# Patient Record
Sex: Female | Born: 1975 | Race: White | Hispanic: No | Smoking: Former smoker
Health system: Southern US, Community
[De-identification: ages and names within clinical notes are randomized; demographics above are authoritative.]

## PROBLEM LIST (undated history)

## (undated) DIAGNOSIS — N189 Chronic kidney disease, unspecified: Secondary | ICD-10-CM

## (undated) DIAGNOSIS — N200 Calculus of kidney: Secondary | ICD-10-CM

## (undated) DIAGNOSIS — E538 Deficiency of other specified B group vitamins: Secondary | ICD-10-CM

## (undated) DIAGNOSIS — R519 Headache, unspecified: Secondary | ICD-10-CM

## (undated) DIAGNOSIS — F419 Anxiety disorder, unspecified: Secondary | ICD-10-CM

## (undated) DIAGNOSIS — Z87891 Personal history of nicotine dependence: Secondary | ICD-10-CM

## (undated) DIAGNOSIS — J40 Bronchitis, not specified as acute or chronic: Secondary | ICD-10-CM

## (undated) DIAGNOSIS — K219 Gastro-esophageal reflux disease without esophagitis: Secondary | ICD-10-CM

## (undated) DIAGNOSIS — F32A Depression, unspecified: Secondary | ICD-10-CM

## (undated) DIAGNOSIS — L732 Hidradenitis suppurativa: Secondary | ICD-10-CM

## (undated) DIAGNOSIS — R7989 Other specified abnormal findings of blood chemistry: Secondary | ICD-10-CM

## (undated) DIAGNOSIS — E559 Vitamin D deficiency, unspecified: Secondary | ICD-10-CM

## (undated) DIAGNOSIS — F329 Major depressive disorder, single episode, unspecified: Secondary | ICD-10-CM

## (undated) DIAGNOSIS — R51 Headache: Secondary | ICD-10-CM

## (undated) HISTORY — PX: KIDNEY STONE SURGERY: SHX686

## (undated) HISTORY — DX: Personal history of nicotine dependence: Z87.891

## (undated) HISTORY — DX: Hidradenitis suppurativa: L73.2

## (undated) HISTORY — DX: Vitamin D deficiency, unspecified: E55.9

## (undated) HISTORY — DX: Other specified abnormal findings of blood chemistry: R79.89

## (undated) HISTORY — PX: CHOLECYSTECTOMY: SHX55

## (undated) HISTORY — DX: Deficiency of other specified B group vitamins: E53.8

## (undated) HISTORY — DX: Depression, unspecified: F32.A

## (undated) HISTORY — DX: Bronchitis, not specified as acute or chronic: J40

---
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
## 1994-06-19 HISTORY — PX: OOPHORECTOMY: SHX86

## 1997-10-26 ENCOUNTER — Emergency Department (HOSPITAL_COMMUNITY): Admission: EM | Admit: 1997-10-26 | Discharge: 1997-10-26 | Payer: Self-pay | Admitting: Emergency Medicine

## 2000-10-28 ENCOUNTER — Emergency Department (HOSPITAL_COMMUNITY): Admission: EM | Admit: 2000-10-28 | Discharge: 2000-10-28 | Payer: Self-pay | Admitting: Emergency Medicine

## 2000-12-26 ENCOUNTER — Emergency Department (HOSPITAL_COMMUNITY): Admission: EM | Admit: 2000-12-26 | Discharge: 2000-12-26 | Payer: Self-pay | Admitting: Internal Medicine

## 2002-05-18 ENCOUNTER — Emergency Department (HOSPITAL_COMMUNITY): Admission: EM | Admit: 2002-05-18 | Discharge: 2002-05-18 | Payer: Self-pay | Admitting: Emergency Medicine

## 2002-08-15 ENCOUNTER — Emergency Department (HOSPITAL_COMMUNITY): Admission: EM | Admit: 2002-08-15 | Discharge: 2002-08-15 | Payer: Self-pay | Admitting: Emergency Medicine

## 2003-02-16 ENCOUNTER — Emergency Department (HOSPITAL_COMMUNITY): Admission: EM | Admit: 2003-02-16 | Discharge: 2003-02-16 | Payer: Self-pay | Admitting: Emergency Medicine

## 2003-08-10 ENCOUNTER — Emergency Department (HOSPITAL_COMMUNITY): Admission: EM | Admit: 2003-08-10 | Discharge: 2003-08-10 | Payer: Self-pay | Admitting: Emergency Medicine

## 2003-09-17 ENCOUNTER — Emergency Department (HOSPITAL_COMMUNITY): Admission: EM | Admit: 2003-09-17 | Discharge: 2003-09-17 | Payer: Self-pay | Admitting: Emergency Medicine

## 2003-11-21 ENCOUNTER — Emergency Department (HOSPITAL_COMMUNITY): Admission: EM | Admit: 2003-11-21 | Discharge: 2003-11-21 | Payer: Self-pay | Admitting: Emergency Medicine

## 2004-09-27 ENCOUNTER — Emergency Department (HOSPITAL_COMMUNITY): Admission: EM | Admit: 2004-09-27 | Discharge: 2004-09-27 | Payer: Self-pay | Admitting: Emergency Medicine

## 2005-03-15 ENCOUNTER — Emergency Department (HOSPITAL_COMMUNITY): Admission: EM | Admit: 2005-03-15 | Discharge: 2005-03-15 | Payer: Self-pay | Admitting: Emergency Medicine

## 2006-05-19 ENCOUNTER — Emergency Department (HOSPITAL_COMMUNITY): Admission: EM | Admit: 2006-05-19 | Discharge: 2006-05-19 | Payer: Self-pay | Admitting: Emergency Medicine

## 2007-01-04 ENCOUNTER — Emergency Department (HOSPITAL_COMMUNITY): Admission: EM | Admit: 2007-01-04 | Discharge: 2007-01-04 | Payer: Self-pay | Admitting: Emergency Medicine

## 2007-04-19 ENCOUNTER — Ambulatory Visit: Payer: Self-pay | Admitting: Internal Medicine

## 2007-05-31 ENCOUNTER — Ambulatory Visit: Payer: Self-pay | Admitting: Internal Medicine

## 2008-02-10 ENCOUNTER — Ambulatory Visit: Payer: Self-pay | Admitting: Unknown Physician Specialty

## 2008-03-26 ENCOUNTER — Emergency Department (HOSPITAL_COMMUNITY): Admission: EM | Admit: 2008-03-26 | Discharge: 2008-03-26 | Payer: Self-pay | Admitting: Emergency Medicine

## 2008-06-12 ENCOUNTER — Emergency Department: Payer: Self-pay | Admitting: Unknown Physician Specialty

## 2008-06-12 ENCOUNTER — Emergency Department (HOSPITAL_COMMUNITY): Admission: EM | Admit: 2008-06-12 | Discharge: 2008-06-12 | Payer: Self-pay | Admitting: Emergency Medicine

## 2008-07-15 ENCOUNTER — Emergency Department (HOSPITAL_COMMUNITY): Admission: EM | Admit: 2008-07-15 | Discharge: 2008-07-15 | Payer: Self-pay | Admitting: Emergency Medicine

## 2008-09-04 ENCOUNTER — Emergency Department: Payer: Self-pay | Admitting: Emergency Medicine

## 2008-11-13 ENCOUNTER — Emergency Department: Payer: Self-pay | Admitting: Emergency Medicine

## 2009-06-04 ENCOUNTER — Emergency Department: Payer: Self-pay | Admitting: Unknown Physician Specialty

## 2009-07-24 ENCOUNTER — Emergency Department (HOSPITAL_COMMUNITY): Admission: EM | Admit: 2009-07-24 | Discharge: 2009-07-24 | Payer: Self-pay | Admitting: Emergency Medicine

## 2009-08-12 ENCOUNTER — Encounter: Payer: Self-pay | Admitting: Unknown Physician Specialty

## 2009-08-17 ENCOUNTER — Encounter: Payer: Self-pay | Admitting: Unknown Physician Specialty

## 2010-01-20 ENCOUNTER — Emergency Department: Payer: Self-pay | Admitting: Internal Medicine

## 2010-07-20 ENCOUNTER — Emergency Department: Payer: Self-pay | Admitting: Emergency Medicine

## 2010-09-07 LAB — URINALYSIS, ROUTINE W REFLEX MICROSCOPIC
Ketones, ur: NEGATIVE mg/dL
Leukocytes, UA: NEGATIVE
Protein, ur: NEGATIVE mg/dL
Specific Gravity, Urine: 1.015 (ref 1.005–1.030)
pH: 7 (ref 5.0–8.0)

## 2010-09-07 LAB — WET PREP, GENITAL: Trich, Wet Prep: NONE SEEN

## 2010-09-07 LAB — URINE MICROSCOPIC-ADD ON

## 2010-09-07 LAB — GC/CHLAMYDIA PROBE AMP, GENITAL
Chlamydia, DNA Probe: NEGATIVE
GC Probe Amp, Genital: NEGATIVE

## 2010-10-03 LAB — RAPID STREP SCREEN (MED CTR MEBANE ONLY): Streptococcus, Group A Screen (Direct): NEGATIVE

## 2010-10-04 ENCOUNTER — Emergency Department: Payer: Self-pay | Admitting: Unknown Physician Specialty

## 2011-03-20 LAB — URINALYSIS, ROUTINE W REFLEX MICROSCOPIC
Bilirubin Urine: NEGATIVE
Nitrite: NEGATIVE
Specific Gravity, Urine: 1.025
Urobilinogen, UA: 0.2

## 2011-03-20 LAB — PREGNANCY, URINE: Preg Test, Ur: NEGATIVE

## 2011-09-22 ENCOUNTER — Emergency Department: Payer: Self-pay | Admitting: Emergency Medicine

## 2012-06-04 ENCOUNTER — Ambulatory Visit: Payer: Self-pay | Admitting: Family Medicine

## 2012-08-20 ENCOUNTER — Ambulatory Visit: Payer: Self-pay | Admitting: Family Medicine

## 2012-08-30 ENCOUNTER — Ambulatory Visit: Payer: Self-pay | Admitting: Family Medicine

## 2012-11-15 ENCOUNTER — Ambulatory Visit: Payer: Self-pay | Admitting: Family Medicine

## 2013-01-13 ENCOUNTER — Ambulatory Visit (INDEPENDENT_AMBULATORY_CARE_PROVIDER_SITE_OTHER): Payer: Commercial Managed Care - PPO | Admitting: Family Medicine

## 2013-01-13 ENCOUNTER — Ambulatory Visit: Payer: Self-pay | Admitting: Family Medicine

## 2013-01-13 ENCOUNTER — Encounter: Payer: Self-pay | Admitting: Family Medicine

## 2013-01-13 VITALS — BP 120/74 | HR 76 | Temp 98.2°F | Ht 63.25 in | Wt 241.0 lb

## 2013-01-13 DIAGNOSIS — L732 Hidradenitis suppurativa: Secondary | ICD-10-CM

## 2013-01-13 DIAGNOSIS — N644 Mastodynia: Secondary | ICD-10-CM

## 2013-01-13 DIAGNOSIS — E559 Vitamin D deficiency, unspecified: Secondary | ICD-10-CM

## 2013-01-13 NOTE — Progress Notes (Signed)
Subjective:    Patient ID: Tanya Henderson, female    DOB: 27-Nov-1975, 37 y.o.   MRN: [id number]  HPI  Very pleasant 37 yo G1P1 here to establish care.  She goes to [facility] (GYN).  Has implanon-  due to be changed early next year.  Vit D deficiency- per pt, h/o very low Vit D.  Just now finishing weekly high dose supplementation and would like it rechecked.  Fatigue has improved.  Right breast pain- on and off for two weeks.  She does not think she feels a mass but breast is tender to palpation. No changes in her nipple or nipple discharge.  No immediate family h/o breast CA- great aunts may have had breast CA.  Abscesses in groin- gets them every so often.  Has been to derm.  Told it was acne.  Can become very painful- can scar.  She thinks it is where her thighs rub together. Has been on oral abx in past for this.  Does not have any active lesions.  Per pt, had CPX at GYN earlier this year- labs done (awaiting records).  Patient Active Problem List   Diagnosis Date Noted  . Unspecified vitamin D deficiency 01/13/2013  . Breast pain, right 01/13/2013  . Hidradenitis 01/13/2013   Past Medical History  Diagnosis Date  . Vitamin D deficiency    Past Surgical History  Procedure Laterality Date  . Cholecystectomy    . Oophorectomy Right 1996    Ovarian cyst   History  Substance Use Topics  . Smoking status: Current Every Day Smoker  . Smokeless tobacco: Not on file  . Alcohol Use: Not on file   No family history on file. Allergies  Allergen Reactions  . Penicillins     Allergy as a child, unsure of reaction   No current outpatient prescriptions on file prior to visit.   No current facility-administered medications on file prior to visit.   The PMH, PSH, Social History, Family History, Medications, and allergies have been reviewed in [facility], and have been updated if relevant.   Review of Systems    See HPI Denies CP or SOB + occasional menstrual  cramping but does not have periods with implanon Objective:   Physical Exam BP 120/74  Pulse 76  Temp(Src) 98.2 F (36.8 C)  Ht 5' 3.25" (1.607 m)  Wt 241 lb (109.317 kg)  BMI 42.33 kg/m2  General:  Well-developed,well-nourished,in no acute distress; alert,appropriate and cooperative throughout examination Head:  normocephalic and atraumatic.   Eyes:  vision grossly intact, pupils equal, pupils round, and pupils reactive to light.   Ears:  R ear normal and L ear normal.   Nose:  no external deformity.   Mouth:  good dentition.   Neck:  No deformities, masses, or tenderness noted. Breasts:  No mass, nodules, thickening, tenderness, bulging, retraction, inflamation, nipple discharge or skin changes noted.   Right chest wall a little TTP Lungs:  Normal respiratory effort, chest expands symmetrically. Lungs are clear to auscultation, no crackles or wheezes. Heart:  Normal rate and regular rhythm. S1 and S2 normal without gallop, murmur, click, rub or other extra sounds. Extremities:  No clubbing, cyanosis, edema, or deformity noted with normal full range of motion of all joints.   Neurologic:  alert & oriented X3 and gait normal.   Skin:  Intact without suspicious lesions or rashes Cervical Nodes:  No lymphadenopathy noted Axillary Nodes:  No palpable lymphadenopathy Psych:  Cognition and judgment appear  intact. Alert and cooperative with normal attention span and concentration. No apparent delusions, illusions, hallucinations      Assessment & Plan:  1. Unspecified vitamin D deficiency Recheck labs today. - Vitamin D, 25-hydroxy  2. Breast pain, right I suspect this is costochondritis/MSK discomfort but will order bilateral diagnostic mammogram to rule out breast pathology. The patient indicates understanding of these issues and agrees with the plan.  - MM Digital Diagnostic Bilat; Future  3. Hidradenitis No current lesions. See AVS for supportive care.

## 2013-01-13 NOTE — Addendum Note (Signed)
Addended by: Dianne Dun on: 01/13/2013 02:44 PM   Modules accepted: Orders

## 2013-01-13 NOTE — Patient Instructions (Addendum)
Good to see you. We will call you with your Vit D results.  Please stop by to see Shirlee Limerick on your way out to set up your referral.   Hidradenitis Suppurativa, Sweat Gland Abscess Hidradenitis suppurativa is a long lasting (chronic), uncommon disease of the sweat glands. With this, boil-like lumps and scarring develop in the groin, some times under the arms (axillae), and under the breasts. It may also uncommonly occur behind the ears, in the crease of the buttocks, and around the genitals.  CAUSES  The cause is from a blocking of the sweat glands. They then become infected. It may cause drainage and odor. It is not contagious. So it cannot be given to someone else. It most often shows up in puberty (about 4 to 38 years of age). But it may happen much later. It is similar to acne which is a disease of the sweat glands. This condition is slightly more common in African-Americans and women. SYMPTOMS   Hidradenitis usually starts as one or more red, tender, swellings in the groin or under the arms (axilla).  Over a period of hours to days the lesions get larger. They often open to the skin surface, draining clear to yellow-colored fluid.  The infected area heals with scarring. DIAGNOSIS  Your caregiver makes this diagnosis by looking at you. Sometimes cultures (growing germs on plates in the lab) may be taken. This is to see what germ (bacterium) is causing the infection.  TREATMENT   Topical germ killing medicine applied to the skin (antibiotics) are the treatment of choice. Antibiotics taken by mouth (systemic) are sometimes needed when the condition is getting worse or is severe.  Avoid tight-fitting clothing which traps moisture in.  Dirt does not cause hidradenitis and it is not caused by poor hygiene.  Involved areas should be cleaned daily using an antibacterial soap. Some patients find that the liquid form of Lever 2000, applied to the involved areas as a lotion after bathing, can  help reduce the odor related to this condition.  Sometimes surgery is needed to drain infected areas or remove scarred tissue. Removal of large amounts of tissue is used only in severe cases.  Birth control pills may be helpful.  Oral retinoids (vitamin A derivatives) for 6 to 12 months which are effective for acne may also help this condition.  Weight loss will improve but not cure hidradenitis. It is made worse by being overweight. But the condition is not caused by being overweight.  This condition is more common in people who have had acne.  It may become worse under stress. There is no medical cure for hidradenitis. It can be controlled, but not cured. The condition usually continues for years with periods of getting worse and getting better (remission). Document Released: 01/18/2004 Document Revised: 08/28/2011 Document Reviewed: 02/03/2008 [facility] Patient Information 2014 [place], [place].

## 2013-01-14 ENCOUNTER — Encounter: Payer: Self-pay | Admitting: Family Medicine

## 2013-01-14 ENCOUNTER — Ambulatory Visit: Payer: Self-pay | Admitting: Family Medicine

## 2013-01-15 ENCOUNTER — Encounter: Payer: Self-pay | Admitting: Family Medicine

## 2013-01-16 ENCOUNTER — Encounter: Payer: Self-pay | Admitting: Family Medicine

## 2013-01-20 ENCOUNTER — Other Ambulatory Visit: Payer: Self-pay | Admitting: Family Medicine

## 2013-01-20 DIAGNOSIS — R928 Other abnormal and inconclusive findings on diagnostic imaging of breast: Secondary | ICD-10-CM

## 2013-01-21 ENCOUNTER — Telehealth: Payer: Self-pay | Admitting: *Deleted

## 2013-01-21 NOTE — Telephone Encounter (Signed)
Advised patient as instructed. 

## 2013-01-21 NOTE — Telephone Encounter (Signed)
Agreed.  They felt it was a benign finding but wanted close follow up.  The more she touches the area, the more sore it will become.

## 2013-01-21 NOTE — Telephone Encounter (Signed)
Spoke with patient regarding her mammogram.  She's concerned that she will not have follow up scan until 3 or 4 months from now.  She states they told her that we would need to refer her if she needs scan before then.  I told her that they are allowing time to see if nodes increase or decrease in size, that it may take that long for any changes to be seen.  She also reports that the area is more painful, tender now. I suggested that it may be that she is just more aware of the pain now and she may be stressing more about it.

## 2013-01-30 ENCOUNTER — Ambulatory Visit: Payer: Commercial Managed Care - PPO | Admitting: Family Medicine

## 2013-03-24 ENCOUNTER — Emergency Department: Payer: Self-pay | Admitting: Emergency Medicine

## 2013-03-24 LAB — CBC
HCT: 43.6 % (ref 35.0–47.0)
MCV: 89 fL (ref 80–100)
Platelet: 281 10*3/uL (ref 150–440)
RBC: 4.89 10*6/uL (ref 3.80–5.20)

## 2013-03-24 LAB — MONONUCLEOSIS SCREEN: Mono Test: NEGATIVE

## 2013-04-24 ENCOUNTER — Other Ambulatory Visit: Payer: Self-pay

## 2013-05-27 ENCOUNTER — Encounter: Payer: Self-pay | Admitting: Internal Medicine

## 2013-05-27 ENCOUNTER — Ambulatory Visit (INDEPENDENT_AMBULATORY_CARE_PROVIDER_SITE_OTHER): Payer: Commercial Managed Care - PPO | Admitting: Internal Medicine

## 2013-05-27 VITALS — BP 120/90 | HR 94 | Temp 98.0°F | Wt 237.0 lb

## 2013-05-27 DIAGNOSIS — B379 Candidiasis, unspecified: Secondary | ICD-10-CM

## 2013-05-27 DIAGNOSIS — J029 Acute pharyngitis, unspecified: Secondary | ICD-10-CM

## 2013-05-27 MED ORDER — FLUCONAZOLE 150 MG PO TABS: 150.0000 mg | ORAL_TABLET | Freq: Every day | ORAL | Status: DC

## 2013-05-27 MED ORDER — CEFDINIR 300 MG PO CAPS: 300.0000 mg | ORAL_CAPSULE | Freq: Two times a day (BID) | ORAL | Status: DC

## 2013-05-27 NOTE — Patient Instructions (Signed)

## 2013-05-27 NOTE — Progress Notes (Signed)
Pre-visit discussion using our clinic review tool. No additional management support is needed unless otherwise documented below in the visit note.  

## 2013-05-27 NOTE — Progress Notes (Signed)
HPI  Pt presents to the clinic today with c/o right ear pain and swollen tonsil R>L. This started about 1 week ago. This has been happening on and off since October. She tested Strep A and Mono. She denies fever, chills or body aches. She has tried Ibuprofen OTC which has helped.  Review of Systems      Past Medical History  Diagnosis Date  . Vitamin D deficiency     History reviewed. No pertinent family history.  History   Social History  . Marital Status: Legally Separated    Spouse Name: N/A    Number of Children: N/A  . Years of Education: N/A   Occupational History  . Not on file.   Social History Main Topics  . Smoking status: Current Every Day Smoker  . Smokeless tobacco: Never Used  . Alcohol Use: Yes  . Drug Use: No  . Sexual Activity: Not on file   Other Topics Concern  . 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    Allergies  Allergen Reactions  . Penicillins     Allergy as a child, unsure of reaction     Constitutional: Positive headache, fatigue. Denies fever or abrupt weight changes.  HEENT:  Positive sore throat. Denies eye redness, eye pain, pressure behind the eyes, facial pain, nasal congestion, ear pain, ringing in the ears, wax buildup, runny nose or bloody nose. Respiratory:  Denies cough, difficulty breathing or shortness of breath.  Cardiovascular: Denies chest pain, chest tightness, palpitations or swelling in the hands or feet.   No other specific complaints in a complete review of systems (except as listed in HPI above).  Objective:   BP 120/90  Pulse 94  Temp(Src) 98 F (36.7 C) (Tympanic)  Wt 237 lb (107.502 kg)  SpO2 98% Wt Readings from Last 3 Encounters:  05/27/13 237 lb (107.502 kg)  01/13/13 241 lb (109.317 kg)     General: Appears her stated age, well developed, well nourished in NAD. HEENT: Head: normal shape and size; Eyes: sclera white, no icterus, conjunctiva pink, PERRLA and  EOMs intact; Ears: Tm's gray and intact, normal light reflex; Nose: mucosa pink and moist, septum midline; Throat/Mouth: + PND. Teeth present, mucosa erythematous and moist, white patchy exudate noted on bilateral tonsillar pillars, no lesions or ulcerations noted.  Neck: Mild tonsillar lymphadenopathy. Neck supple, trachea midline. No massses, lumps or thyromegaly present.  Cardiovascular: Normal rate and rhythm. S1,S2 noted.  No murmur, rubs or gallops noted. No JVD or BLE edema. No carotid bruits noted. Pulmonary/Chest: Normal effort and positive vesicular breath sounds. No respiratory distress. No wheezes, rales or ronchi noted.      Assessment & Plan:   Acute pharyngitis, likely bacterial:  Get some rest and drink plenty of water Do salt water gargles for the sore throat eRx for Omnicef BID x 10 days eRx for diflucan 150 mg for antibiotic induced yeast infection  RTC as needed or if symptoms persist.

## 2013-05-28 ENCOUNTER — Encounter: Payer: Self-pay | Admitting: Family Medicine

## 2013-05-28 ENCOUNTER — Ambulatory Visit: Payer: Self-pay | Admitting: Family Medicine

## 2013-06-03 ENCOUNTER — Telehealth: Payer: Self-pay

## 2013-06-03 NOTE — Telephone Encounter (Signed)
Pt left v/m was seen 05/27/13 and omnicef was given;white spots are coming back on tonsils and pt request Zpak sent to [facility]. Pt request cb.

## 2013-06-03 NOTE — Telephone Encounter (Signed)
zpack typically will not treat pharyngitis. If she is not  Better she needs to be reevaluated

## 2013-06-04 NOTE — Telephone Encounter (Signed)
spoke to pt and she made a f/u appt for 12/19

## 2013-06-06 ENCOUNTER — Ambulatory Visit (INDEPENDENT_AMBULATORY_CARE_PROVIDER_SITE_OTHER): Payer: Commercial Managed Care - PPO | Admitting: Internal Medicine

## 2013-06-06 ENCOUNTER — Encounter: Payer: Self-pay | Admitting: Internal Medicine

## 2013-06-06 VITALS — BP 130/72 | HR 98 | Temp 98.3°F | Ht 64.0 in | Wt 235.8 lb

## 2013-06-06 DIAGNOSIS — L259 Unspecified contact dermatitis, unspecified cause: Secondary | ICD-10-CM

## 2013-06-06 DIAGNOSIS — J029 Acute pharyngitis, unspecified: Secondary | ICD-10-CM

## 2013-06-06 DIAGNOSIS — L239 Allergic contact dermatitis, unspecified cause: Secondary | ICD-10-CM

## 2013-06-06 MED ORDER — TRIAMCINOLONE ACETONIDE 0.1 % EX CREA: 1.0000 "application " | TOPICAL_CREAM | Freq: Two times a day (BID) | CUTANEOUS | Status: DC

## 2013-06-06 MED ORDER — PREDNISONE 10 MG PO TABS: ORAL_TABLET | ORAL | Status: DC

## 2013-06-06 NOTE — Progress Notes (Signed)
Subjective:    Patient ID: Tanya Henderson, female    DOB: Mar 28, 1976, 37 y.o.   MRN: [id number]  HPI  Pt presents to the clinic today with c/o continued white spots on her right tonsils, continued swelling and intermittent pain. She did take her entire course of omnicef. She denies fever, chills or body aches. She has not taken anything in addition to the antibiotic. She seems to get frequent tonsillar infections. She has not been evaluated by ENT. She also c/o a rash in bilateral groins and around her labia. She did develop a yeast infection while on the antibiotic. She did take the diflucan and the discharge went away but she still reports some irritation around the labia. She has not put anything on the rash on her groin. She reports that it looks to her like an allergic reaction.  Review of Systems      Past Medical History  Diagnosis Date  . Vitamin D deficiency     Current Outpatient Prescriptions  Medication Sig Dispense Refill  . cefdinir (OMNICEF) 300 MG capsule Take 1 capsule (300 mg total) by mouth 2 (two) times daily.  20 capsule  0  . fluconazole (DIFLUCAN) 150 MG tablet Take 1 tablet (150 mg total) by mouth daily.  1 tablet  0   No current facility-administered medications for this visit.    Allergies  Allergen Reactions  . Penicillins     Allergy as a child, unsure of reaction    History reviewed. No pertinent family history.  History   Social History  . Marital Status: Legally Separated    Spouse Name: N/A    Number of Children: N/A  . Years of Education: N/A   Occupational History  . Not on file.   Social History Main Topics  . Smoking status: Current Every Day Smoker  . Smokeless tobacco: Never Used  . Alcohol Use: Yes  . Drug Use: No  . Sexual Activity: Not on file   Other Topics Concern  . 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     Constitutional: Denies fever, malaise, fatigue, headache  or abrupt weight changes.  HEENT: Denies eye pain, eye redness, ear pain, ringing in the ears, wax buildup, runny nose, nasal congestion, bloody nose. GU: Denies urgency, frequency, pain with urination, burning sensation, blood in urine, odor or discharge. Skin: Denies redness,  lesions or ulcercations.    No other specific complaints in a complete review of systems (except as listed in HPI above).  Objective:   Physical Exam  BP 130/72  Pulse 98  Temp(Src) 98.3 F (36.8 C) (Tympanic)  Ht 5\' 4"  (1.626 m)  Wt 235 lb 12 oz (106.935 kg)  BMI 40.45 kg/m2  SpO2 99% Wt Readings from Last 3 Encounters:  06/06/13 235 lb 12 oz (106.935 kg)  05/27/13 237 lb (107.502 kg)  01/13/13 241 lb (109.317 kg)    General: Appears her stated age, obese but well developed, well nourished in NAD. Skin: Warm, dry and intact. No lesions or ulcerations noted. Allergic dermatitis noted in bilateral groins. Cardiovascular: Normal rate and rhythm. S1,S2 noted.  No murmur, rubs or gallops noted. No JVD or BLE edema. No carotid bruits noted. Pulmonary/Chest: Normal effort and positive vesicular breath sounds. No respiratory distress. No wheezes, rales or ronchi noted.  GU:  Inner labia noted to be paler than outer labia. This is new per pt. No  lesions, ulcerations, irritation or discharge noted.     Assessment & Plan:   Pharyngitis, unresolved:  Finishing up Omnicef at this time Will try pred taper to help with the inflammation If this is not successful, will refer you to ENT for further evaluation  Rash, bilateral groins:  Not candida Looks to be dermatitis Take a benadryl OTC I will give you some triamcinolone cream to apply to the affected area  RTC as needed or if symptoms persist or worsen

## 2013-06-06 NOTE — Progress Notes (Signed)
Pre-visit discussion using our clinic review tool. No additional management support is needed unless otherwise documented below in the visit note.  

## 2013-06-06 NOTE — Patient Instructions (Addendum)

## 2013-06-18 ENCOUNTER — Ambulatory Visit: Payer: Commercial Managed Care - PPO | Admitting: Internal Medicine

## 2013-07-11 ENCOUNTER — Ambulatory Visit (INDEPENDENT_AMBULATORY_CARE_PROVIDER_SITE_OTHER): Payer: Commercial Managed Care - PPO | Admitting: Internal Medicine

## 2013-07-11 ENCOUNTER — Encounter: Payer: Self-pay | Admitting: Internal Medicine

## 2013-07-11 VITALS — BP 126/82 | HR 82 | Temp 98.4°F | Wt 240.2 lb

## 2013-07-11 DIAGNOSIS — N898 Other specified noninflammatory disorders of vagina: Secondary | ICD-10-CM

## 2013-07-11 DIAGNOSIS — N760 Acute vaginitis: Secondary | ICD-10-CM

## 2013-07-11 DIAGNOSIS — B9689 Other specified bacterial agents as the cause of diseases classified elsewhere: Secondary | ICD-10-CM

## 2013-07-11 DIAGNOSIS — A499 Bacterial infection, unspecified: Secondary | ICD-10-CM

## 2013-07-11 LAB — POCT WET PREP (WET MOUNT): CLUE CELLS WET PREP WHIFF POC: POSITIVE

## 2013-07-11 NOTE — Progress Notes (Signed)
Pre-visit discussion using our clinic review tool. No additional management support is needed unless otherwise documented below in the visit note.  

## 2013-07-11 NOTE — Patient Instructions (Signed)

## 2013-07-11 NOTE — Progress Notes (Signed)
   Subjective:    Patient ID: Tanya Henderson, female    DOB: 03/26/1976, 38 y.o.   MRN: [id number]  HPI  Pt presents to the clinic today with c/o discharge and odor. This started 2 days ago. The discharge is thin and white. She has had some lower abdominal cramping but denies any burning or itching. She did go to GYN yesterday to have her Nexplanon replaced yesterday. She did tell her GYN about the discharge and she went ahead and called her in some Metrogel. She has not picked it up because she did not want to take something without knowing for sure what it is.  Review of Systems      Past Medical History  Diagnosis Date  . Vitamin D deficiency     Current Outpatient Prescriptions  Medication Sig Dispense Refill  . triamcinolone cream (KENALOG) 0.1 % Apply 1 application topically 2 (two) times daily.  30 g  0   No current facility-administered medications for this visit.    Allergies  Allergen Reactions  . Penicillins     Allergy as a child, unsure of reaction    No family history on file.  History   Social History  . Marital Status: Legally Separated    Spouse Name: N/A    Number of Children: N/A  . Years of Education: N/A   Occupational History  . Not on file.   Social History Main Topics  . Smoking status: Current Every Day Smoker  . Smokeless tobacco: Never Used  . Alcohol Use: Yes  . Drug Use: No  . Sexual Activity: Not on file   Other Topics Concern  . 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     Constitutional: Denies fever, malaise, fatigue, headache or abrupt weight changes.  GU: Pt reports vaginal discharge with odor. Denies urgency, frequency, pain with urination, burning sensation, blood in urine.   No other specific complaints in a complete review of systems (except as listed in HPI above).  Objective:   Physical Exam  BP 126/82  Pulse 82  Temp(Src) 98.4 F (36.9 C) (Oral)  Wt 240 lb 4 oz  (108.977 kg)  SpO2 98% Wt Readings from Last 3 Encounters:  07/11/13 240 lb 4 oz (108.977 kg)  06/06/13 235 lb 12 oz (106.935 kg)  05/27/13 237 lb (107.502 kg)    General: Appears her stated age, well developed, well nourished in NAD. Cardiovascular: Normal rate and rhythm. S1,S2 noted.  No murmur, rubs or gallops noted. No JVD or BLE edema. No carotid bruits noted. Pulmonary/Chest: Normal effort and positive vesicular breath sounds. No respiratory distress. No wheezes, rales or ronchi noted.  Abdomen: Soft and nontender. Normal bowel sounds, no bruits noted. No distention or masses noted. Liver, spleen and kidneys non palpable. Pelvic: Small amount of thin white discharge noted, negative whiff.      Assessment & Plan:   Vaginal discharge with odor secondary to BV:  Wet prep obtained: + clue cells, negative yeast or trich Metrogel has already been ordered by GYN Use as directed and RTC if you continue to have issues/concerns

## 2013-07-14 ENCOUNTER — Telehealth: Payer: Self-pay | Admitting: Family Medicine

## 2013-07-14 NOTE — Telephone Encounter (Signed)
Relevant patient education assigned to patient using Emmi. ° °

## 2013-12-16 DIAGNOSIS — F32A Depression, unspecified: Secondary | ICD-10-CM

## 2013-12-16 DIAGNOSIS — K219 Gastro-esophageal reflux disease without esophagitis: Secondary | ICD-10-CM | POA: Insufficient documentation

## 2013-12-16 DIAGNOSIS — G43909 Migraine, unspecified, not intractable, without status migrainosus: Secondary | ICD-10-CM

## 2013-12-16 DIAGNOSIS — K8020 Calculus of gallbladder without cholecystitis without obstruction: Secondary | ICD-10-CM

## 2013-12-16 DIAGNOSIS — F329 Major depressive disorder, single episode, unspecified: Secondary | ICD-10-CM | POA: Insufficient documentation

## 2013-12-16 HISTORY — DX: Calculus of gallbladder without cholecystitis without obstruction: K80.20

## 2013-12-16 HISTORY — DX: Migraine, unspecified, not intractable, without status migrainosus: G43.909

## 2013-12-16 HISTORY — DX: Gastro-esophageal reflux disease without esophagitis: K21.9

## 2013-12-16 HISTORY — DX: Depression, unspecified: F32.A

## 2014-08-10 ENCOUNTER — Encounter: Payer: Commercial Managed Care - PPO | Admitting: Family Medicine

## 2014-10-14 ENCOUNTER — Other Ambulatory Visit: Payer: Self-pay | Admitting: Obstetrics and Gynecology

## 2014-10-14 DIAGNOSIS — Z1231 Encounter for screening mammogram for malignant neoplasm of breast: Secondary | ICD-10-CM

## 2014-11-25 ENCOUNTER — Ambulatory Visit: Payer: Commercial Managed Care - PPO | Attending: Obstetrics and Gynecology

## 2015-01-28 ENCOUNTER — Emergency Department
Admission: EM | Admit: 2015-01-28 | Discharge: 2015-01-29 | Disposition: A | Discharge status: Home / Self Care | Payer: Commercial Indemnity | Attending: Emergency Medicine | Admitting: Emergency Medicine

## 2015-01-28 DIAGNOSIS — Z6841 Body Mass Index (BMI) 40.0 and over, adult: Secondary | ICD-10-CM | POA: Diagnosis not present

## 2015-01-28 DIAGNOSIS — Q512 Other doubling of uterus: Secondary | ICD-10-CM | POA: Diagnosis not present

## 2015-01-28 DIAGNOSIS — E669 Obesity, unspecified: Secondary | ICD-10-CM | POA: Insufficient documentation

## 2015-01-28 DIAGNOSIS — R112 Nausea with vomiting, unspecified: Secondary | ICD-10-CM

## 2015-01-28 DIAGNOSIS — N132 Hydronephrosis with renal and ureteral calculous obstruction: Secondary | ICD-10-CM | POA: Diagnosis not present

## 2015-01-28 DIAGNOSIS — R109 Unspecified abdominal pain: Secondary | ICD-10-CM | POA: Insufficient documentation

## 2015-01-28 DIAGNOSIS — Z90721 Acquired absence of ovaries, unilateral: Secondary | ICD-10-CM | POA: Diagnosis not present

## 2015-01-28 DIAGNOSIS — N201 Calculus of ureter: Secondary | ICD-10-CM

## 2015-01-28 DIAGNOSIS — E559 Vitamin D deficiency, unspecified: Secondary | ICD-10-CM | POA: Insufficient documentation

## 2015-01-28 DIAGNOSIS — N200 Calculus of kidney: Secondary | ICD-10-CM

## 2015-01-28 DIAGNOSIS — Z9049 Acquired absence of other specified parts of digestive tract: Secondary | ICD-10-CM | POA: Insufficient documentation

## 2015-01-28 DIAGNOSIS — F17200 Nicotine dependence, unspecified, uncomplicated: Secondary | ICD-10-CM | POA: Diagnosis not present

## 2015-01-28 DIAGNOSIS — R1084 Generalized abdominal pain: Secondary | ICD-10-CM | POA: Diagnosis present

## 2015-01-28 DIAGNOSIS — L732 Hidradenitis suppurativa: Secondary | ICD-10-CM | POA: Diagnosis not present

## 2015-01-28 LAB — URINALYSIS COMPLETE WITH MICROSCOPIC (ARMC ONLY)
Bilirubin Urine: NEGATIVE
Glucose, UA: NEGATIVE mg/dL
Leukocytes, UA: NEGATIVE
Nitrite: NEGATIVE
PH: 5 (ref 5.0–8.0)
Protein, ur: 30 mg/dL — AB
Specific Gravity, Urine: 1.02 (ref 1.005–1.030)
WBC UA: NONE SEEN WBC/hpf (ref 0–5)

## 2015-01-28 LAB — COMPREHENSIVE METABOLIC PANEL
ALK PHOS: 104 U/L (ref 38–126)
ALT: 46 U/L (ref 14–54)
ANION GAP: 9 (ref 5–15)
AST: 32 U/L (ref 15–41)
Albumin: 4.2 g/dL (ref 3.5–5.0)
BILIRUBIN TOTAL: 0.5 mg/dL (ref 0.3–1.2)
BUN: 12 mg/dL (ref 6–20)
CHLORIDE: 103 mmol/L (ref 101–111)
CO2: 23 mmol/L (ref 22–32)
Calcium: 8.9 mg/dL (ref 8.9–10.3)
Creatinine, Ser: 0.86 mg/dL (ref 0.44–1.00)
GFR calc non Af Amer: >60 mL/min (ref >60)
GLUCOSE: 133 mg/dL — AB (ref 65–99)
Potassium: 3.8 mmol/L (ref 3.5–5.1)
Sodium: 135 mmol/L (ref 135–145)
TOTAL PROTEIN: 7.8 g/dL (ref 6.5–8.1)

## 2015-01-28 LAB — CBC WITH DIFFERENTIAL/PLATELET
BASOS PCT: 1 %
Basophils Absolute: 0.1 10*3/uL (ref 0–0.1)
Eosinophils Absolute: 0.2 10*3/uL (ref 0–0.7)
Eosinophils Relative: 1 %
HCT: 44.9 % (ref 35.0–47.0)
HEMOGLOBIN: 15.1 g/dL (ref 12.0–16.0)
Lymphocytes Relative: 13 %
Lymphs Abs: 2 10*3/uL (ref 1.0–3.6)
MCH: 30 pg (ref 26.0–34.0)
MCHC: 33.5 g/dL (ref 32.0–36.0)
MCV: 89.5 fL (ref 80.0–100.0)
MONOS PCT: 6 %
Monocytes Absolute: 0.8 10*3/uL (ref 0.2–0.9)
NEUTROS ABS: 11.7 10*3/uL — AB (ref 1.4–6.5)
Neutrophils Relative %: 79 %
Platelets: 287 10*3/uL (ref 150–440)
RBC: 5.02 MIL/uL (ref 3.80–5.20)
RDW: 13.2 % (ref 11.5–14.5)
WBC: 14.8 10*3/uL — ABNORMAL HIGH (ref 3.6–11.0)

## 2015-01-28 LAB — LIPASE, BLOOD: Lipase: 28 U/L (ref 22–51)

## 2015-01-28 LAB — PREGNANCY, URINE: PREG TEST UR: NEGATIVE

## 2015-01-28 MED ORDER — ONDANSETRON 4 MG PO TBDP
ORAL_TABLET | ORAL | Status: DC
Start: 2015-01-28 — End: 2015-01-29
  Filled 2015-01-28: qty 1

## 2015-01-28 MED ORDER — OXYCODONE-ACETAMINOPHEN 5-325 MG PO TABS
1.0000 | ORAL_TABLET | Freq: Once | ORAL | Status: AC
  Administered 2015-01-28: 1 via ORAL

## 2015-01-28 MED ORDER — ONDANSETRON 4 MG PO TBDP
4.0000 mg | ORAL_TABLET | Freq: Once | ORAL | Status: AC
  Administered 2015-01-28: 4 mg via ORAL

## 2015-01-28 MED ORDER — OXYCODONE-ACETAMINOPHEN 5-325 MG PO TABS
ORAL_TABLET | ORAL | Status: AC
  Filled 2015-01-28: qty 1

## 2015-01-28 NOTE — ED Notes (Signed)
Right sided abd pain since yest no hx of the same

## 2015-01-29 ENCOUNTER — Emergency Department: Payer: Commercial Indemnity | Admitting: Anesthesiology

## 2015-01-29 ENCOUNTER — Emergency Department: Payer: Commercial Indemnity

## 2015-01-29 ENCOUNTER — Encounter
Admission: EM | Disposition: A | Discharge status: Home / Self Care | Payer: Self-pay | Source: Home / Self Care | Attending: Emergency Medicine

## 2015-01-29 ENCOUNTER — Encounter: Payer: Self-pay | Admitting: Emergency Medicine

## 2015-01-29 HISTORY — PX: CYSTOSCOPY WITH STENT PLACEMENT: SHX5790

## 2015-01-29 SURGERY — CYSTOSCOPY, WITH STENT INSERTION
Anesthesia: General | Laterality: Right

## 2015-01-29 MED ORDER — SODIUM CHLORIDE 0.9 % IR SOLN
Status: DC | PRN
  Administered 2015-01-29: 500 mL

## 2015-01-29 MED ORDER — PROPOFOL 10 MG/ML IV BOLUS
INTRAVENOUS | Status: DC | PRN
  Administered 2015-01-29: 200 mg via INTRAVENOUS

## 2015-01-29 MED ORDER — LIDOCAINE HCL (CARDIAC) 20 MG/ML IV SOLN
INTRAVENOUS | Status: DC | PRN
  Administered 2015-01-29: 100 mg via INTRAVENOUS

## 2015-01-29 MED ORDER — SODIUM CHLORIDE 0.9 % IV SOLN
INTRAVENOUS | Status: DC | PRN
  Administered 2015-01-29: 05:00:00 via INTRAVENOUS

## 2015-01-29 MED ORDER — MIDAZOLAM HCL 2 MG/2ML IJ SOLN
INTRAMUSCULAR | Status: DC | PRN
  Administered 2015-01-29: 1 mg via INTRAVENOUS

## 2015-01-29 MED ORDER — FENTANYL CITRATE (PF) 100 MCG/2ML IJ SOLN
25.0000 ug | INTRAMUSCULAR | Status: AC | PRN
  Administered 2015-01-29 (×6): 25 ug via INTRAVENOUS

## 2015-01-29 MED ORDER — ONDANSETRON HCL 4 MG/2ML IJ SOLN
4.0000 mg | Freq: Once | INTRAMUSCULAR | Status: AC
  Administered 2015-01-29: 4 mg via INTRAVENOUS
  Filled 2015-01-29: qty 2

## 2015-01-29 MED ORDER — SODIUM CHLORIDE 0.9 % IJ SOLN
INTRAMUSCULAR | Status: DC | PRN
  Administered 2015-01-29: 5 mL

## 2015-01-29 MED ORDER — NEOSTIGMINE METHYLSULFATE 10 MG/10ML IV SOLN
INTRAVENOUS | Status: DC | PRN
  Administered 2015-01-29: 4 mg via INTRAVENOUS

## 2015-01-29 MED ORDER — HYDROMORPHONE HCL 1 MG/ML IJ SOLN
INTRAMUSCULAR | Status: AC
  Filled 2015-01-29: qty 1

## 2015-01-29 MED ORDER — ROCURONIUM BROMIDE 100 MG/10ML IV SOLN
INTRAVENOUS | Status: DC | PRN
  Administered 2015-01-29: 30 mg via INTRAVENOUS

## 2015-01-29 MED ORDER — TROSPIUM CHLORIDE ER 60 MG PO CP24
60.0000 mg | ORAL_CAPSULE | Freq: Every day | ORAL | Status: DC
Start: 2015-01-29 — End: 2015-02-10

## 2015-01-29 MED ORDER — SODIUM CHLORIDE 0.9 % IV BOLUS (SEPSIS)
1000.0000 mL | Freq: Once | INTRAVENOUS | Status: AC
  Administered 2015-01-29: 1000 mL via INTRAVENOUS
  Filled 2015-01-29: qty 1000

## 2015-01-29 MED ORDER — FENTANYL CITRATE (PF) 100 MCG/2ML IJ SOLN
INTRAMUSCULAR | Status: AC
  Filled 2015-01-29: qty 2

## 2015-01-29 MED ORDER — ACETAMINOPHEN-CODEINE #3 300-30 MG PO TABS
ORAL_TABLET | ORAL | Status: AC
  Administered 2015-01-29: 1
  Filled 2015-01-29: qty 1

## 2015-01-29 MED ORDER — PHENAZOPYRIDINE HCL 200 MG PO TABS: 200.0000 mg | ORAL_TABLET | Freq: Three times a day (TID) | ORAL | Status: DC | PRN

## 2015-01-29 MED ORDER — ONDANSETRON HCL 4 MG/2ML IJ SOLN: 4.0000 mg | Freq: Once | INTRAMUSCULAR | Status: DC | PRN

## 2015-01-29 MED ORDER — GLYCOPYRROLATE 0.2 MG/ML IJ SOLN
INTRAMUSCULAR | Status: DC | PRN
  Administered 2015-01-29: 0.3 mg via INTRAVENOUS
  Administered 2015-01-29: .6 mg via INTRAVENOUS

## 2015-01-29 MED ORDER — HYDROMORPHONE HCL 1 MG/ML IJ SOLN
1.0000 mg | Freq: Once | INTRAMUSCULAR | Status: AC
  Administered 2015-01-29: 1 mg via INTRAVENOUS

## 2015-01-29 MED ORDER — ONDANSETRON HCL 4 MG/2ML IJ SOLN
INTRAMUSCULAR | Status: DC | PRN
  Administered 2015-01-29: 4 mg via INTRAVENOUS

## 2015-01-29 MED ORDER — MORPHINE SULFATE 4 MG/ML IJ SOLN
INTRAMUSCULAR | Status: AC
  Administered 2015-01-29: 4 mg
  Filled 2015-01-29: qty 1

## 2015-01-29 MED ORDER — CIPROFLOXACIN IN D5W 400 MG/200ML IV SOLN
400.0000 mg | Freq: Once | INTRAVENOUS | Status: AC
  Administered 2015-01-29: 400 mg via INTRAVENOUS
  Filled 2015-01-29: qty 200

## 2015-01-29 MED ORDER — ONDANSETRON HCL 4 MG/2ML IJ SOLN
INTRAMUSCULAR | Status: AC
  Administered 2015-01-29: 4 mg
  Filled 2015-01-29: qty 2

## 2015-01-29 MED ORDER — IOHEXOL 300 MG/ML  SOLN
125.0000 mL | Freq: Once | INTRAMUSCULAR | Status: AC | PRN
  Administered 2015-01-29: 125 mL via INTRAVENOUS

## 2015-01-29 MED ORDER — HYDROMORPHONE HCL 1 MG/ML IJ SOLN
1.0000 mg | Freq: Once | INTRAMUSCULAR | Status: AC
  Administered 2015-01-29: 1 mg via INTRAVENOUS
  Filled 2015-01-29: qty 1

## 2015-01-29 MED ORDER — IOHEXOL 240 MG/ML SOLN
25.0000 mL | Freq: Once | INTRAMUSCULAR | Status: AC | PRN
  Administered 2015-01-29: 25 mL via ORAL

## 2015-01-29 MED ORDER — ACETAMINOPHEN-CODEINE #3 300-30 MG PO TABS: 1.0000 | ORAL_TABLET | ORAL | Status: DC | PRN

## 2015-01-29 MED ORDER — FENTANYL CITRATE (PF) 100 MCG/2ML IJ SOLN
INTRAMUSCULAR | Status: DC
Start: 2015-01-29 — End: 2015-01-29
  Filled 2015-01-29: qty 2

## 2015-01-29 SURGICAL SUPPLY — 18 items
BAG DRAIN CYSTO-URO LG1000N (MISCELLANEOUS) ×3 IMPLANT
CATH URETL 5X70 OPEN END (CATHETERS) ×3 IMPLANT
GLOVE BIO SURGEON STRL SZ 6.5 (GLOVE) ×2 IMPLANT
GLOVE BIO SURGEON STRL SZ7 (GLOVE) ×3 IMPLANT
GLOVE BIO SURGEONS STRL SZ 6.5 (GLOVE) ×1
GOWN STRL REUS W/ TWL LRG LVL3 (GOWN DISPOSABLE) ×2 IMPLANT
GOWN STRL REUS W/TWL LRG LVL3 (GOWN DISPOSABLE) ×4
JELLY LUB 2OZ STRL (MISCELLANEOUS) ×2
JELLY LUBE 2OZ STRL (MISCELLANEOUS) ×1 IMPLANT
PACK CYSTO AR (MISCELLANEOUS) ×3 IMPLANT
PREP PVP WINGED SPONGE (MISCELLANEOUS) ×3 IMPLANT
SENSORWIRE 0.038 NOT ANGLED (WIRE) ×3
SET CYSTO W/LG BORE CLAMP LF (SET/KITS/TRAYS/PACK) ×3 IMPLANT
SOL .9 NS 3000ML IRR  AL (IV SOLUTION) ×2
SOL .9 NS 3000ML IRR UROMATIC (IV SOLUTION) ×1 IMPLANT
STENT URET 6FRX24 CONTOUR (STENTS) ×3 IMPLANT
WATER STERILE IRR 1000ML POUR (IV SOLUTION) ×3 IMPLANT
WIRE SENSOR 0.038 NOT ANGLED (WIRE) ×1 IMPLANT

## 2015-01-29 NOTE — Anesthesia Postprocedure Evaluation (Signed)
  Anesthesia Post-op Note  Patient: Tanya Henderson  Procedure(s) Performed: Procedure(s): CYSTOSCOPY WITH STENT PLACEMENT (Right)  Anesthesia type:General  Patient location: PACU  Post pain: Pain level controlled  Post assessment: Post-op Vital signs reviewed, Patient's Cardiovascular Status Stable, Respiratory Function Stable, Patent Airway and No signs of Nausea or vomiting  Post vital signs: Reviewed and stable  Last Vitals:  Filed Vitals:   01/29/15 0655  BP: 143/83  Pulse: 50  Temp: 36.3 C  Resp:     Level of consciousness: awake, alert  and patient cooperative  Complications: No apparent anesthesia complications

## 2015-01-29 NOTE — ED Notes (Signed)
Pt drinking contrast for CT. 

## 2015-01-29 NOTE — Anesthesia Procedure Notes (Signed)
Procedure Name: Intubation Date/Time: 01/29/2015 4:48 AM Performed by: Shirlee Limerick, Shannon Kirkendall Pre-anesthesia Checklist: Patient identified, Emergency Drugs available, Suction available and Patient being monitored Patient Re-evaluated:Patient Re-evaluated prior to inductionOxygen Delivery Method: Circle system utilized Preoxygenation: Pre-oxygenation with 100% oxygen Intubation Type: IV induction Laryngoscope Size: Mac and 3 Grade View: Grade I Tube type: Oral Tube size: 7.0 mm Placement Confirmation: ETT inserted through vocal cords under direct vision,  positive ETCO2 and breath sounds checked- equal and bilateral Secured at: 21 cm Tube secured with: Tape Dental Injury: Teeth and Oropharynx as per pre-operative assessment

## 2015-01-29 NOTE — ED Notes (Signed)
Pt taken to OR per md order, consent form signed and site marker per md

## 2015-01-29 NOTE — Transfer of Care (Signed)
Immediate Anesthesia Transfer of Care Note  Patient: Tanya Henderson  Procedure(s) Performed: Procedure(s): CYSTOSCOPY WITH STENT PLACEMENT (Right)  Patient Location: PACU  Anesthesia Type:General  Level of Consciousness: awake, alert , oriented and patient cooperative  Airway & Oxygen Therapy: Patient Spontanous Breathing and Patient connected to nasal cannula oxygen  Post-op Assessment: Report given to RN and Post -op Vital signs reviewed and stable  Post vital signs: Reviewed and stable  Last Vitals:  Filed Vitals:   01/29/15 0514  BP: 161/97  Pulse:   Temp: 36.2 C  Resp: 15    Complications: No apparent anesthesia complications

## 2015-01-29 NOTE — Discharge Instructions (Signed)
DISCHARGE INSTRUCTIONS FOR KIDNEY STONE/URETERAL STENT   MEDICATIONS:  1.  Resume all your other meds from home - except do not take any extra narcotic pain meds that you may have at home.  2. Trospium is to prevent bladder spasms and help reduce urinary frequency. 3. Pyridium is to help with the burning/stinging when you urinate. 4. Tylenol with codeine is for moderate/severe pain, otherwise taking upto  every 6 hours of plainTylenol will help treat your pain.  Do not take both at the same time.   ACTIVITY:  1. No strenuous activity x 1week  2. No driving while on narcotic pain medications  3. Drink plenty of water  4. Continue to walk at home - you can still get blood clots when you are at home, so keep active, but don't over do it.  5. May return to work/school tomorrow or when you feel ready   BATHING:  1. You can shower and we recommend daily showers    SIGNS/SYMPTOMS TO CALL:  Please call us if you have a fever greater than 101.5, uncontrolled nausea/vomiting, uncontrolled pain, dizziness, unable to urinate, bloody urine, chest pain, shortness of breath, leg swelling, leg pain, redness around wound, drainage from wound, or any other concerns or questions.   You can reach [place] at [phone].   FOLLOW-UP:  1. The patient will be contacted by [facility] for a follow-up appointment next week. AMBULATORY SURGERY  DISCHARGE INSTRUCTIONS   1) The drugs that you were given will stay in your system until tomorrow so for the next 24 hours you should not:  A) Drive an automobile B) Make any legal decisions C) Drink any alcoholic beverage   2) You may resume regular meals tomorrow.  Today it is better to start with liquids and gradually work up to solid foods.  You may eat anything you prefer, but it is better to start with liquids, then soup and crackers, and gradually work up to solid foods.   3) Please notify your doctor immediately if you have any  unusual bleeding, trouble breathing, redness and pain at the surgery site, drainage, fever, or pain not relieved by medication.    4) Additional Instructions:        Please contact your physician with any problems or Same Day Surgery at [phone], Monday through Friday 6 am to 4 pm, or [employer] at [facility] number at [phone].

## 2015-01-29 NOTE — H&P (Signed)
I have been asked to see the patient by Dr. Chiquita Loth, MD, for evaluation and management of right ureteral stone.  History of present illness: 39 year old female who presented  to the emergency department with acute onset right flank pain yesterday evening. The patient's symptoms began approximately 5 pm yesterday.  She noted intense right flank pain/pressure.  Her pain is 10/10, radiates around to her right suprapubic region, and is assoicated with nausea/vomitting.  She denies any dysuria or gross hematuria.  She has not had any fevers or chills.  She has no significant GU history - never had kidney stones prior.  In the emergency department the patient underwent a CT scan which demonstrated a right proximal ureteral stone with associated hydronephrosis.  Her WBC was slightly elevated,  Her renal function was normal, her UA did not demonstrate evidence of infection.  Her pain was difficult to control with IV pain medications.  Review of systems: A 12 point comprehensive review of systems was obtained and is negative unless otherwise stated in the history of present illness.  Patient Active Problem List   Diagnosis Date Noted  . Unspecified vitamin D deficiency 01/13/2013  . Hidradenitis 01/13/2013    No current facility-administered medications on file prior to encounter.   Current Outpatient Prescriptions on File Prior to Encounter  Medication Sig Dispense Refill  . triamcinolone cream (KENALOG) 0.1 % Apply 1 application topically 2 (two) times daily. 30 g 0    Past Medical History  Diagnosis Date  . Vitamin D deficiency     Past Surgical History  Procedure Laterality Date  . Cholecystectomy    . Oophorectomy Right 1996    Ovarian cyst    Social History  Substance Use Topics  . Smoking status: Current Every Day Smoker  . Smokeless tobacco: Never Used  . Alcohol Use: Yes    No family history on file.  PE: Filed Vitals:   01/28/15 2105 01/29/15 0005 01/29/15 0131  01/29/15 0307  BP: 143/86 139/90 133/85 129/83  Pulse: 59 64 66 69  Temp: 97.5 F (36.4 C)     TempSrc: Oral     Resp: 18 18 18 16   Height: 5\' 3"  (1.6 m)     Weight: 108.863 kg (240 lb)     SpO2: 98% 98%  95%   Patient appears to be in no acute distress  patient is alert and oriented x3 Atraumatic normocephalic head No cervical or supraclavicular lymphadenopathy appreciated No increased work of breathing, no audible wheezes/rhonchi Regular sinus rhythm/rate Abdomen is soft, nontender, nondistended Right CVA and lower quadrant pain Lower extremities are symmetric without appreciable edema Grossly neurologically intact No identifiable skin lesions   Recent Labs  01/28/15 2114  WBC 14.8*  HGB 15.1  HCT 44.9    Recent Labs  01/28/15 2114  NA 135  K 3.8  CL 103  CO2 23  GLUCOSE 133*  BUN 12  CREATININE 0.86  CALCIUM 8.9   No results for input(s): LABPT, INR in the last 72 hours. No results for input(s): LABURIN in the last 72 hours. Results for orders placed or performed during the hospital encounter of 07/24/09  GC/chlamydia probe amp, genital     Status: None   Collection Time: 07/24/09  9:50 AM  Result Value Ref Range Status   GC Probe Amp, Genital  NEGATIVE Final    NEGATIVE (NOTE)  Testing performed using the BD Probetec ET Chlamydia trachomatis and Neisseria gonorrhea amplified DNA assay.   Chlamydia, DNA  Probe  NEGATIVE Final    NEGATIVE (NOTE)  Testing performed using the BD Probetec ET Chlamydia trachomatis and Neisseria gonorrhea amplified DNA assay.  Wet prep, genital     Status: Abnormal   Collection Time: 07/24/09  9:50 AM  Result Value Ref Range Status   Yeast Wet Prep HPF POC NONE SEEN NONE SEEN Final   Trich, Wet Prep NONE SEEN NONE SEEN Final   Clue Cells Wet Prep HPF POC FEW (A) NONE SEEN Final   WBC, Wet Prep HPF POC FEW (A) NONE SEEN Final    Imaging: I've individually reviewed the patient's CT scan which demonstrates a 7 mm right  proximal ureteral stone with associated hydronephrosis.  Imp: The patient has a right proximal ureteral stone with poorly controlled pain.  Recommendations: I discussed the management options with this patient which include continued medical expulsion therapy or placement of a double-J right ureteral stent urgently. Given the patient has poorly controlled pain, I recommended that we proceed to the operating room and place a stent. We discussed the procedure in great detail. I went over the risks and benefits. The patient understands that following the operation she will need an additional procedure for her stone. She also understands that her pain is likely to improve significantly, although she will have some stent discomfort. The patient will be discharged home today following the operation and will follow-up with [facility] next week.   Berniece Salines W

## 2015-01-29 NOTE — Op Note (Signed)
Preoperative diagnosis:  1. Right ureteral stone   Postoperative diagnosis:  1. same   Procedure:  1. Cystoscopy 2. right ureteral stent placement 3. right retrograde pyelography with interpretation   Surgeon: Crist Fat, MD  Anesthesia: General  Complications: None  Intraoperative findings: Retrograde pyelogram demonstrated a normal caliber right ureter up to the point of a filling defect in the proximal ureter consistent with the patient's known stone. She had associated proximal hydronephrosis.  On cystoscopy, the patient had a hypervascular bladder, predominantly venules resulting in a strikingly blue tint to her bladder.  EBL: Minimal  Specimens: None  Indication: Tanya Henderson is a 39 y.o. patient with poorly controlled pain secondary to a 7 mm proximal right ureteral stone. After reviewing the management options for treatment, he elected to proceed with the above surgical procedure(s). We have discussed the potential benefits and risks of the procedure, side effects of the proposed treatment, the likelihood of the patient achieving the goals of the procedure, and any potential problems that might occur during the procedure or recuperation. Informed consent has been obtained.  Description of procedure:  The patient was taken to the operating room and general anesthesia was induced.  The patient was placed in the dorsal lithotomy position, prepped and draped in the usual sterile fashion, and preoperative antibiotics were administered. A preoperative time-out was performed.   Cystourethroscopy was performed.  The patient's urethra was examined and was normal. The bladder was then systematically examined in its entirety. There was no evidence for any bladder tumors, stones, or other mucosal pathology.    Attention then turned to the rightureteral orifice and a ureteral catheter was used to intubate the ureteral orifice.  Omnipaque contrast was injected through  the ureteral catheter and a retrograde pyelogram was performed with findings as dictated above.  A 0.38 sensor guidewire was then advanced up the right ureter into the renal pelvis under fluoroscopic guidance.  The wire was then backloaded through the cystoscope and a ureteral stent was advance over the wire using Seldinger technique.  The stent was positioned appropriately under fluoroscopic and cystoscopic guidance.  The wire was then removed with an adequate stent curl noted in the renal pelvis as well as in the bladder.  The bladder was then emptied and the procedure ended.  The patient appeared to tolerate the procedure well and without complications.  The patient was able to be awakened and transferred to the recovery unit in satisfactory condition.    Crist Fat, M.D.

## 2015-01-29 NOTE — ED Notes (Signed)
Right sided abd pain since Wednesday worse today, no diarrhea, has nausea no vomiting, no fever. Pt has no hx of the same, no dysuria.

## 2015-01-29 NOTE — Anesthesia Preprocedure Evaluation (Addendum)
Anesthesia Evaluation  Patient identified by MRN, date of birth, ID band Patient awake    Reviewed: Allergy & Precautions, NPO status , Patient's Chart, lab work & pertinent test results, reviewed documented beta blocker date and time   History of Anesthesia Complications (+) MALIGNANT HYPERTHERMIA  Airway Mallampati: II  TM Distance: >3 FB     Dental  (+) Chipped   Pulmonary Current Smoker,          Cardiovascular     Neuro/Psych Anxiety    GI/Hepatic   Endo/Other    Renal/GU      Musculoskeletal   Abdominal   Peds  Hematology   Anesthesia Other Findings Obesity.  Reproductive/Obstetrics                            Anesthesia Physical Anesthesia Plan  ASA: II  Anesthesia Plan: General   Post-op Pain Management:    Induction: Intravenous  Airway Management Planned: Oral ETT  Additional Equipment:   Intra-op Plan:   Post-operative Plan:   Informed Consent: I have reviewed the patients History and Physical, chart, labs and discussed the procedure including the risks, benefits and alternatives for the proposed anesthesia with the patient or authorized representative who has indicated his/her understanding and acceptance.     Plan Discussed with: CRNA  Anesthesia Plan Comments:         Anesthesia Quick Evaluation

## 2015-01-29 NOTE — ED Notes (Signed)
Urologist in to evaluate

## 2015-01-29 NOTE — ED Provider Notes (Signed)
[facility] Emergency Department Provider Note  ____________________________________________  Time seen: Approximately 12:27 AM  I have reviewed the triage vital signs and the nursing notes.   HISTORY  Chief Complaint Abdominal Pain    HPI Tanya Henderson is a 39 y.o. female who presents to the ED from home with a 2 day history of abdominal pain. Patient reports right sided abdominal pain which was intermittent yesterday; pain has become progressive and constant today. Patient describes 10/10 sharp pain starting in her epigastrium which radiates diffusely, especially to her right lower quadrant and wrapping around to her right flank. Symptoms are associated with nausea only. Last bowel movement was yesterday which was normal. Denies fever, chills, chest pain, shortness of breath, dysuria, diarrhea. Denies recent travel or trauma. Denies being on her period. Nothing makes the pain better or worse.   Past Medical History  Diagnosis Date  . Vitamin D deficiency     Patient Active Problem List   Diagnosis Date Noted  . Unspecified vitamin D deficiency 01/13/2013  . Hidradenitis 01/13/2013    Past Surgical History  Procedure Laterality Date  . Cholecystectomy    . Oophorectomy Right 1996    Ovarian cyst    Current Outpatient Rx  Name  Route  Sig  Dispense  Refill  . Acetaminophen 500 MG coapsule   Oral   Take 1 capsule by mouth every 6 (six) hours as needed.         Marland Kitchen ibuprofen (ADVIL,MOTRIN) 200 MG tablet   Oral   Take 1 tablet by mouth every 6 (six) hours as needed.         . triamcinolone cream (KENALOG) 0.1 %   Topical   Apply 1 application topically 2 (two) times daily.   30 g   0     Allergies Penicillins  Family history Kidney stones  Social History Social History  Substance Use Topics  . Smoking status: Current Every Day Smoker  . Smokeless tobacco: Never Used  . Alcohol Use: Yes    Review of  Systems Constitutional: No fever/chills Eyes: No visual changes. ENT: No sore throat. Cardiovascular: Denies chest pain. Respiratory: Denies shortness of breath. Gastrointestinal: Positive for  abdominal pain.   Positive for nausea, no vomiting.  No diarrhea.  No constipation. Genitourinary: Negative for dysuria. Musculoskeletal: Negative for back pain. Skin: Negative for rash. Neurological: Negative for headaches, focal weakness or numbness.  10-point ROS otherwise negative.  ____________________________________________   PHYSICAL EXAM:  VITAL SIGNS: ED Triage Vitals  Enc Vitals Group     BP 01/28/15 2105 143/86 mmHg     Pulse Rate 01/28/15 2105 59     Resp 01/28/15 2105 18     Temp 01/28/15 2105 97.5 F (36.4 C)     Temp Source 01/28/15 2105 Oral     SpO2 01/28/15 2105 98 %     Weight 01/28/15 2105 240 lb (108.863 kg)     Height 01/28/15 2105  (1.6 m)     Head Cir --      Peak Flow --      Pain Score 01/28/15 2106 10     Pain Loc --      Pain Edu? --      Excl. in GC? --     Constitutional: Alert and oriented. Well appearing and in moderate acute distress. Eyes: Conjunctivae are normal. PERRL. EOMI. Head: Atraumatic. Nose: No congestion/rhinnorhea. Mouth/Throat: Mucous membranes are moist.  Oropharynx non-erythematous. Neck: No stridor.  Cardiovascular: Normal rate, regular rhythm. Grossly normal heart sounds.  Good peripheral circulation. Respiratory: Normal respiratory effort.  No retractions. Lungs CTAB. Gastrointestinal: Soft, tender to palpation diffusely without rebound or guarding. No distention. No abdominal bruits. No CVA tenderness. Musculoskeletal: No lower extremity tenderness nor edema.  No joint effusions. Neurologic:  Normal speech and language. No gross focal neurologic deficits are appreciated. No gait instability. Skin:  Skin is warm, dry and intact. No rash noted. Psychiatric: Mood and affect are normal. Speech and behavior are  normal.  ____________________________________________   LABS (all labs ordered are listed, but only abnormal results are displayed)  Labs Reviewed  CBC WITH DIFFERENTIAL/PLATELET - Abnormal; Notable for the following:    WBC 14.8 (*)    Neutro Abs 11.7 (*)    All other components within normal limits  COMPREHENSIVE METABOLIC PANEL - Abnormal; Notable for the following:    Glucose, Bld 133 (*)    All other components within normal limits  URINALYSIS COMPLETEWITH MICROSCOPIC ([facility] ONLY) - Abnormal; Notable for the following:    Color, Urine YELLOW (*)    APPearance HAZY (*)    Ketones, ur 1+ (*)    Hgb urine dipstick 3+ (*)    Protein, ur 30 (*)    Bacteria, UA RARE (*)    Squamous Epithelial / LPF 6-30 (*)    All other components within normal limits  LIPASE, BLOOD  PREGNANCY, URINE   ____________________________________________  EKG  None ____________________________________________  RADIOLOGY  CT abdomen pelvis interpreted per Dr. Cherly Hensen: 1. Mild right-sided hydronephrosis, with right-sided perinephric stranding and fluid. Obstructing 7 x 7 mm stone noted in the proximal right ureter, just below the right renal pelvis. 2. 3 mm nonobstructing stone at the lower pole of the left kidney. 3. Apparent complete septate uterus noted. 4. Minimal diverticulosis along the proximal sigmoid colon, without evidence of diverticulitis.  ____________________________________________   PROCEDURES  Procedure(s) performed: None  Critical Care performed:   CRITICAL CARE Performed by: Irean Hong   Total critical care time: 30 minutes  Critical care time was exclusive of separately billable procedures and treating other patients.  Critical care was necessary to treat or prevent imminent or life-threatening deterioration.  Critical care was time spent personally by me on the following activities: development of treatment plan with patient and/or surrogate as well as nursing,  discussions with consultants, evaluation of patient's response to treatment, examination of patient, obtaining history from patient or surrogate, ordering and performing treatments and interventions, ordering and review of laboratory studies, ordering and review of radiographic studies, pulse oximetry and re-evaluation of patient's condition.  ____________________________________________   INITIAL IMPRESSION / ASSESSMENT AND PLAN / ED COURSE  Pertinent labs & imaging results that were available during my care of the patient were reviewed by me and considered in my medical decision making (see chart for details).  39 year old female who presents with abdominal pain and nausea. Labs notable for leukocytosis and hematuria. Patient has no personal history for kidney stones. Given patient's diffuse pain and atypical story for renal colic, we'll proceed with CT abdomen/pelvis to evaluate infectious/inflammatory etiology. IV analgesia and antiemetic ordered for symptomatic relief of pain and nausea.  ----------------------------------------- 2:59 AM on 01/29/2015 -----------------------------------------  Pain returning. Will administer her third dose of IV analgesia. Updated patient and mother of CT findings for obstructive stone.Will discuss case with urology.  ----------------------------------------- 3:04 AM on 01/29/2015 -----------------------------------------  Discussed case with Dr. Marlou Porch from urology who will admit patient for stent placement.  ____________________________________________   FINAL CLINICAL IMPRESSION(S) / ED DIAGNOSES  Final diagnoses:  Generalized abdominal pain  Kidney stone on right side  Hydronephrosis with urinary obstruction due to ureteral calculus  Intractable pain    Irean Hong, MD 01/29/15 [id number]

## 2015-01-29 NOTE — ED Notes (Signed)
Pt awaiting for Dr Marlou Porch to go to OR, pt up to bathroom and in gown.

## 2015-02-01 ENCOUNTER — Emergency Department: Payer: Commercial Indemnity

## 2015-02-01 ENCOUNTER — Emergency Department
Admission: EM | Admit: 2015-02-01 | Discharge: 2015-02-01 | Disposition: A | Discharge status: Home / Self Care | Payer: Commercial Indemnity | Attending: Emergency Medicine | Admitting: Emergency Medicine

## 2015-02-01 ENCOUNTER — Encounter: Payer: Self-pay | Admitting: *Deleted

## 2015-02-01 DIAGNOSIS — Z880 Allergy status to penicillin: Secondary | ICD-10-CM | POA: Diagnosis not present

## 2015-02-01 DIAGNOSIS — Z720 Tobacco use: Secondary | ICD-10-CM | POA: Diagnosis not present

## 2015-02-01 DIAGNOSIS — Z79899 Other long term (current) drug therapy: Secondary | ICD-10-CM | POA: Diagnosis not present

## 2015-02-01 DIAGNOSIS — Z7952 Long term (current) use of systemic steroids: Secondary | ICD-10-CM | POA: Insufficient documentation

## 2015-02-01 DIAGNOSIS — N23 Unspecified renal colic: Secondary | ICD-10-CM | POA: Diagnosis not present

## 2015-02-01 DIAGNOSIS — N200 Calculus of kidney: Secondary | ICD-10-CM | POA: Diagnosis present

## 2015-02-01 DIAGNOSIS — Z792 Long term (current) use of antibiotics: Secondary | ICD-10-CM | POA: Diagnosis not present

## 2015-02-01 LAB — URINALYSIS COMPLETE WITH MICROSCOPIC (ARMC ONLY): Specific Gravity, Urine: 1.025 (ref 1.005–1.030)

## 2015-02-01 LAB — BASIC METABOLIC PANEL
Anion gap: 10 (ref 5–15)
BUN: 12 mg/dL (ref 6–20)
CO2: 22 mmol/L (ref 22–32)
Calcium: 9.2 mg/dL (ref 8.9–10.3)
Chloride: 106 mmol/L (ref 101–111)
Creatinine, Ser: 0.84 mg/dL (ref 0.44–1.00)
Glucose, Bld: 93 mg/dL (ref 65–99)
POTASSIUM: 3.5 mmol/L (ref 3.5–5.1)
SODIUM: 138 mmol/L (ref 135–145)

## 2015-02-01 LAB — CBC WITH DIFFERENTIAL/PLATELET
BASOS ABS: 0.1 10*3/uL (ref 0–0.1)
Basophils Relative: 1 %
Eosinophils Absolute: 0.4 10*3/uL (ref 0–0.7)
Eosinophils Relative: 3 %
HCT: 48 % — ABNORMAL HIGH (ref 35.0–47.0)
Hemoglobin: 15.7 g/dL (ref 12.0–16.0)
LYMPHS ABS: 2.1 10*3/uL (ref 1.0–3.6)
Lymphocytes Relative: 17 %
MCH: 29.3 pg (ref 26.0–34.0)
MCHC: 32.7 g/dL (ref 32.0–36.0)
MCV: 89.4 fL (ref 80.0–100.0)
MONO ABS: 0.9 10*3/uL (ref 0.2–0.9)
Monocytes Relative: 7 %
Neutro Abs: 9.1 10*3/uL — ABNORMAL HIGH (ref 1.4–6.5)
Neutrophils Relative %: 72 %
Platelets: 319 10*3/uL (ref 150–440)
RBC: 5.37 MIL/uL — ABNORMAL HIGH (ref 3.80–5.20)
RDW: 13.3 % (ref 11.5–14.5)
WBC: 12.6 10*3/uL — ABNORMAL HIGH (ref 3.6–11.0)

## 2015-02-01 MED ORDER — ONDANSETRON HCL 4 MG/2ML IJ SOLN
4.0000 mg | Freq: Once | INTRAMUSCULAR | Status: AC
  Administered 2015-02-01: 4 mg via INTRAVENOUS
  Filled 2015-02-01: qty 2

## 2015-02-01 MED ORDER — CIPROFLOXACIN HCL 500 MG PO TABS: 500.0000 mg | ORAL_TABLET | Freq: Two times a day (BID) | ORAL | Status: DC

## 2015-02-01 MED ORDER — SODIUM CHLORIDE 0.9 % IV BOLUS (SEPSIS)
500.0000 mL | Freq: Once | INTRAVENOUS | Status: AC
  Administered 2015-02-01: 500 mL via INTRAVENOUS

## 2015-02-01 MED ORDER — HYDROMORPHONE HCL 2 MG PO TABS
2.0000 mg | ORAL_TABLET | Freq: Once | ORAL | Status: AC
Start: 2015-02-01 — End: 2015-02-01
  Administered 2015-02-01: 2 mg via ORAL
  Filled 2015-02-01: qty 1

## 2015-02-01 MED ORDER — HYDROMORPHONE HCL 2 MG PO TABS: 2.0000 mg | ORAL_TABLET | Freq: Two times a day (BID) | ORAL | Status: DC | PRN

## 2015-02-01 MED ORDER — KETOROLAC TROMETHAMINE 30 MG/ML IJ SOLN
30.0000 mg | Freq: Once | INTRAMUSCULAR | Status: AC
  Administered 2015-02-01: 30 mg via INTRAVENOUS
  Filled 2015-02-01: qty 1

## 2015-02-01 NOTE — ED Provider Notes (Signed)
Time Seen: Approximately 1620  I have reviewed the triage notes  Chief Complaint: Nephrolithiasis   History of Present Illness: Tanya Henderson is a 39 y.o. female who recently had a right renal stent placed due to a large calculus. Patient has a 7 mm proximal ureteral stone. The patient had her stent placed on August 12. She states that since her stents in place she's had some increasing right-sided pain. She denies any fever at home she has had some nausea. She was given Tylenol with Codeine for pain which did not seem to help her discomfort. She is requesting to have the stent removed. She denies any bowel issues. She denies any objective fever at home. She has been on prior radium which has made her urine orange in appearance.   Past Medical History  Diagnosis Date  . Vitamin D deficiency     Patient Active Problem List   Diagnosis Date Noted  . Unspecified vitamin D deficiency 01/13/2013  . Hidradenitis 01/13/2013    Past Surgical History  Procedure Laterality Date  . Cholecystectomy    . Oophorectomy Right 1996    Ovarian cyst  . Cystoscopy with stent placement Right 01/29/2015    Procedure: CYSTOSCOPY WITH STENT PLACEMENT;  Surgeon: Crist Fat, MD;  Location: [facility] ORS;  Service: Urology;  Laterality: Right;    Past Surgical History  Procedure Laterality Date  . Cholecystectomy    . Oophorectomy Right 1996    Ovarian cyst  . Cystoscopy with stent placement Right 01/29/2015    Procedure: CYSTOSCOPY WITH STENT PLACEMENT;  Surgeon: Crist Fat, MD;  Location: [facility] ORS;  Service: Urology;  Laterality: Right;    Current Outpatient Rx  Name  Route  Sig  Dispense  Refill  . Acetaminophen 500 MG coapsule   Oral   Take 1 capsule by mouth every 6 (six) hours as needed.         Marland Kitchen acetaminophen-codeine (TYLENOL #3) 300-30 MG per tablet   Oral   Take 1 tablet by mouth every 4 (four) hours as needed.   20 tablet   0   . ciprofloxacin (CIPRO) 500  MG tablet   Oral   Take 1 tablet (500 mg total) by mouth 2 (two) times daily.   6 tablet   0   . HYDROmorphone (DILAUDID) 2 MG tablet   Oral   Take 1 tablet (2 mg total) by mouth every 12 (twelve) hours as needed for severe pain.   20 tablet   0   . phenazopyridine (PYRIDIUM) 200 MG tablet   Oral   Take 1 tablet (200 mg total) by mouth 3 (three) times daily as needed for pain.   10 tablet   0   . triamcinolone cream (KENALOG) 0.1 %   Topical   Apply 1 application topically 2 (two) times daily.   30 g   0   . Trospium Chloride 60 MG CP24   Oral   Take 1 capsule (60 mg total) by mouth daily.   7 each   0     Allergies:  Penicillins  Family History: No family history on file.  Social History: Social History  Substance Use Topics  . Smoking status: Current Every Day Smoker -- 1.00 packs/day  . Smokeless tobacco: Never Used  . Alcohol Use: Yes     Review of Systems:   10 point review of systems was performed and was otherwise negative:  Constitutional: No fever Eyes: No visual disturbances ENT:  No sore throat, ear pain Cardiac: No chest pain Respiratory: No shortness of breath, wheezing, or stridor Abdomen: Pain mainly located in the right flank region No abdominal pain, no vomiting, No diarrhea Endocrine: No weight loss, No night sweats Extremities: No peripheral edema, cyanosis Skin: No rashes, easy bruising Neurologic: No focal weakness, trouble with speech or swollowing Urologic: Patient's had pressure to urinate along with urinary frequency.   Physical Exam:  ED Triage Vitals  Enc Vitals Group     BP 02/01/15 1500 164/100 mmHg     Pulse Rate 02/01/15 1500 89     Resp 02/01/15 1620 16     Temp 02/01/15 1500 98.5 F (36.9 C)     Temp Source 02/01/15 1500 Oral     SpO2 02/01/15 1500 100 %     Weight 02/01/15 1500 240 lb (108.863 kg)     Height 02/01/15 1500 5\' 4"  (1.626 m)     Head Cir --      Peak Flow --      Pain Score 02/01/15 1501 7      Pain Loc --      Pain Edu? --      Excl. in GC? --     General: Awake , Alert , and Oriented times 3; GCS 15 Head: Normal cephalic , atraumatic Eyes: Pupils equal , round, reactive to light Nose/Throat: No nasal drainage, patent upper airway without erythema or exudate.  Neck: Supple, Full range of motion, No anterior adenopathy or palpable thyroid masses Lungs: Clear to ascultation without wheezes , rhonchi, or rales Heart: Regular rate, regular rhythm without murmurs , gallops , or rubs Abdomen: Soft, non tender without rebound, guarding , or rigidity; bowel sounds positive and symmetric in all 4 quadrants. No organomegaly .        Extremities: 2 plus symmetric pulses. No edema, clubbing or cyanosis Neurologic: normal ambulation, Motor symmetric without deficits, sensory intact Skin: warm, dry, no rashes   Labs:   All laboratory work was reviewed including any pertinent negatives or positives listed below:  Labs Reviewed  CBC WITH DIFFERENTIAL/PLATELET - Abnormal; Notable for the following:    WBC 12.6 (*)    RBC 5.37 (*)    HCT 48.0 (*)    Neutro Abs 9.1 (*)    All other components within normal limits  URINALYSIS COMPLETEWITH MICROSCOPIC ([facility] ONLY) - Abnormal; Notable for the following:    Color, Urine ORANGE (*)    APPearance CLOUDY (*)    Glucose, UA RESULTS UNAVAILABLE DUE TO INTERFERING SUBSTANCE (*)    Bilirubin Urine RESULTS UNAVAILABLE DUE TO INTERFERING SUBSTANCE (*)    Ketones, ur RESULTS UNAVAILABLE DUE TO INTERFERING SUBSTANCE (*)    Hgb urine dipstick RESULTS UNAVAILABLE DUE TO INTERFERING SUBSTANCE (*)    Protein, ur RESULTS UNAVAILABLE DUE TO INTERFERING SUBSTANCE (*)    Nitrite RESULTS UNAVAILABLE DUE TO INTERFERING SUBSTANCE (*)    Leukocytes, UA RESULTS UNAVAILABLE DUE TO INTERFERING SUBSTANCE (*)    Bacteria, UA RARE (*)    Squamous Epithelial / LPF 0-5 (*)    All other components within normal limits  URINE CULTURE  BASIC METABOLIC PANEL  BASIC  METABOLIC PANEL       Radiology:  I personally reviewed the radiologic studies  : ABDOMEN - 1 VIEW  COMPARISON: CT 01/29/2015  FINDINGS: Right ureteral stent is in place. Probable migration of the stone previously seen in the proximal ureter by CT. A 7 mm calcification projects near the stent  just beyond the pelvic brim, likely in the distal ureter. Oral contrast material seen within the colon. Nonobstructive bowel gas pattern. No free air.  IMPRESSION: Probable migration of the right ureteral stone into the distal right ureter just beyond the pelvic brim. Right ureteral stent in place.    Critical Care: None    ED Course: Patient's stay was overall uneventful she was given Toradol for pain and oral Dilaudid with symptomatic improvement. She has an upcoming appointment with her urologist and advised her to keep that appointment discuss future plans with the stent. The stent itself does not appear to be have any kind of complication at this point. Patient was placed on ciprofloxacin due to the urine appearance though it is of understanding that this is stent the urine is pending show signs of infection and blood. The patient appears to be of understanding of this and was prescribed Cipro on an outpatient basis. She was advised to drink plenty of fluids and return here initially if she has a fever or changes from her current clinical presentation    Assessment: Renal colic  Final diagnoses:  Renal colic     Plan: Patient was advised to return immediately if condition worsens. Patient was advised to follow up with her primary care physician or other specialized physicians involved and in their current assessment.             Jennye Moccasin, MD 02/01/15 [phone]

## 2015-02-01 NOTE — Discharge Instructions (Signed)
Kidney Stones °Kidney stones (urolithiasis) are deposits that form inside your kidneys. The intense pain is caused by the stone moving through the urinary tract. When the stone moves, the ureter goes into spasm around the stone. The stone is usually passed in the urine.  °CAUSES  °· A disorder that makes certain neck glands produce too much parathyroid hormone (primary hyperparathyroidism). °· A buildup of uric acid crystals, similar to gout in your joints. °· Narrowing (stricture) of the ureter. °· A kidney obstruction present at birth (congenital obstruction). °· Previous surgery on the kidney or ureters. °· Numerous kidney infections. °SYMPTOMS  °· Feeling sick to your stomach (nauseous). °· Throwing up (vomiting). °· Blood in the urine (hematuria). °· Pain that usually spreads (radiates) to the groin. °· Frequency or urgency of urination. °DIAGNOSIS  °· Taking a history and physical exam. °· Blood or urine tests. °· CT scan. °· Occasionally, an examination of the inside of the urinary bladder (cystoscopy) is performed. °TREATMENT  °· Observation. °· Increasing your fluid intake. °· Extracorporeal shock wave lithotripsy--This is a noninvasive procedure that uses shock waves to break up kidney stones. °· Surgery may be needed if you have severe pain or persistent obstruction. There are various surgical procedures. Most of the procedures are performed with the use of small instruments. Only small incisions are needed to accommodate these instruments, so recovery time is minimized. °The size, location, and chemical composition are all important variables that will determine the proper choice of action for you. Talk to your health care provider to better understand your situation so that you will minimize the risk of injury to yourself and your kidney.  °HOME CARE INSTRUCTIONS  °· Drink enough water and fluids to keep your urine clear or pale yellow. This will help you to pass the stone or stone fragments. °· Strain  all urine through the provided strainer. Keep all particulate matter and stones for your health care provider to see. The stone causing the pain may be as small as a grain of salt. It is very important to use the strainer each and every time you pass your urine. The collection of your stone will allow your health care provider to analyze it and verify that a stone has actually passed. The stone analysis will often identify what you can do to reduce the incidence of recurrences. °· Only take over-the-counter or prescription medicines for pain, discomfort, or fever as directed by your health care provider. °· Make a follow-up appointment with your health care provider as directed. °· Get follow-up X-rays if required. The absence of pain does not always mean that the stone has passed. It may have only stopped moving. If the urine remains completely obstructed, it can cause loss of kidney function or even complete destruction of the kidney. It is your responsibility to make sure X-rays and follow-ups are completed. Ultrasounds of the kidney can show blockages and the status of the kidney. Ultrasounds are not associated with any radiation and can be performed easily in a matter of minutes. °SEEK MEDICAL CARE IF: °· You experience pain that is progressive and unresponsive to any pain medicine you have been prescribed. °SEEK IMMEDIATE MEDICAL CARE IF:  °· Pain cannot be controlled with the prescribed medicine. °· You have a fever or shaking chills. °· The severity or intensity of pain increases over 18 hours and is not relieved by pain medicine. °· You develop a new onset of abdominal pain. °· You feel faint or pass out. °·   You are unable to urinate. MAKE SURE YOU:   Understand these instructions.  Will watch your condition.  Will get help right away if you are not doing well or get worse. Document Released: 06/05/2005 Document Revised: 02/05/2013 Document Reviewed: 11/06/2012 [facility] Patient Information 2015  [place], [place]. This information is not intended to replace advice given to you by your health care provider. Make sure you discuss any questions you have with your health care provider.  Please return immediately if condition worsens. Please contact her primary physician or the physician you were given for referral. If you have any specialist physicians involved in her treatment and plan please also contact them. Thank you for using  regional emergency Department. Continue drinking plenty of fluids and please contact the urologist prior to visit on Wednesday with concerns about her right-sided stent. He can take some low-dose ibuprofen over-the-counter for pain. Over-the-counter medications for constipation

## 2015-02-01 NOTE — ED Notes (Signed)
Pt was seen here on Thursday for kidney stone and stent placement, pt unable to get an appointment with urology, pt here for pain control

## 2015-02-02 ENCOUNTER — Encounter: Payer: Self-pay | Admitting: *Deleted

## 2015-02-02 DIAGNOSIS — F17200 Nicotine dependence, unspecified, uncomplicated: Secondary | ICD-10-CM | POA: Insufficient documentation

## 2015-02-03 ENCOUNTER — Telehealth: Payer: Self-pay | Admitting: Urology

## 2015-02-03 ENCOUNTER — Ambulatory Visit (INDEPENDENT_AMBULATORY_CARE_PROVIDER_SITE_OTHER): Payer: Commercial Indemnity | Admitting: Urology

## 2015-02-03 ENCOUNTER — Encounter: Payer: Self-pay | Admitting: Urology

## 2015-02-03 VITALS — BP 132/84 | HR 93 | Resp 18 | Ht 64.0 in | Wt 241.4 lb

## 2015-02-03 DIAGNOSIS — N1330 Unspecified hydronephrosis: Secondary | ICD-10-CM | POA: Insufficient documentation

## 2015-02-03 DIAGNOSIS — N201 Calculus of ureter: Secondary | ICD-10-CM | POA: Diagnosis not present

## 2015-02-03 DIAGNOSIS — R310 Gross hematuria: Secondary | ICD-10-CM | POA: Diagnosis not present

## 2015-02-03 LAB — URINALYSIS, COMPLETE
BILIRUBIN UA: POSITIVE — AB
NITRITE UA: POSITIVE — AB
SPEC GRAV UA: 1.02 (ref 1.005–1.030)
Urobilinogen, Ur: 2 mg/dL — ABNORMAL HIGH (ref 0.2–1.0)
pH, UA: 5.5 (ref 5.0–7.5)

## 2015-02-03 LAB — MICROSCOPIC EXAMINATION: WBC, UA: >30 /hpf — ABNORMAL HIGH (ref 0–?)

## 2015-02-03 LAB — URINE CULTURE

## 2015-02-03 MED ORDER — TAMSULOSIN HCL 0.4 MG PO CAPS: 0.4000 mg | ORAL_CAPSULE | Freq: Every day | ORAL | Status: DC

## 2015-02-03 NOTE — Telephone Encounter (Signed)
Patient will need a pregnancy test prior to her procedure.

## 2015-02-03 NOTE — Progress Notes (Signed)
02/03/2015 10:12 AM   Tanya Henderson July 27, 1975 [id number]  Referring provider: Lucille Passy, MD Tanya Henderson, Tanya Henderson [id number]  Chief Complaint  Patient presents with  . Establish Care    ER referral  . Nephrolithiasis    45m stone. Right renal stent placed on 8/12.     HPI: Tanya Henderson a 39year old white female [facility] sudden onset of right sided pain.  She states that five days ago  in the evening she experienced the sudden onset of right sided pain.  It started just underneath her right breast and radiated to the right flank.  The pain was so intense, she sought further evaluation in the ED.  A non-contrast CT was performed and found an obstructing 7 x 7 mm stone in the proximal right ureter just below the right renal pelvis.  An urology consult was requested and patient underwent emergent right ureteral stent placement by Dr. HLouis Henderson 01/29/2015 and patient was discharged to home.    She returned to the ED two days later complaining of symptoms typical of stent discomfort.  Her pain was managed and she was started on Cipro.  UCx was sent and results were still pending at the time of today's visit.    Today, she is complaining of urinary urgency, frequency and right flank pain when urinating.  She is also experiencing gross hematuria.   She is experiencing some nausea.  She has not passed any fragements.  She does not have a previous history of stones.    KUB taken on 02/01/2015 demonstrates that the stone may have migrated into the  distal right ureter.    PMH: Past Medical History  Diagnosis Date  . Vitamin D deficiency     Surgical History: Past Surgical History  Procedure Laterality Date  . Cholecystectomy    . Oophorectomy Right 1996    Ovarian cyst  . Cystoscopy with stent placement Right 01/29/2015    Procedure: CYSTOSCOPY WITH STENT PLACEMENT;  Surgeon: BArdis Hughs MD;  Location: [facility]  ORS;  Service: Urology;  Laterality: Right;    Home Medications:    Medication List       This list is accurate as of: 02/03/15 10:12 AM.  Always use your most recent med list.               Acetaminophen 500 MG coapsule  Take 1 capsule by mouth every 6 (six) hours as needed.     acetaminophen-codeine 300-30 MG per tablet  Commonly known as:  TYLENOL #3  Take 1 tablet by mouth every 4 (four) hours as needed.     ciprofloxacin 500 MG tablet  Commonly known as:  CIPRO  Take 1 tablet (500 mg total) by mouth 2 (two) times daily.     HYDROmorphone 2 MG tablet  Commonly known as:  DILAUDID  Take 1 tablet (2 mg total) by mouth every 12 (twelve) hours as needed for severe pain.     phenazopyridine 200 MG tablet  Commonly known as:  PYRIDIUM  Take 1 tablet (200 mg total) by mouth 3 (three) times daily as needed for pain.     Trospium Chloride 60 MG Cp24  Take 1 capsule (60 mg total) by mouth daily.        Allergies:  Allergies  Allergen Reactions  . Penicillins     Allergy as a child, unsure of reaction    Family History:  Family History  Problem Relation Age of Onset  . Nephrolithiasis Paternal Grandfather     Social History:  reports that she has been smoking.  She has never used smokeless tobacco. She reports that she drinks alcohol. She reports that she does not use illicit drugs.  ROS: UROLOGY Frequent Urination?: Yes Hard to postpone urination?: No Burning/pain with urination?: Yes Get up at night to urinate?: Yes Leakage of urine?: Yes Urine stream starts and stops?: Yes Trouble starting stream?: No Do you have to strain to urinate?: No Blood in urine?: Yes Urinary tract infection?: No Sexually transmitted disease?: No Injury to kidneys or bladder?: No Painful intercourse?: No Weak stream?: No Currently pregnant?: No Vaginal bleeding?: No Last menstrual period?: 7/16  Gastrointestinal Nausea?: Yes Vomiting?: No Indigestion/heartburn?:  Yes Diarrhea?: No Constipation?: Yes  Constitutional Fever: Yes Night sweats?: No Weight loss?: No Fatigue?: Yes  Skin Skin rash/lesions?: No Itching?: No  Eyes Blurred vision?: No Double vision?: No  Ears/Nose/Throat Sore throat?: Yes Sinus problems?: Yes  Hematologic/Lymphatic Swollen glands?: Yes Easy bruising?: Yes  Cardiovascular Leg swelling?: No Chest pain?: No  Respiratory Cough?: No Shortness of breath?: No  Endocrine Excessive thirst?: Yes  Musculoskeletal Back pain?: Yes Joint pain?: No  Neurological Headaches?: Yes Dizziness?: Yes  Psychologic Depression?: No Anxiety?: Yes  Physical Exam: BP 132/84 mmHg  Pulse 93  Resp 18  Ht _0  (1.626 m)  Wt 241 lb 6.4 oz (109.498 kg)  BMI 41.42 kg/m2  LMP  (LMP Unknown)  Constitutional:  Alert and oriented, No acute distress. HEENT:  AT, moist mucus membranes.  Trachea midline, no masses. Cardiovascular: No clubbing, cyanosis, or edema. Respiratory: Normal respiratory effort, no increased work of breathing. GI: Abdomen is soft, nontender, nondistended, no abdominal masses GT:[id number]  CVA tenderness.  Skin: No rashes, bruises or suspicious lesions. Lymph: No cervical or inguinal adenopathy. Neurologic: Grossly intact, no focal deficits, moving all 4 extremities. Psychiatric: Normal mood and affect.  Laboratory Data: Results for orders placed or performed in visit on 02/03/15  Microscopic Examination  Result Value Ref Range   WBC, UA >30 (H) 0 -  5 /hpf   RBC, UA >30 (H) 0 -  2 /hpf   Epithelial Cells (non renal) 0-10 0 - 10 /hpf   Bacteria, UA Many (A) None seen/Few  Urinalysis, Complete  Result Value Ref Range   Specific Gravity, UA 1.020 1.005 - 1.030   pH, UA 5.5 5.0 - 7.5   Color, UA Brown (A) Yellow   Appearance Ur Cloudy (A) Clear   Leukocytes, UA 3+ (A) Negative   Protein, UA 3+ (A) Negative/Trace   Glucose, UA Trace (A) Negative   Ketones, UA 1+ (A) Negative   RBC, UA 3+ (A)  Negative   Bilirubin, UA Positive (A) Negative   Urobilinogen, Ur 2.0 (H) 0.2 - 1.0 mg/dL   Nitrite, UA Positive (A) Negative   Microscopic Examination See below:    Lab Results  Component Value Date   WBC 12.6* 02/01/2015   HGB 15.7 02/01/2015   HCT 48.0* 02/01/2015   MCV 89.4 02/01/2015   PLT 319 02/01/2015    Lab Results  Component Value Date   CREATININE 0.84 02/01/2015   Urinalysis    Component Value Date/Time   COLORURINE ORANGE* 02/01/2015 1619   APPEARANCEUR CLOUDY* 02/01/2015 1619   LABSPEC 1.025 02/01/2015 1619   PHURINE RESULTS UNAVAILABLE DUE TO INTERFERING SUBSTANCE 02/01/2015 1619   GLUCOSEU RESULTS UNAVAILABLE DUE TO INTERFERING SUBSTANCE* 02/01/2015 1619  HGBUR RESULTS UNAVAILABLE DUE TO INTERFERING SUBSTANCE* 02/01/2015 1619   BILIRUBINUR RESULTS UNAVAILABLE DUE TO INTERFERING SUBSTANCE* 02/01/2015 1619   KETONESUR RESULTS UNAVAILABLE DUE TO INTERFERING SUBSTANCE* 02/01/2015 1619   PROTEINUR RESULTS UNAVAILABLE DUE TO INTERFERING SUBSTANCE* 02/01/2015 1619   UROBILINOGEN 0.2 07/24/2009 0934   NITRITE RESULTS UNAVAILABLE DUE TO INTERFERING SUBSTANCE* 02/01/2015 1619   LEUKOCYTESUR RESULTS UNAVAILABLE DUE TO INTERFERING SUBSTANCE* 02/01/2015 1619    Pertinent Imaging:  CLINICAL DATA: Acute onset of right-sided abdominal pain. Nausea. Initial encounter.  EXAM: CT ABDOMEN AND PELVIS WITH CONTRAST  TECHNIQUE: Multidetector CT imaging of the abdomen and pelvis was performed using the standard protocol following bolus administration of intravenous contrast.  CONTRAST: 164m OMNIPAQUE IOHEXOL 300 MG/ML SOLN  COMPARISON: Pelvic ultrasound performed 05/31/2007  FINDINGS: The visualized lung bases are clear.  The liver and spleen are unremarkable in appearance. The patient is status post cholecystectomy, with clips noted at the gallbladder fossa. The pancreas and adrenal glands are unremarkable.  There is mild right-sided hydronephrosis,  with right-sided perinephric stranding and fluid. An obstructing 7 x 7 mm stone is noted proximally, just below the right renal pelvis. A 3 mm stone is noted at the lower pole of the left kidney. The left kidney is otherwise unremarkable.  No free fluid is identified. The small bowel is unremarkable in appearance. The stomach is within normal limits. No acute vascular abnormalities are seen.  The appendix is normal in caliber, without evidence of appendicitis. Minimal diverticulosis is noted along the proximal sigmoid colon. The colon is otherwise unremarkable.  The bladder is mildly distended and grossly unremarkable. 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 The ovaries are grossly symmetric. No suspicious adnexal masses are seen. No inguinal lymphadenopathy is seen.  No acute osseous abnormalities are identified.  IMPRESSION: 1. Mild right-sided hydronephrosis, with right-sided perinephric stranding and fluid. Obstructing 7 x 7 mm stone noted in the proximal right ureter, just below the right renal pelvis. 2. 3 mm nonobstructing stone at the lower pole of the left kidney. 3. Apparent complete septate uterus noted. 4. Minimal diverticulosis along the proximal sigmoid colon, without evidence of diverticulitis.   Electronically Signed  By: JGarald BaldingM.D.  On: 01/29/2015 02:31    CLINICAL DATA: Stent placement for kidney stone last Thursday.  EXAM: ABDOMEN - 1 VIEW  COMPARISON: CT 01/29/2015  FINDINGS: Right ureteral stent is in place. Probable migration of the stone previously seen in the proximal ureter by CT. A 7 mm calcification projects near the stent just beyond the pelvic brim, likely in the distal ureter. Oral contrast material seen within the colon. Nonobstructive bowel gas pattern. No free air.  IMPRESSION: Probable migration of the right ureteral stone into the distal right ureter just beyond the pelvic brim. Right ureteral stent  in place.   Electronically Signed  By: KRolm BaptiseM.D.  On: 02/01/2015 16:48  Assessment & Plan:    1. Right ureteral stone:   Patient is s/p emergent placement of a right ureteral stent for a 7 x 7 mm ureteral stone on 01/29/2015 by Dr. HLouis Meckel     I explained to the patient that URS/LL/stent exchange is the recommended first line therapy, but ESWL would offer the least morbidity and lower complication rate.   She is mostly interested in the URS for the greater stone-free rate with a single procedure.  She is anxious about missing work  Patient will be scheduled for a right URS/LL/with right ureteral stent exchange for a right  distal 7 x 7 mm stone.  I explained to the patient how the procedure is performed and the risks involved.    I informed patient that she will have a stent placed during the procedure and will remain in place after the procedure for a short time.  It will be removed in the office with a cystoscope, unless a string in left in place.  I informed that patient that about 50% of patients who undergo ureteroscopy and have a stent will have "stent pain," and this is by far the most common risk/complaint following ureteroscopy. A stent is a soft plastic tube (about half the size of IV tubing) that allows the kidney to drain to the bladder regardless of edema or obstruction. Not only can the stent "rub" on the inside of the bladder, causing a feeling of needing to urinate/overactive bladder, but also the stent allows urine to pass up from the bladder to the kidney during urination - causing symptoms from a warm, tingling sensation to intense pain in the affected flank.   They may be residual stones within the kidney or ureter may be present up to 40% of the time following ureteroscopy, depending on the original stone size and location. These stone fragments will be seen and addressed on follow-up imaging.  Injury to the ureter is the most common intra-operative  complication during ureteroscopy. The reported risk of perforation ranges greatly, depending on whether it is defined as a complete perforation (0.1-0.7% - think of this as a hole through the entire ureter), a partial perforation (1.6% - a hole nearly through the entire ureter), or mucosal tear/scrape (5% - these are similar to a sore on the inside of the mouth). Almost 100% of these will heal with prolonged stenting (anywhere between 2 - 4 weeks). Should a large perforation occur, your urologist may chose to stop the procedure and return on another day when the ureter has had time to heal.  I also explained the risks of general anesthesia, such as: MI, CVA, paralysis, coma and/or death.  I have prescribed her tamsulosin and gave her Toviaz samples to help with the stent discomfort.  Tamsulosin may also assist in MET.  - Urinalysis, Complete  2. Right hydronephrosis:   Patient was found to have right hydronephrosis due to an obstructing stone.  A RUS will be obtained one month after she has underwent URS for her right ureteral stone to ensure the hydronephrosis has resolved.    3. Gross hematuria:   This is most likely due to the stent and the ureteral stone.  We will obtain an UA once the stone has been treated to ensure the hematuria has resolved.    No Follow-up on file.  Zara Council, [facility] [address] [place], [place] [address] [phone]

## 2015-02-04 NOTE — Telephone Encounter (Signed)
Blood test

## 2015-02-04 NOTE — Telephone Encounter (Signed)
Blood levels or OTC test?

## 2015-02-04 NOTE — Telephone Encounter (Signed)
LMOM

## 2015-02-05 ENCOUNTER — Telehealth: Payer: Self-pay

## 2015-02-05 NOTE — Telephone Encounter (Signed)
Pt called c/o severe pain and not being able to sleep last night. Pt described s/s as intermittent pain, 10/10, fever(unknown number), chills, frequency, not being able to sleep and current pain medication not helping. Per Carollee Herter pt was advised to go to the ER. Pt voiced understanding.

## 2015-02-05 NOTE — Telephone Encounter (Signed)
Spoke with patient and gave her pre-op date and time 02-08-15@9am  and stated that they would do a urine pregnancy test  Then. This was added to her orders per Carollee Herter and refaxed to pre-admit

## 2015-02-08 ENCOUNTER — Encounter: Payer: Self-pay | Admitting: *Deleted

## 2015-02-08 ENCOUNTER — Encounter
Admission: RE | Admit: 2015-02-08 | Discharge: 2015-02-08 | Disposition: A | Discharge status: Home / Self Care | Payer: Commercial Indemnity | Source: Ambulatory Visit | Attending: Urology | Admitting: Urology

## 2015-02-08 DIAGNOSIS — Z90721 Acquired absence of ovaries, unilateral: Secondary | ICD-10-CM | POA: Diagnosis not present

## 2015-02-08 DIAGNOSIS — N201 Calculus of ureter: Secondary | ICD-10-CM | POA: Diagnosis present

## 2015-02-08 DIAGNOSIS — Z9889 Other specified postprocedural states: Secondary | ICD-10-CM | POA: Diagnosis not present

## 2015-02-08 DIAGNOSIS — N200 Calculus of kidney: Secondary | ICD-10-CM | POA: Diagnosis not present

## 2015-02-08 DIAGNOSIS — F17200 Nicotine dependence, unspecified, uncomplicated: Secondary | ICD-10-CM | POA: Diagnosis not present

## 2015-02-08 DIAGNOSIS — Z841 Family history of disorders of kidney and ureter: Secondary | ICD-10-CM | POA: Diagnosis not present

## 2015-02-08 DIAGNOSIS — Z880 Allergy status to penicillin: Secondary | ICD-10-CM | POA: Diagnosis not present

## 2015-02-08 NOTE — Patient Instructions (Signed)
  Your procedure is scheduled on:8/24 Report to Day Surgery To find out your arrival time please call [phone] between 1PM - 3PM on 8/23.  Remember: Instructions that are not followed completely may result in serious medical risk, up to and including death, or upon the discretion of your surgeon and anesthesiologist your surgery may need to be rescheduled.    ___x_ 1. Do not eat food or drink liquids after midnight. No gum chewing or hard candies.     ___x_ 2. No Alcohol for 24 hours before or after surgery.   ____ 3. Bring all medications with you on the day of surgery if instructed.    ____ 4. Notify your doctor if there is any change in your medical condition     (cold, fever, infections).     Do not wear jewelry, make-up, hairpins, clips or nail polish.  Do not wear lotions, powders, or perfumes. You may wear deodorant.  Do not shave 48 hours prior to surgery. Men may shave face and neck.  Do not bring valuables to the hospital.    [facility] is not responsible for any belongings or valuables.               Contacts, dentures or bridgework may not be worn into surgery.  Leave your suitcase in the car. After surgery it may be brought to your room.  For patients admitted to the hospital, discharge time is determined by your                treatment team.   Patients discharged the day of surgery will not be allowed to drive home.   Please read over the following fact sheets that you were given:   Surgical Site Infection Prevention   ____ Take these medicines the morning of surgery with A SIP OF WATER:    1. Pain med   2.   3.   4.  5.  6.  ____ Fleet Enema (as directed)   ____ Use CHG Soap as directed  ____ Use inhalers on the day of surgery  ____ Stop metformin 2 days prior to surgery    ____ Take 1/2 of usual insulin dose the night before surgery and none on the morning of surgery.   ____ Stop Coumadin/Plavix/aspirin on   ____ Stop Anti-inflammatories on     ____ Stop supplements until after surgery.    ____ Bring C-Pap to the hospital.

## 2015-02-10 ENCOUNTER — Ambulatory Visit: Payer: Commercial Indemnity | Admitting: Anesthesiology

## 2015-02-10 ENCOUNTER — Encounter: Payer: Self-pay | Admitting: *Deleted

## 2015-02-10 ENCOUNTER — Ambulatory Visit
Admission: RE | Admit: 2015-02-10 | Discharge: 2015-02-10 | Disposition: A | Discharge status: Home / Self Care | Payer: Commercial Indemnity | Source: Ambulatory Visit | Attending: Urology | Admitting: Urology

## 2015-02-10 ENCOUNTER — Encounter
Admission: RE | Disposition: A | Discharge status: Home / Self Care | Payer: Self-pay | Source: Ambulatory Visit | Attending: Urology

## 2015-02-10 DIAGNOSIS — Z90721 Acquired absence of ovaries, unilateral: Secondary | ICD-10-CM | POA: Insufficient documentation

## 2015-02-10 DIAGNOSIS — F17200 Nicotine dependence, unspecified, uncomplicated: Secondary | ICD-10-CM | POA: Insufficient documentation

## 2015-02-10 DIAGNOSIS — Z841 Family history of disorders of kidney and ureter: Secondary | ICD-10-CM | POA: Insufficient documentation

## 2015-02-10 DIAGNOSIS — N200 Calculus of kidney: Secondary | ICD-10-CM | POA: Diagnosis not present

## 2015-02-10 DIAGNOSIS — Z880 Allergy status to penicillin: Secondary | ICD-10-CM | POA: Insufficient documentation

## 2015-02-10 DIAGNOSIS — Z9889 Other specified postprocedural states: Secondary | ICD-10-CM | POA: Insufficient documentation

## 2015-02-10 HISTORY — PX: CYSTOSCOPY W/ URETERAL STENT REMOVAL: SHX1430

## 2015-02-10 HISTORY — PX: CYSTOSCOPY WITH STENT PLACEMENT: SHX5790

## 2015-02-10 HISTORY — PX: URETEROSCOPY WITH HOLMIUM LASER LITHOTRIPSY: SHX6645

## 2015-02-10 LAB — POCT PREGNANCY, URINE: PREG TEST UR: NEGATIVE

## 2015-02-10 SURGERY — URETEROSCOPY, WITH LITHOTRIPSY USING HOLMIUM LASER
Anesthesia: General | Laterality: Right | Wound class: Clean Contaminated

## 2015-02-10 MED ORDER — FAMOTIDINE 20 MG PO TABS
ORAL_TABLET | ORAL | Status: AC
  Administered 2015-02-10: 20 mg via ORAL
  Filled 2015-02-10: qty 1

## 2015-02-10 MED ORDER — SUCCINYLCHOLINE CHLORIDE 20 MG/ML IJ SOLN
INTRAMUSCULAR | Status: DC | PRN
  Administered 2015-02-10: 100 mg via INTRAVENOUS

## 2015-02-10 MED ORDER — LACTATED RINGERS IV SOLN
INTRAVENOUS | Status: DC
  Administered 2015-02-10: 09:00:00 via INTRAVENOUS

## 2015-02-10 MED ORDER — ONDANSETRON HCL 4 MG/2ML IJ SOLN
INTRAMUSCULAR | Status: DC | PRN
  Administered 2015-02-10: 4 mg via INTRAVENOUS

## 2015-02-10 MED ORDER — DIPHENHYDRAMINE HCL 50 MG/ML IJ SOLN
INTRAMUSCULAR | Status: DC | PRN
  Administered 2015-02-10: 25 mg via INTRAVENOUS

## 2015-02-10 MED ORDER — ONDANSETRON HCL 4 MG/2ML IJ SOLN
INTRAMUSCULAR | Status: AC
  Filled 2015-02-10: qty 2

## 2015-02-10 MED ORDER — FENTANYL CITRATE (PF) 100 MCG/2ML IJ SOLN
25.0000 ug | INTRAMUSCULAR | Status: DC | PRN
  Administered 2015-02-10 (×4): 25 ug via INTRAVENOUS

## 2015-02-10 MED ORDER — BELLADONNA ALKALOIDS-OPIUM 16.2-60 MG RE SUPP
RECTAL | Status: AC
  Filled 2015-02-10: qty 1

## 2015-02-10 MED ORDER — BUPIVACAINE HCL 0.5 % IJ SOLN
INTRAMUSCULAR | Status: DC | PRN
Start: 2015-02-10 — End: 2015-02-10
  Administered 2015-02-10: 30 mL

## 2015-02-10 MED ORDER — KETAMINE HCL 50 MG/ML IJ SOLN
INTRAMUSCULAR | Status: DC | PRN
  Administered 2015-02-10: 30 mg via INTRAMUSCULAR

## 2015-02-10 MED ORDER — NEOSTIGMINE METHYLSULFATE 10 MG/10ML IV SOLN
INTRAVENOUS | Status: DC | PRN
  Administered 2015-02-10: 3 mg via INTRAVENOUS

## 2015-02-10 MED ORDER — MIDAZOLAM HCL 2 MG/2ML IJ SOLN
INTRAMUSCULAR | Status: DC | PRN
  Administered 2015-02-10: 2 mg via INTRAVENOUS

## 2015-02-10 MED ORDER — ROCURONIUM BROMIDE 100 MG/10ML IV SOLN
INTRAVENOUS | Status: DC | PRN
  Administered 2015-02-10: 25 mg via INTRAVENOUS

## 2015-02-10 MED ORDER — HYDROMORPHONE HCL 2 MG PO TABS: 2.0000 mg | ORAL_TABLET | ORAL | Status: DC | PRN

## 2015-02-10 MED ORDER — BUPIVACAINE HCL (PF) 0.5 % IJ SOLN
INTRAMUSCULAR | Status: AC
Start: 2015-02-10 — End: 2015-02-10
  Filled 2015-02-10: qty 30

## 2015-02-10 MED ORDER — DEXAMETHASONE SODIUM PHOSPHATE 4 MG/ML IJ SOLN
INTRAMUSCULAR | Status: DC | PRN
  Administered 2015-02-10 (×2): 5 mg via INTRAVENOUS

## 2015-02-10 MED ORDER — FENTANYL CITRATE (PF) 100 MCG/2ML IJ SOLN
INTRAMUSCULAR | Status: AC
  Filled 2015-02-10: qty 2

## 2015-02-10 MED ORDER — GLYCOPYRROLATE 0.2 MG/ML IJ SOLN
INTRAMUSCULAR | Status: DC | PRN
  Administered 2015-02-10: .5 mg via INTRAVENOUS

## 2015-02-10 MED ORDER — PROPOFOL 10 MG/ML IV BOLUS
INTRAVENOUS | Status: DC | PRN
  Administered 2015-02-10: 200 mg via INTRAVENOUS

## 2015-02-10 MED ORDER — CIPROFLOXACIN IN D5W 400 MG/200ML IV SOLN
INTRAVENOUS | Status: AC
  Administered 2015-02-10: 400 mg via INTRAVENOUS
  Filled 2015-02-10: qty 200

## 2015-02-10 MED ORDER — FAMOTIDINE 20 MG PO TABS
20.0000 mg | ORAL_TABLET | Freq: Once | ORAL | Status: AC
  Administered 2015-02-10: 20 mg via ORAL

## 2015-02-10 MED ORDER — ONDANSETRON HCL 4 MG/2ML IJ SOLN
4.0000 mg | Freq: Once | INTRAMUSCULAR | Status: AC | PRN
  Administered 2015-02-10: 4 mg via INTRAVENOUS

## 2015-02-10 MED ORDER — KETOROLAC TROMETHAMINE 30 MG/ML IJ SOLN
INTRAMUSCULAR | Status: DC | PRN
  Administered 2015-02-10: 30 mg via INTRAVENOUS

## 2015-02-10 MED ORDER — BUPIVACAINE HCL (PF) 0.5 % IJ SOLN
INTRAMUSCULAR | Status: AC
  Filled 2015-02-10: qty 30

## 2015-02-10 MED ORDER — LIDOCAINE HCL (CARDIAC) 20 MG/ML IV SOLN
INTRAVENOUS | Status: DC | PRN
  Administered 2015-02-10: 50 mg via INTRAVENOUS

## 2015-02-10 MED ORDER — CIPROFLOXACIN IN D5W 400 MG/200ML IV SOLN: 400.0000 mg | Freq: Two times a day (BID) | INTRAVENOUS | Status: DC

## 2015-02-10 MED ORDER — FENTANYL CITRATE (PF) 100 MCG/2ML IJ SOLN
INTRAMUSCULAR | Status: DC | PRN
  Administered 2015-02-10: 150 ug via INTRAVENOUS

## 2015-02-10 MED ORDER — BELLADONNA ALKALOIDS-OPIUM 16.2-60 MG RE SUPP
RECTAL | Status: DC | PRN
  Administered 2015-02-10: 1 via RECTAL

## 2015-02-10 SURGICAL SUPPLY — 31 items
BAG DRAIN CYSTO-URO LG1000N (MISCELLANEOUS) ×3 IMPLANT
CATH URETL 5X70 OPEN END (CATHETERS) IMPLANT
CNTNR SPEC 2.5X3XGRAD LEK (MISCELLANEOUS)
CONRAY 43 FOR UROLOGY 50M (MISCELLANEOUS) ×3 IMPLANT
CONT SPEC 4OZ STER OR WHT (MISCELLANEOUS)
CONTAINER SPEC 2.5X3XGRAD LEK (MISCELLANEOUS) IMPLANT
FEE TECHNICIAN ONLY PER HOUR (MISCELLANEOUS) IMPLANT
GLOVE BIO SURGEON STRL SZ7 (GLOVE) ×6 IMPLANT
GLOVE BIO SURGEON STRL SZ7.5 (GLOVE) ×3 IMPLANT
GOWN STRL REUS W/ TWL LRG LVL3 (GOWN DISPOSABLE) ×1 IMPLANT
GOWN STRL REUS W/ TWL XL LVL3 (GOWN DISPOSABLE) ×1 IMPLANT
GOWN STRL REUS W/TWL LRG LVL3 (GOWN DISPOSABLE) ×2
GOWN STRL REUS W/TWL XL LVL3 (GOWN DISPOSABLE) ×2
GUIDEWIRE STR ZIPWIRE 035X150 (MISCELLANEOUS) ×3 IMPLANT
INTRODUCER DILATOR DOUBLE (INTRODUCER) ×3 IMPLANT
JELLY LUB 2OZ STRL (MISCELLANEOUS) ×2
JELLY LUBE 2OZ STRL (MISCELLANEOUS) ×1 IMPLANT
KIT RM TURNOVER CYSTO AR (KITS) ×3 IMPLANT
LASER HOLMIUM FIBER SU 272UM (MISCELLANEOUS) IMPLANT
LASER HOLMIUM PROCEDURE (MISCELLANEOUS) ×3 IMPLANT
PACK CYSTO AR (MISCELLANEOUS) ×3 IMPLANT
PREP PVP WINGED SPONGE (MISCELLANEOUS) ×3 IMPLANT
SENSORWIRE 0.038 NOT ANGLED (WIRE) ×3
SET CYSTO W/LG BORE CLAMP LF (SET/KITS/TRAYS/PACK) ×3 IMPLANT
SHEATH URETERAL 13/15X36 1L (SHEATH) IMPLANT
SOL .9 NS 3000ML IRR  AL (IV SOLUTION) ×2
SOL .9 NS 3000ML IRR UROMATIC (IV SOLUTION) ×1 IMPLANT
SOL PREP PVP 2OZ (MISCELLANEOUS) ×3
SOLUTION PREP PVP 2OZ (MISCELLANEOUS) ×1 IMPLANT
WATER STERILE IRR 1000ML POUR (IV SOLUTION) ×3 IMPLANT
WIRE SENSOR 0.038 NOT ANGLED (WIRE) ×1 IMPLANT

## 2015-02-10 NOTE — Anesthesia Procedure Notes (Signed)
Procedure Name: Intubation Date/Time: 02/10/2015 9:11 AM Performed by: Mathews Argyle Pre-anesthesia Checklist: Patient identified, Patient being monitored, Timeout performed, Emergency Drugs available and Suction available Patient Re-evaluated:Patient Re-evaluated prior to inductionOxygen Delivery Method: Circle system utilized Preoxygenation: Pre-oxygenation with 100% oxygen Intubation Type: IV induction Ventilation: Mask ventilation without difficulty Laryngoscope Size: Miller and 2 Grade View: Grade I Tube type: Oral Tube size: 7.0 mm Number of attempts: 1 Airway Equipment and Method: Stylet Placement Confirmation: ETT inserted through vocal cords under direct vision,  positive ETCO2 and breath sounds checked- equal and bilateral Secured at: 21 cm Tube secured with: Tape Dental Injury: Teeth and Oropharynx as per pre-operative assessment

## 2015-02-10 NOTE — Op Note (Signed)
Preop ureteral calculous Postop renal calculous Procedure  Cysto, right retrograde pyelogram, ureteroscopy stent Anes: general  With the patient sterile draped,in the supine lithotomy position for ease of approach to the external genitalia we begin the procedure.  A time-out is taken and then with a 21FR Cystoscope shealth we ender the bladder.  30 degree lens is utilized.  Right rigid ureteroscopy is done and no calculus is located in the ureter No filling defect is seen in the ureter but a calculous is seen in the right lower calyx.  A double lumen ureteral access catheter is put up over a 0.35 sensor wire.  A second wire is put up thru the catheter and then the dilator is removed.   A digital flexible scope is placed up the ureter over the Glidewire and beyond  narrowing of the mid ureter.  A digital scope goes to the kidney.  A calculous is located in the lower pole been disintegrated to multiple small pieces  are  too small to put into a basket.  200  frond firing holmium laser fiber was utilized for this The scope iremoved and no stent is placed  . the bladder is emptied thru the cystoscope sheath.  30ml of 0.5% marcaine is put in the bladder and sheath is withdrawn.    A60mg  Belladonna and opium suppository is placed in the rectum.  There a normal rectal exam is completed.  The bladder itself showed no turmors, masses or calculi  The patient was sent to the recovery room in satisfactory condition.

## 2015-02-10 NOTE — Anesthesia Preprocedure Evaluation (Addendum)
Anesthesia Evaluation  Patient identified by MRN, date of birth, ID band Patient awake    Reviewed: Allergy & Precautions, NPO status , Patient's Chart, lab work & pertinent test results  Airway Mallampati: II  TM Distance: >3 FB Neck ROM: Full    Dental no notable dental hx.    Pulmonary Current Smoker,    Pulmonary exam normal       Cardiovascular negative cardio ROS Normal cardiovascular exam    Neuro/Psych  Headaches, Depression    GI/Hepatic Neg liver ROS, GERD-  Medicated and Controlled,  Endo/Other    Renal/GU Kidney stones  negative genitourinary   Musculoskeletal negative musculoskeletal ROS (+)   Abdominal Normal abdominal exam  (+)   Peds negative pediatric ROS (+)  Hematology negative hematology ROS (+)   Anesthesia Other Findings   Reproductive/Obstetrics                            Anesthesia Physical Anesthesia Plan  ASA: II  Anesthesia Plan: General   Post-op Pain Management:    Induction: Intravenous  Airway Management Planned: Oral ETT  Additional Equipment:   Intra-op Plan:   Post-operative Plan: Extubation in OR  Informed Consent: I have reviewed the patients History and Physical, chart, labs and discussed the procedure including the risks, benefits and alternatives for the proposed anesthesia with the patient or authorized representative who has indicated his/her understanding and acceptance.   Dental advisory given  Plan Discussed with: CRNA and Surgeon  Anesthesia Plan Comments:         Anesthesia Quick Evaluation

## 2015-02-10 NOTE — Anesthesia Postprocedure Evaluation (Signed)
  Anesthesia Post-op Note  Patient: Tanya Henderson  Procedure(s) Performed: Procedure(s): URETEROSCOPY WITH HOLMIUM LASER LITHOTRIPSY (Right) CYSTOSCOPY WITH STENT REMOVAL (Right) CYSTOSCOPY WITH STENT PLACEMENT (Right)  Anesthesia type:General  Patient location: PACU  Post pain: Pain level controlled  Post assessment: Post-op Vital signs reviewed, Patient's Cardiovascular Status Stable, Respiratory Function Stable, Patent Airway and No signs of Nausea or vomiting  Post vital signs: Reviewed and stable  Last Vitals:  Filed Vitals:   02/10/15 1147  BP: 138/70  Pulse: 68  Temp: 36.9 C  Resp: 18    Level of consciousness: awake, alert  and patient cooperative  Complications: No apparent anesthesia complications

## 2015-02-10 NOTE — H&P (Signed)
Right ureteral calculous  Plan right ureteroscopy and laser lithotripsy.  HS RRR and Lungs CTA  Patient marked and all concerns addressed

## 2015-02-10 NOTE — Transfer of Care (Signed)
Immediate Anesthesia Transfer of Care Note  Patient: Tanya Henderson  Procedure(s) Performed: Procedure(s): URETEROSCOPY WITH HOLMIUM LASER LITHOTRIPSY (Right) CYSTOSCOPY WITH STENT REMOVAL (Right) CYSTOSCOPY WITH STENT PLACEMENT (Right)  Patient Location: PACU  Anesthesia Type:General  Level of Consciousness: awake, alert , oriented and patient cooperative  Airway & Oxygen Therapy: Patient Spontanous Breathing and Patient connected to nasal cannula oxygen  Post-op Assessment: Report given to RN and Post -op Vital signs reviewed and stable  Post vital signs: Reviewed and stable  Last Vitals:  Filed Vitals:   02/10/15 1007  BP: 159/85  Pulse: 86  Temp: 36.3 C  Resp: 15    Complications: No apparent anesthesia complications

## 2015-02-11 ENCOUNTER — Encounter: Payer: Self-pay | Admitting: Urology

## 2015-02-12 LAB — POCT PREGNANCY, URINE: Preg Test, Ur: NEGATIVE

## 2015-02-15 ENCOUNTER — Telehealth: Payer: Self-pay | Admitting: Urology

## 2015-02-15 ENCOUNTER — Emergency Department
Admission: EM | Admit: 2015-02-15 | Discharge: 2015-02-15 | Disposition: A | Discharge status: Home / Self Care | Payer: Commercial Indemnity | Attending: Emergency Medicine | Admitting: Emergency Medicine

## 2015-02-15 ENCOUNTER — Encounter: Payer: Self-pay | Admitting: Emergency Medicine

## 2015-02-15 DIAGNOSIS — Z880 Allergy status to penicillin: Secondary | ICD-10-CM | POA: Diagnosis not present

## 2015-02-15 DIAGNOSIS — Z720 Tobacco use: Secondary | ICD-10-CM | POA: Diagnosis not present

## 2015-02-15 DIAGNOSIS — Z3202 Encounter for pregnancy test, result negative: Secondary | ICD-10-CM | POA: Insufficient documentation

## 2015-02-15 DIAGNOSIS — Z9049 Acquired absence of other specified parts of digestive tract: Secondary | ICD-10-CM | POA: Insufficient documentation

## 2015-02-15 DIAGNOSIS — N200 Calculus of kidney: Secondary | ICD-10-CM

## 2015-02-15 DIAGNOSIS — R109 Unspecified abdominal pain: Secondary | ICD-10-CM | POA: Insufficient documentation

## 2015-02-15 LAB — URINALYSIS COMPLETE WITH MICROSCOPIC (ARMC ONLY)
Bilirubin Urine: NEGATIVE
Glucose, UA: NEGATIVE mg/dL
KETONES UR: NEGATIVE mg/dL
LEUKOCYTES UA: NEGATIVE
Nitrite: NEGATIVE
PH: 8 (ref 5.0–8.0)
PROTEIN: NEGATIVE mg/dL
SPECIFIC GRAVITY, URINE: 1.011 (ref 1.005–1.030)

## 2015-02-15 LAB — CBC
HCT: 48.2 % — ABNORMAL HIGH (ref 35.0–47.0)
Hemoglobin: 16.5 g/dL — ABNORMAL HIGH (ref 12.0–16.0)
MCH: 30.2 pg (ref 26.0–34.0)
MCHC: 34.2 g/dL (ref 32.0–36.0)
MCV: 88.1 fL (ref 80.0–100.0)
PLATELETS: 395 10*3/uL (ref 150–440)
RBC: 5.47 MIL/uL — ABNORMAL HIGH (ref 3.80–5.20)
RDW: 13.1 % (ref 11.5–14.5)
WBC: 9.2 10*3/uL (ref 3.6–11.0)

## 2015-02-15 LAB — POCT PREGNANCY, URINE: Preg Test, Ur: NEGATIVE

## 2015-02-15 NOTE — ED Provider Notes (Signed)
[facility] Emergency Department Provider Note    ____________________________________________  Time seen: 1445  I have reviewed the triage vital signs and the nursing notes.   HISTORY  Chief Complaint Flank Pain   History limited by: Not Limited   HPI Tanya Henderson is a 39 y.o. female who presents to the emergency department today because of concerns for right flank pain. The patient states that she underwent a lithotripsy 5 days ago for a stone on the right side. She then started having some pain to morning's ago and yesterday morning. She states the pain was sharp and lasted a couple of hours. She states that the pain actually was not that bad today. She denies any nausea or vomiting. Denies any recent fevers. She did talk with staff at the urologist office where they work when to schedule her for an outpatient ultrasound. She came to the emergency department today because she did not want to wait.     Past Medical History  Diagnosis Date  . Vitamin D deficiency     Patient Active Problem List   Diagnosis Date Noted  . Right ureteral stone 02/03/2015  . Hydronephrosis, right 02/03/2015  . Gross hematuria 02/03/2015  . 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 Biliary calculi 12/16/2013  . Clinical depression 12/16/2013  . Gastro-esophageal reflux disease without esophagitis 12/16/2013  . Headache, migraine 12/16/2013  . Unspecified vitamin D deficiency 01/13/2013  . Hidradenitis 01/13/2013    Past Surgical History  Procedure Laterality Date  . Cholecystectomy    . Oophorectomy Right 1996    Ovarian cyst  . Cystoscopy with stent placement Right 01/29/2015    Procedure: CYSTOSCOPY WITH STENT PLACEMENT;  Surgeon: Crist Fat, MD;  Location: [facility] ORS;  Service: Urology;  Laterality: Right;  . Ureteroscopy with holmium laser lithotripsy Right 02/10/2015    Procedure: URETEROSCOPY WITH HOLMIUM LASER LITHOTRIPSY;  Surgeon:  Lorraine Lax, MD;  Location: [facility] OR[facility];  Service: Urology;  Laterality: Right;  . Cystoscopy w/ ureteral stent removal Right 02/10/2015    Procedure: CYSTOSCOPY WITH STENT REMOVAL;  Surgeon: Lorraine Lax, MD;  Location: [facility] OR[facility];  Service: Urology;  Laterality: Right;  . Cystoscopy with stent placement Right 02/10/2015    Procedure: CYSTOSCOPY WITH STENT PLACEMENT;  Surgeon: Lorraine Lax, MD;  Location: [facility] ORS;  Service: Urology;  Laterality: Right;    Current Outpatient Rx  Name  Route  Sig  Dispense  Refill  . HYDROmorphone (DILAUDID) 2 MG tablet   Oral   Take 1 tablet (2 mg total) by mouth every 12 (twelve) hours as needed for severe pain.   20 tablet   0   . HYDROmorphone (DILAUDID) 2 MG tablet   Oral   Take 1 tablet (2 mg total) by mouth every 4 (four) hours as needed for severe pain.   30 tablet   0     Allergies Penicillins  Family History  Problem Relation Age of Onset  . Nephrolithiasis Paternal Grandfather     Social History Social History  Substance Use Topics  . Smoking status: Current Every Day Smoker -- 1.00 packs/day  . Smokeless tobacco: Never Used  . Alcohol Use: Yes     Comment: rare    Review of Systems  Constitutional: Negative for fever. Cardiovascular: Negative for chest pain. Respiratory: Negative for shortness of breath. Gastrointestinal: Positive for right flank pain Genitourinary: Negative for dysuria. Musculoskeletal: Negative for back pain. Skin: Negative for rash. Neurological: Negative for headaches,  focal weakness or numbness.  10-point ROS otherwise negative.  ____________________________________________   PHYSICAL EXAM:  VITAL SIGNS: ED Triage Vitals  Enc Vitals Group     BP 02/15/15 1303 131/90 mmHg     Pulse Rate 02/15/15 1303 93     Resp 02/15/15 1303 20     Temp 02/15/15 1303 98.8 F (37.1 C)     Temp Source 02/15/15 1303 Oral     SpO2 02/15/15 1303 98 %     Weight 02/15/15 1303 240 lb (108.863 kg)      Height 02/15/15 1303 5\' 4"  (1.626 m)     Head Cir --      Peak Flow --      Pain Score 02/15/15 1311 4   Constitutional: Alert and oriented. Well appearing and in no distress. Eyes: Conjunctivae are normal. PERRL. Normal extraocular movements. ENT   Head: Normocephalic and atraumatic.   Nose: No congestion/rhinnorhea.   Mouth/Throat: Mucous membranes are moist.   Neck: No stridor. Hematological/Lymphatic/Immunilogical: No cervical lymphadenopathy. Cardiovascular: Normal rate, regular rhythm.  No murmurs, rubs, or gallops. Respiratory: Normal respiratory effort without tachypnea nor retractions. Breath sounds are clear and equal bilaterally. No wheezes/rales/rhonchi. Gastrointestinal: Soft and nontender. No distention. There is no CVA tenderness. Genitourinary: Deferred Musculoskeletal: Normal range of motion in all extremities. No joint effusions.  No lower extremity tenderness nor edema. Neurologic:  Normal speech and language. No gross focal neurologic deficits are appreciated. Speech is normal.  Skin:  Skin is warm, dry and intact. No rash noted. Psychiatric: Mood and affect are normal. Speech and behavior are normal. Patient exhibits appropriate insight and judgment.  ____________________________________________    LABS (pertinent positives/negatives)  Labs Reviewed  URINALYSIS COMPLETEWITH MICROSCOPIC ([facility] ONLY) - Abnormal; Notable for the following:    Color, Urine YELLOW (*)    APPearance CLOUDY (*)    Hgb urine dipstick 1+ (*)    Bacteria, UA RARE (*)    Squamous Epithelial / LPF 0-5 (*)    All other components within normal limits  CBC - Abnormal; Notable for the following:    RBC 5.47 (*)    Hemoglobin 16.5 (*)    HCT 48.2 (*)    All other components within normal limits  BASIC METABOLIC PANEL  POCT PREGNANCY, URINE     ____________________________________________   EKG  None  ____________________________________________     RADIOLOGY  None  ____________________________________________   PROCEDURES  Procedure(s) performed: None  Critical Care performed: No  ____________________________________________   INITIAL IMPRESSION / ASSESSMENT AND PLAN / ED COURSE  Pertinent labs & imaging results that were available during my care of the patient were reviewed by me and considered in my medical decision making (see chart for details).  Patient presents to the emergency department today with concerns for right flank pain. On exam patient in no distress. No CVA tenderness. Bedside ultrasound did not show any hydronephrosis on the right side. Urine did show some red blood cells. But no signs of infection either in the urine or blood. Again patient appears very well. Will plan on having patient follow up with Dr. Edwyna Shell in clinic.  ____________________________________________   FINAL CLINICAL IMPRESSION(S) / ED DIAGNOSES  Final diagnoses:  Right flank pain     Phineas Semen, MD 02/15/15 1714

## 2015-02-15 NOTE — Discharge Instructions (Signed)
Please seek medical attention for any high fevers, chest pain, shortness of breath, change in behavior, persistent vomiting, bloody stool or any other new or concerning symptoms. ° °Flank Pain °Flank pain refers to pain that is located on the side of the body between the upper abdomen and the back. The pain may occur over a short period of time (acute) or may be long-term or reoccurring (chronic). It may be mild or severe. Flank pain can be caused by many things. °CAUSES  °Some of the more common causes of flank pain include: °· Muscle strains.   °· Muscle spasms.   °· A disease of your spine (vertebral disk disease).   °· A lung infection (pneumonia).   °· Fluid around your lungs (pulmonary edema).   °· A kidney infection.   °· Kidney stones.   °· A very painful skin rash caused by the chickenpox virus (shingles).   °· Gallbladder disease.   °HOME CARE INSTRUCTIONS  °Home care will depend on the cause of your pain. In general, °· Rest as directed by your caregiver. °· Drink enough fluids to keep your urine clear or pale yellow. °· Only take over-the-counter or prescription medicines as directed by your caregiver. Some medicines may help relieve the pain. °· Tell your caregiver about any changes in your pain. °· Follow up with your caregiver as directed. °SEEK IMMEDIATE MEDICAL CARE IF:  °· Your pain is not controlled with medicine.   °· You have new or worsening symptoms. °· Your pain increases.   °· You have abdominal pain.   °· You have shortness of breath.   °· You have persistent nausea or vomiting.   °· You have swelling in your abdomen.   °· You feel faint or pass out.   °· You have blood in your urine. °· You have a fever or persistent symptoms for more than 2-3 days. °· You have a fever and your symptoms suddenly get worse. °MAKE SURE YOU:  °· Understand these instructions. °· Will watch your condition. °· Will get help right away if you are not doing well or get worse. °Document Released: 07/27/2005  Document Revised: 02/28/2012 Document Reviewed: 01/18/2012 °Exit[employer]® Patient Information ©2015 [employer]. This information is not intended to replace advice given to you by your health care provider. Make sure you discuss any questions you have with your health care provider. ° °

## 2015-02-15 NOTE — ED Notes (Signed)
Pt states she had lithotripsy on last Wednesday and then on Saturday night she was awaken from her sleep with right flank pain and then on Sunday night the same happened, states pain is mild at present, denies any urinary symptoms

## 2015-02-15 NOTE — Telephone Encounter (Signed)
Patient called stating that over the weekend she experienced a few episodes of extreme right flank pain,but it is only intermittent pain. She is not having any urinary symptoms or fever, chills, nausea, or vomiting. After speaking with Zola Button patient was told to monitor the pain and to let us know if her symptoms worsen and utilize pain medication when needed.  We will order a renal u/s to make sure there is no hydronephrosis and order was placed and they will call the patient to schedule this week. We will call with results, patient is to otherwise keep her follow up appointment with Dr. Edwyna Shell in 2-3weeks.

## 2015-02-16 ENCOUNTER — Other Ambulatory Visit: Payer: Self-pay

## 2015-02-16 DIAGNOSIS — N200 Calculus of kidney: Secondary | ICD-10-CM

## 2015-03-03 ENCOUNTER — Ambulatory Visit: Payer: Commercial Managed Care - PPO | Admitting: Urology

## 2015-03-04 ENCOUNTER — Ambulatory Visit: Payer: Commercial Indemnity | Admitting: Urology

## 2015-03-23 ENCOUNTER — Encounter: Payer: Self-pay | Admitting: Urology

## 2015-03-23 ENCOUNTER — Ambulatory Visit: Payer: Commercial Indemnity | Admitting: Urology

## 2015-04-08 ENCOUNTER — Ambulatory Visit
Admission: RE | Admit: 2015-04-08 | Discharge: 2015-04-08 | Disposition: A | Discharge status: Home / Self Care | Payer: Commercial Indemnity | Source: Ambulatory Visit | Attending: Urology | Admitting: Urology

## 2015-04-08 DIAGNOSIS — N200 Calculus of kidney: Secondary | ICD-10-CM

## 2015-04-08 DIAGNOSIS — Z87442 Personal history of urinary calculi: Secondary | ICD-10-CM | POA: Diagnosis not present

## 2015-04-09 ENCOUNTER — Encounter: Payer: Self-pay | Admitting: Urology

## 2015-04-09 ENCOUNTER — Ambulatory Visit (INDEPENDENT_AMBULATORY_CARE_PROVIDER_SITE_OTHER): Payer: Commercial Indemnity | Admitting: Urology

## 2015-04-09 VITALS — BP 125/78 | HR 80 | Ht 64.0 in | Wt 242.5 lb

## 2015-04-09 DIAGNOSIS — N200 Calculus of kidney: Secondary | ICD-10-CM

## 2015-04-09 LAB — MICROSCOPIC EXAMINATION
Renal Epithel, UA: NONE SEEN /hpf
WBC UA: NONE SEEN /HPF (ref 0–?)

## 2015-04-09 LAB — URINALYSIS, COMPLETE
Bilirubin, UA: NEGATIVE
Glucose, UA: NEGATIVE
KETONES UA: NEGATIVE
Leukocytes, UA: NEGATIVE
NITRITE UA: NEGATIVE
Protein, UA: NEGATIVE
SPEC GRAV UA: 1.02 (ref 1.005–1.030)
Urobilinogen, Ur: 0.2 mg/dL (ref 0.2–1.0)
pH, UA: 7.5 (ref 5.0–7.5)

## 2015-04-09 NOTE — Progress Notes (Signed)
04/09/2015 4:47 PM   Tanya Henderson March 02, 1976 [id number]  Referring provider: Marisue Ivan, MD [phone] [facility] [address] [facility] [place], [place] [address]  Chief Complaint  Patient presents with  . Other    CT/ renal u/s results    HPI: History of calculi now has a right renal calculus. This calculus is not obstructed but at 6 mm and the patient does not want to obstruct her and wants something done about it. Plan to do a right ESWL. Procedures explained to the patient including be completely asleep may have some discomfort associated with the shockwaves. KUB will be obtained sure we can see the stone easily.     PMH: Past Medical History  Diagnosis Date  . Vitamin D deficiency     Surgical History: Past Surgical History  Procedure Laterality Date  . Cholecystectomy    . Oophorectomy Right 1996    Ovarian cyst  . Cystoscopy with stent placement Right 01/29/2015    Procedure: CYSTOSCOPY WITH STENT PLACEMENT;  Surgeon: Crist Fat, MD;  Location: [facility] ORS;  Service: Urology;  Laterality: Right;  . Ureteroscopy with holmium laser lithotripsy Right 02/10/2015    Procedure: URETEROSCOPY WITH HOLMIUM LASER LITHOTRIPSY;  Surgeon: Lorraine Lax, MD;  Location: [facility] OR[facility];  Service: Urology;  Laterality: Right;  . Cystoscopy w/ ureteral stent removal Right 02/10/2015    Procedure: CYSTOSCOPY WITH STENT REMOVAL;  Surgeon: Lorraine Lax, MD;  Location: [facility];  Service: Urology;  Laterality: Right;  . Cystoscopy with stent placement Right 02/10/2015    Procedure: CYSTOSCOPY WITH STENT PLACEMENT;  Surgeon: Lorraine Lax, MD;  Location: [facility];  Service: Urology;  Laterality: Right;    Home Medications:    Medication List    Notice  As of 04/09/2015  4:47 PM   You have not been prescribed any medications.      Allergies:  Allergies  Allergen Reactions  . Penicillins     Allergy as a child, unsure of reaction    Family History: Family  History  Problem Relation Age of Onset  . Nephrolithiasis Paternal Grandfather     Social History:  reports that she has been smoking.  She has never used smokeless tobacco. She reports that she drinks alcohol. She reports that she does not use illicit drugs.  ROS: UROLOGY Frequent Urination?: No Hard to postpone urination?: No Burning/pain with urination?: No Get up at night to urinate?: No Leakage of urine?: Yes Urine stream starts and stops?: No Trouble starting stream?: No Do you have to strain to urinate?: No Blood in urine?: No Urinary tract infection?: No Sexually transmitted disease?: No Injury to kidneys or bladder?: No Painful intercourse?: No Weak stream?: No Currently pregnant?: No Vaginal bleeding?: No Last menstrual period?: n  Gastrointestinal Nausea?: No Vomiting?: No Indigestion/heartburn?: Yes Diarrhea?: No Constipation?: No  Constitutional Fever: No Night sweats?: No Weight loss?: No Fatigue?: No  Skin Skin rash/lesions?: No Itching?: No  Eyes Blurred vision?: No Double vision?: No  Ears/Nose/Throat Sore throat?: No Sinus problems?: No  Hematologic/Lymphatic Swollen glands?: No Easy bruising?: No  Cardiovascular Leg swelling?: No Chest pain?: No  Respiratory Cough?: No Shortness of breath?: No  Endocrine Excessive thirst?: No  Musculoskeletal Back pain?: No Joint pain?: No  Neurological Headaches?: No Dizziness?: No  Psychologic Depression?: No Anxiety?: No  Physical Exam: BP 125/78 mmHg  Pulse 80  Ht 5\' 4"  (1.626 m)  Wt 242 lb 8 oz (109.997 kg)  BMI 41.60 kg/m2  LMP 04/04/2015  Constitutional:  Alert and oriented, No acute distress. HEENT: [place] AT, moist mucus membranes.  Trachea midline, no masses. Cardiovascular: No clubbing, cyanosis, or edema. Respiratory: Normal respiratory effort, no increased work of breathing. GI: Abdomen is soft, nontender, nondistended, no abdominal masses GU: No CVA tenderness. No  cystocele rectocele enterocele get a Murphy's Lloyd's McBurney Skin: No rashes, bruises or suspicious lesions. Lymph: No cervical or inguinal adenopathy. Neurologic: Grossly intact, no focal deficits, moving all 4 extremities. Psychiatric: Normal mood and affect.  Laboratory Data: Lab Results  Component Value Date   WBC 9.2 02/15/2015   HGB 16.5* 02/15/2015   HCT 48.2* 02/15/2015   MCV 88.1 02/15/2015   PLT 395 02/15/2015    Lab Results  Component Value Date   CREATININE 0.84 02/01/2015    No results found for: PSA  No results found for: TESTOSTERONE  No results found for: HGBA1C  Urinalysis    Component Value Date/Time   COLORURINE YELLOW* 02/15/2015 1306   APPEARANCEUR CLOUDY* 02/15/2015 1306   LABSPEC 1.011 02/15/2015 1306   PHURINE 8.0 02/15/2015 1306   GLUCOSEU Negative 04/09/2015 1532   HGBUR 1+* 02/15/2015 1306   BILIRUBINUR Negative 04/09/2015 1532   BILIRUBINUR NEGATIVE 02/15/2015 1306   KETONESUR NEGATIVE 02/15/2015 1306   PROTEINUR NEGATIVE 02/15/2015 1306   UROBILINOGEN 0.2 07/24/2009 0934   NITRITE Negative 04/09/2015 1532   NITRITE NEGATIVE 02/15/2015 1306   LEUKOCYTESUR Negative 04/09/2015 1532   LEUKOCYTESUR NEGATIVE 02/15/2015 1306    Pertinent Imaging: Renal ultrasound positive for right renal calculus 6 mm  Assessment & Plan:  Patient with previous calculi now has a right renal calculus that was not 0 calculus in her ureteroscopy. Plan to do right ESWL renal.  1. Kidney stones Right renal no obstruction no hydronephrosis 6 mm in size lower pole - Urinalysis, Complete   No Follow-up on file.  Lorraine Lax, MD  [facility] [address] [place], [place] [address] [phone]

## 2015-04-12 ENCOUNTER — Telehealth: Payer: Self-pay | Admitting: Radiology

## 2015-04-12 NOTE — Telephone Encounter (Signed)
Pt notified of surgery time r/s to 11/3 @ 1:30 instead of 2:30. Pt to arrive at Registration in Medical Mall [facility] at 12:15. Pt voices understanding.

## 2015-04-13 ENCOUNTER — Telehealth: Payer: Self-pay

## 2015-04-13 NOTE — Telephone Encounter (Signed)
-----   Message from Harle BattiestShannon A McGowan, PA-C sent at 04/13/2015 10:19 AM EDT ----- Is patient scheduled for ESWL?

## 2015-04-13 NOTE — Telephone Encounter (Signed)
Yes, this coming Thursday.

## 2015-04-22 ENCOUNTER — Encounter
Admission: RE | Disposition: A | Discharge status: Home / Self Care | Payer: Self-pay | Source: Ambulatory Visit | Attending: Urology

## 2015-04-22 ENCOUNTER — Encounter: Payer: Self-pay | Admitting: *Deleted

## 2015-04-22 ENCOUNTER — Ambulatory Visit: Payer: Commercial Indemnity

## 2015-04-22 ENCOUNTER — Ambulatory Visit
Admission: RE | Admit: 2015-04-22 | Discharge: 2015-04-22 | Disposition: A | Discharge status: Home / Self Care | Payer: Commercial Indemnity | Source: Ambulatory Visit | Attending: Urology | Admitting: Urology

## 2015-04-22 DIAGNOSIS — Z720 Tobacco use: Secondary | ICD-10-CM | POA: Insufficient documentation

## 2015-04-22 DIAGNOSIS — K219 Gastro-esophageal reflux disease without esophagitis: Secondary | ICD-10-CM | POA: Insufficient documentation

## 2015-04-22 DIAGNOSIS — N200 Calculus of kidney: Secondary | ICD-10-CM | POA: Diagnosis not present

## 2015-04-22 DIAGNOSIS — Z90721 Acquired absence of ovaries, unilateral: Secondary | ICD-10-CM | POA: Insufficient documentation

## 2015-04-22 DIAGNOSIS — Z791 Long term (current) use of non-steroidal anti-inflammatories (NSAID): Secondary | ICD-10-CM | POA: Insufficient documentation

## 2015-04-22 DIAGNOSIS — F329 Major depressive disorder, single episode, unspecified: Secondary | ICD-10-CM | POA: Insufficient documentation

## 2015-04-22 DIAGNOSIS — Z6841 Body Mass Index (BMI) 40.0 and over, adult: Secondary | ICD-10-CM | POA: Insufficient documentation

## 2015-04-22 DIAGNOSIS — J45909 Unspecified asthma, uncomplicated: Secondary | ICD-10-CM | POA: Insufficient documentation

## 2015-04-22 DIAGNOSIS — Z91018 Allergy to other foods: Secondary | ICD-10-CM | POA: Insufficient documentation

## 2015-04-22 DIAGNOSIS — Z9049 Acquired absence of other specified parts of digestive tract: Secondary | ICD-10-CM | POA: Diagnosis not present

## 2015-04-22 DIAGNOSIS — E669 Obesity, unspecified: Secondary | ICD-10-CM | POA: Diagnosis not present

## 2015-04-22 DIAGNOSIS — Z79899 Other long term (current) drug therapy: Secondary | ICD-10-CM | POA: Diagnosis not present

## 2015-04-22 HISTORY — PX: EXTRACORPOREAL SHOCK WAVE LITHOTRIPSY: SHX1557

## 2015-04-22 LAB — POCT PREGNANCY, URINE: Preg Test, Ur: NEGATIVE

## 2015-04-22 SURGERY — LITHOTRIPSY, ESWL
Anesthesia: Moderate Sedation | Laterality: Right

## 2015-04-22 MED ORDER — CIPROFLOXACIN HCL 500 MG PO TABS
500.0000 mg | ORAL_TABLET | Freq: Once | ORAL | Status: AC
  Administered 2015-04-22: 500 mg via ORAL

## 2015-04-22 MED ORDER — DIPHENHYDRAMINE HCL 25 MG PO CAPS
ORAL_CAPSULE | ORAL | Status: AC
  Filled 2015-04-22: qty 1

## 2015-04-22 MED ORDER — DIPHENHYDRAMINE HCL 25 MG PO CAPS
25.0000 mg | ORAL_CAPSULE | ORAL | Status: AC
  Administered 2015-04-22: 25 mg via ORAL

## 2015-04-22 MED ORDER — CIPROFLOXACIN HCL 500 MG PO TABS
ORAL_TABLET | ORAL | Status: AC
  Filled 2015-04-22: qty 1

## 2015-04-22 MED ORDER — DIAZEPAM 5 MG PO TABS
ORAL_TABLET | ORAL | Status: AC
  Filled 2015-04-22: qty 2

## 2015-04-22 MED ORDER — DEXTROSE-NACL 5-0.45 % IV SOLN: INTRAVENOUS | Status: DC

## 2015-04-22 MED ORDER — DIAZEPAM 5 MG PO TABS
10.0000 mg | ORAL_TABLET | ORAL | Status: AC
  Administered 2015-04-22: 10 mg via ORAL

## 2015-04-22 NOTE — Discharge Instructions (Signed)
Drink lots of fluid. Call Doctor office for scheduling of next procedure

## 2015-04-22 NOTE — Telephone Encounter (Signed)
-----   Message from Hildred LaserBrian James Budzyn, MD sent at 04/22/2015  1:37 PM EDT ----- Ms. Davene CostainBullock needs to f/u with cystoscopy,right retrograde pyelogram, right ureteroscopy, laser lithotripsy, right ureteral stent placement

## 2015-04-22 NOTE — Progress Notes (Signed)
Unable to see stone during lithotripsy, so procedure canceled  Patient needs to be scheduled for cystoscopy, right ureteroscopy, right retrograde pyelogram, laser lithotripsy, right ureteral stent placement

## 2015-04-23 NOTE — Telephone Encounter (Signed)
LMOM to return call regarding surgery scheduled 05/10/15

## 2015-04-29 ENCOUNTER — Other Ambulatory Visit: Payer: Commercial Indemnity

## 2015-04-29 NOTE — Telephone Encounter (Signed)
Pt needs to r/s surgery to December.  Notified pt of surgery now scheduled on 05/26/15, pre-admit testing appt on 11/22 @3 :00 and to call day prior to surgery for arrival time to SDS. Pt voices understanding.

## 2015-05-11 ENCOUNTER — Encounter
Admission: RE | Admit: 2015-05-11 | Discharge: 2015-05-11 | Disposition: A | Discharge status: Home / Self Care | Payer: Managed Care, Other (non HMO) | Source: Ambulatory Visit | Attending: Urology | Admitting: Urology

## 2015-05-11 DIAGNOSIS — Z01812 Encounter for preprocedural laboratory examination: Secondary | ICD-10-CM | POA: Diagnosis not present

## 2015-05-11 HISTORY — DX: Chronic kidney disease, unspecified: N18.9

## 2015-05-11 HISTORY — DX: Headache: R51

## 2015-05-11 HISTORY — DX: Gastro-esophageal reflux disease without esophagitis: K21.9

## 2015-05-11 HISTORY — DX: Headache, unspecified: R51.9

## 2015-05-11 HISTORY — DX: Anxiety disorder, unspecified: F41.9

## 2015-05-11 LAB — DIFFERENTIAL
BASOS ABS: 0.1 10*3/uL (ref 0–0.1)
Basophils Relative: 1 %
EOS ABS: 0.4 10*3/uL (ref 0–0.7)
EOS PCT: 5 %
LYMPHS ABS: 2.4 10*3/uL (ref 1.0–3.6)
Lymphocytes Relative: 27 %
Monocytes Absolute: 0.6 10*3/uL (ref 0.2–0.9)
Monocytes Relative: 7 %
NEUTROS PCT: 60 %
Neutro Abs: 5.4 10*3/uL (ref 1.4–6.5)

## 2015-05-11 LAB — CBC
HCT: 44.1 % (ref 35.0–47.0)
HEMOGLOBIN: 14.7 g/dL (ref 12.0–16.0)
MCH: 29.7 pg (ref 26.0–34.0)
MCHC: 33.3 g/dL (ref 32.0–36.0)
MCV: 89.2 fL (ref 80.0–100.0)
PLATELETS: 338 10*3/uL (ref 150–440)
RBC: 4.94 MIL/uL (ref 3.80–5.20)
RDW: 13.1 % (ref 11.5–14.5)
WBC: 8.8 10*3/uL (ref 3.6–11.0)

## 2015-05-11 LAB — BASIC METABOLIC PANEL
ANION GAP: 7 (ref 5–15)
BUN: 16 mg/dL (ref 6–20)
CALCIUM: 9.4 mg/dL (ref 8.9–10.3)
CO2: 25 mmol/L (ref 22–32)
CREATININE: 0.72 mg/dL (ref 0.44–1.00)
Chloride: 110 mmol/L (ref 101–111)
Glucose, Bld: 99 mg/dL (ref 65–99)
Potassium: 3.8 mmol/L (ref 3.5–5.1)
SODIUM: 142 mmol/L (ref 135–145)

## 2015-05-11 NOTE — Patient Instructions (Signed)
  Your procedure is scheduled on: May 26, 2015 (Wednesday) Report to Day Surgery.(Medical Mall)Second Floor To find out your arrival time please call [phone] between 1PM - 3PM on May 25, 2015(Tuesday).  Remember: Instructions that are not followed completely may result in serious medical risk, up to and including death, or upon the discretion of your surgeon and anesthesiologist your surgery may need to be rescheduled.    __x_ 1. Do not eat food or drink liquids after midnight. No gum chewing or hard candies.     __x__ 2. No Alcohol for 24 hours before or after surgery.   ____ 3. Bring all medications with you on the day of surgery if instructed.    __x__ 4. Notify your doctor if there is any change in your medical condition     (cold, fever, infections).     Do not wear jewelry, make-up, hairpins, clips or nail polish.  Do not wear lotions, powders, or perfumes. You may wear deodorant.  Do not shave 48 hours prior to surgery. Men may shave face and neck.  Do not bring valuables to the hospital.    [facility] not [facility] for any belongings or valuables.               Contacts, dentures or bridgework may not be worn into surgery.  Leave your suitcase in the car. After surgery it may be brought to your room.  For patients admitted to the hospital, discharge time is determined by your                treatment team.   Patients discharged the day of surgery will not be allowed to drive home.   Please read over the following fact sheets that you were given:      ____ Take these medicines the morning of surgery with A SIP OF WATER:    1.   2.   3.   4.  5.  6.  ____ Fleet Enema (as directed)   ____ Use CHG Soap as directed  ____ Use inhalers on the day of surgery  ____ Stop metformin 2 days prior to surgery    ____ Take 1/2 of usual insulin dose the night before surgery and none on the morning of surgery.   ____ Stop Coumadin/Plavix/aspirin on    __x__ Stop Anti-inflammatories on (Tylenol ok to take for pain if needed)   ____ Stop supplements until after surgery.    ____ Bring C-Pap to the hospital.

## 2015-05-13 LAB — URINE CULTURE

## 2015-05-17 ENCOUNTER — Telehealth: Payer: Self-pay | Admitting: Radiology

## 2015-05-17 NOTE — Telephone Encounter (Signed)
Pt notified of contaminated urine culture and need for a cath specimen per Dr Apolinar JunesBrandon. Appt made for 05/18/15 @3 :30. Pt voices understanding.

## 2015-05-18 ENCOUNTER — Ambulatory Visit (INDEPENDENT_AMBULATORY_CARE_PROVIDER_SITE_OTHER): Payer: Managed Care, Other (non HMO)

## 2015-05-18 DIAGNOSIS — N390 Urinary tract infection, site not specified: Secondary | ICD-10-CM | POA: Diagnosis not present

## 2015-05-18 LAB — URINALYSIS, COMPLETE
BILIRUBIN UA: NEGATIVE
GLUCOSE, UA: NEGATIVE
KETONES UA: NEGATIVE
LEUKOCYTES UA: NEGATIVE
Nitrite, UA: NEGATIVE
PROTEIN UA: NEGATIVE
RBC, UA: NEGATIVE
SPEC GRAV UA: 1.02 (ref 1.005–1.030)
Urobilinogen, Ur: 0.2 mg/dL (ref 0.2–1.0)
pH, UA: 7 (ref 5.0–7.5)

## 2015-05-18 LAB — MICROSCOPIC EXAMINATION
Epithelial Cells (non renal): NONE SEEN /hpf (ref 0–10)
RBC, UA: NONE SEEN /hpf (ref 0–?)

## 2015-05-18 NOTE — Progress Notes (Signed)
In and Out Catheterization  Patient is present today for a I & O catheterization due to specimen prior to surgery. Patient was cleaned and prepped in a sterile fashion with betadine and Lidocaine 2% jelly was instilled into the urethra.  A 14FR cath was inserted no complications were noted , 150ml of urine return was noted, urine was cloudy and yellow in color. A clean urine sample was collected for u/a and cx. Bladder was drained  And catheter was removed with out difficulty.    Preformed by: Rupert Stackshelsea Watkins, LPN

## 2015-05-20 LAB — CULTURE, URINE COMPREHENSIVE

## 2015-05-26 ENCOUNTER — Ambulatory Visit: Payer: Managed Care, Other (non HMO) | Admitting: Anesthesiology

## 2015-05-26 ENCOUNTER — Encounter
Admission: RE | Disposition: A | Discharge status: Home / Self Care | Payer: Self-pay | Source: Ambulatory Visit | Attending: Urology

## 2015-05-26 ENCOUNTER — Ambulatory Visit
Admission: RE | Admit: 2015-05-26 | Discharge: 2015-05-26 | Disposition: A | Discharge status: Home / Self Care | Payer: Managed Care, Other (non HMO) | Source: Ambulatory Visit | Attending: Urology | Admitting: Urology

## 2015-05-26 ENCOUNTER — Encounter: Payer: Self-pay | Admitting: *Deleted

## 2015-05-26 DIAGNOSIS — Z90721 Acquired absence of ovaries, unilateral: Secondary | ICD-10-CM | POA: Diagnosis not present

## 2015-05-26 DIAGNOSIS — Z87442 Personal history of urinary calculi: Secondary | ICD-10-CM | POA: Diagnosis not present

## 2015-05-26 DIAGNOSIS — K219 Gastro-esophageal reflux disease without esophagitis: Secondary | ICD-10-CM | POA: Insufficient documentation

## 2015-05-26 DIAGNOSIS — Z9049 Acquired absence of other specified parts of digestive tract: Secondary | ICD-10-CM | POA: Insufficient documentation

## 2015-05-26 DIAGNOSIS — N189 Chronic kidney disease, unspecified: Secondary | ICD-10-CM | POA: Diagnosis not present

## 2015-05-26 DIAGNOSIS — N200 Calculus of kidney: Secondary | ICD-10-CM | POA: Insufficient documentation

## 2015-05-26 DIAGNOSIS — F17210 Nicotine dependence, cigarettes, uncomplicated: Secondary | ICD-10-CM | POA: Diagnosis not present

## 2015-05-26 DIAGNOSIS — Z9889 Other specified postprocedural states: Secondary | ICD-10-CM | POA: Diagnosis not present

## 2015-05-26 DIAGNOSIS — Z880 Allergy status to penicillin: Secondary | ICD-10-CM | POA: Diagnosis not present

## 2015-05-26 HISTORY — PX: CYSTOSCOPY/URETEROSCOPY/HOLMIUM LASER: SHX6545

## 2015-05-26 HISTORY — PX: CYSTOSCOPY/RETROGRADE/URETEROSCOPY/STONE EXTRACTION WITH BASKET: SHX5317

## 2015-05-26 LAB — POCT PREGNANCY, URINE: PREG TEST UR: NEGATIVE

## 2015-05-26 SURGERY — CYSTOSCOPY, WITH CALCULUS REMOVAL USING BASKET
Anesthesia: General | Laterality: Right | Wound class: Clean Contaminated

## 2015-05-26 MED ORDER — LIDOCAINE HCL (CARDIAC) 20 MG/ML IV SOLN
INTRAVENOUS | Status: DC | PRN
  Administered 2015-05-26: 30 mg via INTRAVENOUS

## 2015-05-26 MED ORDER — CIPROFLOXACIN IN D5W 400 MG/200ML IV SOLN: 400.0000 mg | Freq: Once | INTRAVENOUS | Status: DC

## 2015-05-26 MED ORDER — FAMOTIDINE 20 MG PO TABS
ORAL_TABLET | ORAL | Status: AC
  Filled 2015-05-26: qty 1

## 2015-05-26 MED ORDER — FENTANYL CITRATE (PF) 100 MCG/2ML IJ SOLN: 25.0000 ug | INTRAMUSCULAR | Status: DC | PRN

## 2015-05-26 MED ORDER — FENTANYL CITRATE (PF) 100 MCG/2ML IJ SOLN
INTRAMUSCULAR | Status: DC | PRN
  Administered 2015-05-26: 100 ug via INTRAVENOUS

## 2015-05-26 MED ORDER — NEOSTIGMINE METHYLSULFATE 10 MG/10ML IV SOLN
INTRAVENOUS | Status: DC | PRN
  Administered 2015-05-26: 4 mg via INTRAVENOUS

## 2015-05-26 MED ORDER — SUCCINYLCHOLINE CHLORIDE 20 MG/ML IJ SOLN
INTRAMUSCULAR | Status: DC | PRN
  Administered 2015-05-26: 120 mg via INTRAVENOUS

## 2015-05-26 MED ORDER — CIPROFLOXACIN IN D5W 400 MG/200ML IV SOLN
INTRAVENOUS | Status: AC
Start: 2015-05-26 — End: 2015-05-26
  Administered 2015-05-26: 400 mg via INTRAVENOUS
  Filled 2015-05-26: qty 200

## 2015-05-26 MED ORDER — ONDANSETRON HCL 4 MG/2ML IJ SOLN
INTRAMUSCULAR | Status: DC | PRN
  Administered 2015-05-26: 4 mg via INTRAVENOUS

## 2015-05-26 MED ORDER — MIDAZOLAM HCL 2 MG/2ML IJ SOLN
INTRAMUSCULAR | Status: DC | PRN
  Administered 2015-05-26: 2 mg via INTRAVENOUS

## 2015-05-26 MED ORDER — GLYCOPYRROLATE 0.2 MG/ML IJ SOLN
INTRAMUSCULAR | Status: DC | PRN
  Administered 2015-05-26: .6 mg via INTRAVENOUS

## 2015-05-26 MED ORDER — HYDROCODONE-ACETAMINOPHEN 5-325 MG PO TABS: 1.0000 | ORAL_TABLET | Freq: Four times a day (QID) | ORAL | Status: DC | PRN

## 2015-05-26 MED ORDER — FAMOTIDINE 20 MG PO TABS
20.0000 mg | ORAL_TABLET | Freq: Once | ORAL | Status: AC
  Administered 2015-05-26: 20 mg via ORAL

## 2015-05-26 MED ORDER — PROPOFOL 10 MG/ML IV BOLUS
INTRAVENOUS | Status: DC | PRN
  Administered 2015-05-26: 200 mg via INTRAVENOUS

## 2015-05-26 MED ORDER — ROCURONIUM BROMIDE 100 MG/10ML IV SOLN
INTRAVENOUS | Status: DC | PRN
  Administered 2015-05-26: 35 mg via INTRAVENOUS

## 2015-05-26 MED ORDER — DEXAMETHASONE SODIUM PHOSPHATE 4 MG/ML IJ SOLN
INTRAMUSCULAR | Status: DC | PRN
  Administered 2015-05-26: 4 mg via INTRAVENOUS

## 2015-05-26 MED ORDER — PROMETHAZINE HCL 25 MG/ML IJ SOLN
6.2500 mg | INTRAMUSCULAR | Status: DC | PRN
Start: 2015-05-26 — End: 2015-05-26

## 2015-05-26 MED ORDER — LACTATED RINGERS IV SOLN
INTRAVENOUS | Status: DC
  Administered 2015-05-26 (×2): via INTRAVENOUS

## 2015-05-26 SURGICAL SUPPLY — 29 items
ADAPTER SCOPE UROLOK II (MISCELLANEOUS) ×3 IMPLANT
BAG DRAIN CYSTO-URO LG1000N (MISCELLANEOUS) ×3 IMPLANT
BASKET ZERO TIP 1.9FR (BASKET) ×3 IMPLANT
CATH URETL 5X70 OPEN END (CATHETERS) ×3 IMPLANT
CNTNR SPEC 2.5X3XGRAD LEK (MISCELLANEOUS) ×2
CONRAY 43 FOR UROLOGY 50M (MISCELLANEOUS) ×3 IMPLANT
CONT SPEC 4OZ STER OR WHT (MISCELLANEOUS) ×1
CONTAINER SPEC 2.5X3XGRAD LEK (MISCELLANEOUS) ×2 IMPLANT
GLOVE BIO SURGEON STRL SZ 6.5 (GLOVE) ×3 IMPLANT
GLOVE BIO SURGEON STRL SZ7 (GLOVE) ×6 IMPLANT
GOWN STRL REUS W/ TWL LRG LVL3 (GOWN DISPOSABLE) ×4 IMPLANT
GOWN STRL REUS W/TWL LRG LVL3 (GOWN DISPOSABLE) ×2
INTRODUCER DILATOR DOUBLE (INTRODUCER) ×3 IMPLANT
KIT RM TURNOVER CYSTO AR (KITS) ×3 IMPLANT
LASER HOLMIUM SU 200UM (MISCELLANEOUS) ×3 IMPLANT
LASER HOLMIUM SU 940UM (MISCELLANEOUS) ×3 IMPLANT
PACK CYSTO AR (MISCELLANEOUS) ×3 IMPLANT
PREP PVP WINGED SPONGE (MISCELLANEOUS) ×3 IMPLANT
PUMP SINGLE ACTION SAP (PUMP) ×3 IMPLANT
SENSORWIRE 0.038 NOT ANGLED (WIRE) ×6
SET CYSTO W/LG BORE CLAMP LF (SET/KITS/TRAYS/PACK) ×3 IMPLANT
SHEATH URETERAL 12FRX35CM (MISCELLANEOUS) ×3 IMPLANT
SOL .9 NS 3000ML IRR  AL (IV SOLUTION) ×1
SOL .9 NS 3000ML IRR UROMATIC (IV SOLUTION) ×2 IMPLANT
STENT URET 6FRX24 CONTOUR (STENTS) ×3 IMPLANT
STENT URET 6FRX26 CONTOUR (STENTS) ×3 IMPLANT
SURGILUBE 2OZ TUBE FLIPTOP (MISCELLANEOUS) ×3 IMPLANT
WATER STERILE IRR 1000ML POUR (IV SOLUTION) ×3 IMPLANT
WIRE SENSOR 0.038 NOT ANGLED (WIRE) ×4 IMPLANT

## 2015-05-26 NOTE — Transfer of Care (Signed)
Immediate Anesthesia Transfer of Care Note  Patient: Tanya Henderson  Procedure(s) Performed: Procedure(s): CYSTOSCOPY/RETROGRADE/URETEROSCOPY/STONE EXTRACTION WITH BASKET CYSTOSCOPY/URETEROSCOPY/HOLMIUM LASER  Patient Location: PACU  Anesthesia Type:General  Level of Consciousness: awake  Airway & Oxygen Therapy: Patient Spontanous Breathing and Patient connected to face mask oxygen  Post-op Assessment: Report given to RN and Post -op Vital signs reviewed and stable  Post vital signs: Reviewed and stable  Last Vitals:  Filed Vitals:   05/26/15 0907  BP: 134/73  Temp: 36.9 C  Resp: 16    Complications: No apparent anesthesia complications

## 2015-05-26 NOTE — H&P (Addendum)
05/26/2015  10:25 AM   Tanya Henderson 11/29/1975 [id number]  CC: right ureteral stone  HPI:  39 year old patient of Dr. Edwyna ShellHart (now retired) with history of nephrolithiasis with a 6 mm right renal calculus who is originally scheduled for ESWL and possible ureteral fragement, however, stone could not be seen at the time of the procedure. As such, this surgery was canceled and she was rescheduled for ureteroscopy, laser lithotripsy, possible ureteral stent placement to clear her right side.  Stone was visible on most recent KUB at the pubic ramus and stone fragment seen in RUS within the kidney.  She does endorse a day continued intermittent right flank pain.  She does have previous history of ureteroscopy in 01/2015 on the right side performed by Dr. Edwyna ShellHart.   PMH: Past Medical History  Diagnosis Date  . Vitamin D deficiency   . Anxiety   . GERD (gastroesophageal reflux disease)   . Chronic kidney disease     Kidney stones  . Headache     Surgical History: Past Surgical History  Procedure Laterality Date  . Cholecystectomy    . Oophorectomy Right 1996    Ovarian cyst  . Cystoscopy with stent placement Right 01/29/2015    Procedure: CYSTOSCOPY WITH STENT PLACEMENT;  Surgeon: Crist FatBenjamin W Herrick, MD;  Location: [facility];  Service: Urology;  Laterality: Right;  . Ureteroscopy with holmium laser lithotripsy Right 02/10/2015    Procedure: URETEROSCOPY WITH HOLMIUM LASER LITHOTRIPSY;  Surgeon: Lorraine Laxichard D Hart, MD;  Location: [facility];  Service: Urology;  Laterality: Right;  . Cystoscopy w/ ureteral stent removal Right 02/10/2015    Procedure: CYSTOSCOPY WITH STENT REMOVAL;  Surgeon: Lorraine Laxichard D Hart, MD;  Location: [facility];  Service: Urology;  Laterality: Right;  . Cystoscopy with stent placement Right 02/10/2015    Procedure: CYSTOSCOPY WITH STENT PLACEMENT;  Surgeon: Lorraine Laxichard D Hart, MD;  Location: [facility];  Service: Urology;  Laterality: Right;  . Extracorporeal shock wave  lithotripsy Right 04/22/2015    Procedure: EXTRACORPOREAL SHOCK WAVE LITHOTRIPSY (ESWL);  Surgeon: Hildred LaserBrian James Budzyn, MD;  Location: [facility];  Service: Urology;  Laterality: Right;    Home Medications:    Medication List    ASK your doctor about these medications        guaiFENesin 600 MG 12 hr tablet  Commonly known as:  MUCINEX  Take by mouth 2 (two) times daily as needed.     ibuprofen 400 MG tablet  Commonly known as:  ADVIL,MOTRIN  Take 400 mg by mouth every 6 (six) hours as needed.        Allergies:  Allergies  Allergen Reactions  . Penicillins Other (See Comments)    Allergy as a child, unsure of reaction    Family History: Family History  Problem Relation Age of Onset  . Nephrolithiasis Paternal Grandfather     Social History:  reports that she has been smoking Cigarettes.  She has been smoking about 1.00 pack per day. She has never used smokeless tobacco. She reports that she drinks alcohol. She reports that she does not use illicit drugs.  ROS: 12 point ROS negative other than as per HPI  Physical Exam: BP 134/73 mmHg  Temp(Src) 98.4 F (36.9 C) (Oral)  Resp 16  SpO2 100%  LMP 04/27/2015  Constitutional:  Alert and oriented, No acute distress. HEENT: [place] AT, moist mucus membranes.  Trachea midline, no masses. Cardiovascular: No clubbing, cyanosis, or edema. RRR. Respiratory: Normal respiratory effort, no increased work of breathing.  CTAB. GI: Abdomen is soft, nontender, nondistended, no abdominal masses GU: No CVA tenderness. Right flank sight marked. Skin: No rashes, bruises or suspicious lesions. Neurologic: Grossly intact, no focal deficits, moving all 4 extremities. Psychiatric: Normal mood and affect.  Laboratory Data: Lab Results  Component Value Date   WBC 8.8 05/11/2015   HGB 14.7 05/11/2015   HCT 44.1 05/11/2015   MCV 89.2 05/11/2015   PLT 338 05/11/2015    Lab Results  Component Value Date   CREATININE 0.72 05/11/2015     Urinalysis Results for orders placed or performed during the hospital encounter of 05/26/15  Pregnancy, urine POC  Result Value Ref Range   Preg Test, Ur NEGATIVE NEGATIVE     Pertinent Imaging: CLINICAL DATA: Right renal calculi, lithotripsy planned  EXAM: ABDOMEN - 1 VIEW  COMPARISON: 02/01/2015  FINDINGS: Ureteral stent has been removed. A calcific density seen inferior to the pelvic ram is again identified slightly more inferiorly to the right of midline again. Nonobstructive gas pattern.  IMPRESSION: Calcific density again identified suggesting distal ureteral stone.   Electronically Signed  By: Esperanza Heir M.D.  On: 04/22/2015 14:30  Assessment & Plan:    1. Right kidney stone- plan for right ureteroscopy, laser lithotripsy, possible right ureteral stent placement today. Risk and benefits of the procedure explained in detail and she agrees to proceed as planned.  2. Recurrent nephrolithiasis- recommend follow-up 24 hour urine  Vanna Scotland, MD  [facility] [address], [address] [place], [place] [address] [phone]

## 2015-05-26 NOTE — Op Note (Signed)
Date of procedure: 05/26/2015  Preoperative diagnosis:  1. Right kidney stone   Postoperative diagnosis:  1. Right kidney stone   Procedure: 1. Right ureteroscopy 2. Laser lithotripsy 3. Basket extraction of right kidney stone 4. Retrograde pyelogram (right)  Surgeon: Vanna ScotlandAshley Aydenn Gervin, MD  Anesthesia: General  Complications: None  Intraoperative findings: Small approximately 5 mm right lower pole stone identified, removed via basket extraction. Small Randall's plaque ablated using laser. No ureteral stones identified.  EBL: Minimal  Specimens: Stone fragment  Drains: None  Indication: Tanya Henderson is a 39 y.o. patient with bilateral nephrolithiasis status post right ureteroscopy in 01/2015. She has a persistent right kidney stone and right flank pain therefore is elected to have additional treatment. She was initially rebook for ESWL, however, the stone could not be seen.  After reviewing the management options for treatment, he elected to proceed with the above surgical procedure(s). We have discussed the potential benefits and risks of the procedure, side effects of the proposed treatment, the likelihood of the patient achieving the goals of the procedure, and any potential problems that might occur during the procedure or recuperation. Informed consent has been obtained.  Description of procedure:  The patient was taken to the operating room and general anesthesia was induced.  The patient was placed in the dorsal lithotomy position, prepped and draped in the usual sterile fashion, and preoperative antibiotics were administered. A preoperative time-out was performed.   A rigid cystoscope using a 21 French access sheath was advanced per urethra into the bladder.  Attention was turned to the right ureteral orifice which was cannulated using a 5 JamaicaFrench open-ended ureteral catheter and a retrograde pyelogram was performed. A previous KUB, there was suspicious area in the mid  distal ureter for possible retained ureteral fragment although this was not seen today fluoroscopically. Retrograde pyelogram was also normal with a delicate appearing ureter and a normal upper tract collecting system without any filling defects. The sensor wire was then placed up to level of the kidney without difficulty. A flexible dual lumen Wolfe ureteroscope was advanced very easily over the wire up to level of the kidney and the wire was withdrawn.  Formal pyeloscopy was then performed and each and every calyx was directly visualized using the previous retrograde pyelogram as a roadmap to ensure visualization of the entire collecting system. A small partially 5 mm stone was identified and a lower pole fragment. There was a small adjacent Randall's plaque adherent to the mucosal surface of that calyx. A 200  laser fiber was then brought in and the Randall's plaque was ablated. The stone fragment was grasped using a 1.9 [place]French to plus nitinol basket. There were no remaining stone fragments throughout the entire kidney. The scope and stone fragment were then removed visualizing the ureter along the way to ensure that there was no additional lesions or fragments. There is absolutely no ureteral trauma or edema therefore the decision was made not to place a ureteral stent at the end of the case. The bladder was drained. The patient was then repositioned the supine position, reversed from anesthesia, and taken to the PACU in stable condition.  Plan: Patient will follow-up in 4 weeks with a renal ultrasound prior. We will discuss stone prevention techniques of this next appointment and consider 24-hour urine metabolic workup.   Vanna ScotlandAshley Gerard Bonus, M.D.

## 2015-05-26 NOTE — Anesthesia Procedure Notes (Signed)
Procedure Name: Intubation Performed by: Dominga Mcduffie Pre-anesthesia Checklist: Patient identified, Patient being monitored, Timeout performed, Emergency Drugs available and Suction available Patient Re-evaluated:Patient Re-evaluated prior to inductionOxygen Delivery Method: Circle system utilized Preoxygenation: Pre-oxygenation with 100% oxygen Intubation Type: IV induction Ventilation: Mask ventilation without difficulty Laryngoscope Size: Mac and 3 Grade View: Grade I Tube type: Oral Tube size: 7.0 mm Number of attempts: 1 Airway Equipment and Method: Stylet Placement Confirmation: ETT inserted through vocal cords under direct vision,  positive ETCO2 and breath sounds checked- equal and bilateral Secured at: 21 cm Tube secured with: Tape Dental Injury: Teeth and Oropharynx as per pre-operative assessment        

## 2015-05-26 NOTE — Discharge Instructions (Signed)
Ureteroscopy, Care After °Refer to this sheet in the next few weeks. These instructions provide you with information on caring for yourself after your procedure. Your health care provider may also give you more specific instructions. Your treatment has been planned according to current medical practices, but problems sometimes occur. Call your health care provider if you have any problems or questions after your procedure.  °WHAT TO EXPECT AFTER THE PROCEDURE  °After your procedure, it is typical to have the following:  °· A burning sensation when you urinate. °· Blood in your urine. °HOME CARE INSTRUCTIONS  °· Only take medicines as directed by your health care provider. Do not take any over-the-counter pain medication unless your health care provider says it is okay. °· Take a warm bath or hold a warm washcloth over your groin to relieve burning. °· Drink enough fluids to keep your urine clear or pale yellow. °¨ Drink two 8-ounce glasses of water every hour for the first 2 hours after you get home. °¨ Continue to drink water often at home. °· You can eat what you usually do. °· Ask your surgeon when you can do your usual activities. °· If you had a tube placed to keep urine flowing (ureteral stent), ask your health care provider when you need to return to have it removed. °· Keep all follow-up appointments. °SEEK MEDICAL CARE IF:  °· You have chills or fever. °· You have burning pain for longer than 24 hours after the procedure. °· You have blood in your urine for longer than 24 hours after the procedure. °SEEK IMMEDIATE MEDICAL CARE IF:  °· You have large amounts of blood or clots in your urine. °· You have very bad pain. °· You have chest pain or trouble breathing. °  °This information is not intended to replace advice given to you by your health care provider. Make sure you discuss any questions you have with your health care provider. °  °Document Released: 06/10/2013 Document Reviewed: 06/10/2013 °Elsevier  Interactive Patient Education ©2016 [employer] °AMBULATORY SURGERY  °DISCHARGE INSTRUCTIONS ° ° °1) The drugs that you were given will stay in your system until tomorrow so for the next 24 hours you should not: ° °A) Drive an automobile °B) Make any legal decisions °C) Drink any alcoholic beverage ° ° °2) You may resume regular meals tomorrow.  Today it is better to start with liquids and gradually work up to solid foods. ° °You may eat anything you prefer, but it is better to start with liquids, then soup and crackers, and gradually work up to solid foods. ° ° °3) Please notify your doctor immediately if you have any unusual bleeding, trouble breathing, redness and pain at the surgery site, drainage, fever, or pain not relieved by medication. ° ° ° °4) Additional Instructions: ° ° ° ° ° ° ° °Please contact your physician with any problems or Same Day Surgery at [phone], Monday through Friday 6 am to 4 pm, or [place] [facility] [place] [facility] number at [phone]. °

## 2015-05-26 NOTE — Anesthesia Preprocedure Evaluation (Signed)
Anesthesia Evaluation  Patient identified by MRN, date of birth, ID band Patient awake    Reviewed: Allergy & Precautions, H&P , NPO status , Patient's Chart, lab work & pertinent test results, reviewed documented beta blocker date and time   History of Anesthesia Complications Negative for: history of anesthetic complications  Airway Mallampati: IV  TM Distance: >3 FB Neck ROM: full    Dental no notable dental hx. (+) Teeth Intact, Chipped   Pulmonary neg shortness of breath, neg sleep apnea, neg COPD, neg recent URI, Current Smoker,    Pulmonary exam normal breath sounds clear to auscultation       Cardiovascular Exercise Tolerance: Good negative cardio ROS Normal cardiovascular exam Rhythm:regular Rate:Normal     Neuro/Psych PSYCHIATRIC DISORDERS (depression and anxiety) negative neurological ROS     GI/Hepatic Neg liver ROS, GERD  Poorly Controlled,  Endo/Other  neg diabetesMorbid obesity  Renal/GU Renal disease (kidney stones)  negative genitourinary   Musculoskeletal   Abdominal   Peds  Hematology negative hematology ROS (+)   Anesthesia Other Findings Past Medical History:   Vitamin D deficiency                                         Anxiety                                                      GERD (gastroesophageal reflux disease)                       Chronic kidney disease                                         Comment:Kidney stones   Headache                                                     Reproductive/Obstetrics negative OB ROS                             Anesthesia Physical Anesthesia Plan  ASA: III  Anesthesia Plan: General   Post-op Pain Management:    Induction:   Airway Management Planned:   Additional Equipment:   Intra-op Plan:   Post-operative Plan:   Informed Consent: I have reviewed the patients History and Physical, chart, labs and discussed  the procedure including the risks, benefits and alternatives for the proposed anesthesia with the patient or authorized representative who has indicated his/her understanding and acceptance.   Dental Advisory Given  Plan Discussed with: Anesthesiologist, CRNA and Surgeon  Anesthesia Plan Comments:         Anesthesia Quick Evaluation

## 2015-05-27 NOTE — OR Nursing (Signed)
Message left on answering machine 

## 2015-05-28 NOTE — Anesthesia Postprocedure Evaluation (Signed)
Anesthesia Post Note  Patient: Ebony Bullock-Sassone  Procedure(s) Performed: Procedure(s): CYSTOSCOPY/RETROGRADE/URETEROSCOPY/STONE EXTRACTION WITH BASKET CYSTOSCOPY/URETEROSCOPY/HOLMIUM LASER  Patient location during evaluation: PACU Anesthesia Type: General Level of consciousness: awake and alert Pain management: pain level controlled Vital Signs Assessment: post-procedure vital signs reviewed and stable Respiratory status: spontaneous breathing, nonlabored ventilation, respiratory function stable and patient connected to nasal cannula oxygen Cardiovascular status: blood pressure returned to baseline and stable Postop Assessment: no signs of nausea or vomiting Anesthetic complications: no    Last Vitals:  Filed Vitals:   05/26/15 1300 05/26/15 1323  BP: 108/56 124/68  Pulse: 48 46  Temp: 36.7 C   Resp: 14 14    Last Pain:  Filed Vitals:   05/26/15 1325  PainSc: 5                  Lenard SimmerAndrew Delyle Weider

## 2015-05-31 LAB — STONE ANALYSIS
CA OXALATE, DIHYDRATE: 25 %
CA OXALATE, MONOHYDR.: 55 %
Ca phos cry stone ql IR: 20 %
STONE WEIGHT KSTONE: 3.5 mg

## 2015-06-23 ENCOUNTER — Ambulatory Visit: Payer: Managed Care, Other (non HMO) | Admitting: Urology

## 2015-06-28 ENCOUNTER — Ambulatory Visit: Payer: Managed Care, Other (non HMO)

## 2015-07-01 ENCOUNTER — Ambulatory Visit
Admission: RE | Admit: 2015-07-01 | Discharge: 2015-07-01 | Disposition: A | Discharge status: Home / Self Care | Payer: Managed Care, Other (non HMO) | Source: Ambulatory Visit | Attending: Urology | Admitting: Urology

## 2015-07-01 DIAGNOSIS — Z87442 Personal history of urinary calculi: Secondary | ICD-10-CM | POA: Insufficient documentation

## 2015-07-01 DIAGNOSIS — N200 Calculus of kidney: Secondary | ICD-10-CM

## 2015-07-07 ENCOUNTER — Ambulatory Visit: Payer: Managed Care, Other (non HMO) | Admitting: Urology

## 2015-08-18 ENCOUNTER — Ambulatory Visit (INDEPENDENT_AMBULATORY_CARE_PROVIDER_SITE_OTHER): Payer: Managed Care, Other (non HMO) | Admitting: Urology

## 2015-08-18 ENCOUNTER — Encounter: Payer: Self-pay | Admitting: Urology

## 2015-08-18 VITALS — BP 122/89 | HR 77 | Ht 63.0 in | Wt 254.3 lb

## 2015-08-18 DIAGNOSIS — R109 Unspecified abdominal pain: Secondary | ICD-10-CM

## 2015-08-18 DIAGNOSIS — N200 Calculus of kidney: Secondary | ICD-10-CM | POA: Diagnosis not present

## 2015-08-18 NOTE — Progress Notes (Signed)
08/18/2015 5:52 PM   Tanya Henderson [birth year] [id number]  Referring provider: Marisue Ivan, MD [phone] [facility] [address] [facility] [place], [place] [address]  Chief Complaint  Patient presents with  . Follow-up    intermittent Right flank pain x 3-4weeks    HPI: 40 yo F with history of kidney stones status post right ureteroscopy, laser lithotripsy on 05/26/2015. At that time, a small 5 mm right lower pole was identified but otherwise there were no other nonobstructing stones other than some small Randall's plaques which were ablated.  Follow-up renal ultrasound demonstrated no evidence of stones or hydronephrosis bilaterally.  Today, she does complain over the past month, she's had some right flank pain off and on. She is worried that she may be passing another stone.  No urinary symptoms, nausea, vomiting, or gross hematuria.  Prior to last year, she is never previously passed stones.  She does drink coffee or tea.  Rare soda.  She does not drink water like she should.    Stone [employer] with calcium oxalate dihydrate 25%, calcium oxalate monohydrate 55%, calcium phosphate 20% area      PMH: Past Medical History  Diagnosis Date  . Vitamin D deficiency   . Anxiety   . GERD (gastroesophageal reflux disease)   . Chronic kidney disease     Kidney stones  . Headache     Surgical History: Past Surgical History  Procedure Laterality Date  . Cholecystectomy    . Oophorectomy Right 1996    Ovarian cyst  . Cystoscopy with stent placement Right 01/29/2015    Procedure: CYSTOSCOPY WITH STENT PLACEMENT;  Surgeon: Crist Fat, MD;  Location: [facility];  Service: Urology;  Laterality: Right;  . Ureteroscopy with holmium laser lithotripsy Right 02/10/2015    Procedure: URETEROSCOPY WITH HOLMIUM LASER LITHOTRIPSY;  Surgeon: Lorraine Lax, MD;  Location: [facility];  Service: Urology;  Laterality: Right;  . Cystoscopy w/ ureteral stent removal  Right 02/10/2015    Procedure: CYSTOSCOPY WITH STENT REMOVAL;  Surgeon: Lorraine Lax, MD;  Location: [facility];  Service: Urology;  Laterality: Right;  . Cystoscopy with stent placement Right 02/10/2015    Procedure: CYSTOSCOPY WITH STENT PLACEMENT;  Surgeon: Lorraine Lax, MD;  Location: [facility];  Service: Urology;  Laterality: Right;  . Extracorporeal shock wave lithotripsy Right 04/22/2015    Procedure: EXTRACORPOREAL SHOCK WAVE LITHOTRIPSY (ESWL);  Surgeon: Hildred Laser, MD;  Location: [facility];  Service: Urology;  Laterality: Right;  . Cystoscopy/retrograde/ureteroscopy/stone extraction with basket  05/26/2015    Procedure: CYSTOSCOPY/RETROGRADE/URETEROSCOPY/STONE EXTRACTION WITH BASKET;  Surgeon: Vanna Scotland, MD;  Location: [facility];  Service: Urology;;  . Cystoscopy/ureteroscopy/holmium laser  05/26/2015    Procedure: CYSTOSCOPY/URETEROSCOPY/HOLMIUM LASER;  Surgeon: Vanna Scotland, MD;  Location: [facility];  Service: Urology;;    Home Medications:    Medication List       This list is accurate as of: 08/18/15  5:52 PM.  Always use your most recent med list.               guaiFENesin 600 MG 12 hr tablet  Commonly known as:  MUCINEX  Take by mouth 2 (two) times daily as needed. Reported on 08/18/2015     HYDROcodone-acetaminophen 5-325 MG tablet  Commonly known as:  NORCO/VICODIN  Take 1-2 tablets by mouth every 6 (six) hours as needed for moderate pain.     HYDROmorphone 2 MG tablet  Commonly known as:  DILAUDID  Reported on 08/18/2015  ibuprofen 400 MG tablet  Commonly known as:  ADVIL,MOTRIN  Take 400 mg by mouth every 6 (six) hours as needed.        Allergies:  Allergies  Allergen Reactions  . Penicillins Other (See Comments)    Allergy as a child, unsure of reaction    Family History: Family History  Problem Relation Age of Onset  . Nephrolithiasis Paternal Grandfather     Social History:  reports that she has been smoking Cigarettes.  She has been  smoking about 1.00 pack per day. She has never used smokeless tobacco. She reports that she drinks alcohol. She reports that she does not use illicit drugs.  ROS: UROLOGY Frequent Urination?: No Hard to postpone urination?: No Burning/pain with urination?: No Get up at night to urinate?: No Leakage of urine?: Yes Urine stream starts and stops?: No Trouble starting stream?: No Do you have to strain to urinate?: No Blood in urine?: No Urinary tract infection?: No Sexually transmitted disease?: No Injury to kidneys or bladder?: No Painful intercourse?: No Weak stream?: No Currently pregnant?: No Vaginal bleeding?: No Last menstrual period?: n  Gastrointestinal Nausea?: No Vomiting?: No Indigestion/heartburn?: Yes Diarrhea?: No Constipation?: No  Constitutional Fever: No Night sweats?: No Weight loss?: No Fatigue?: No  Skin Skin rash/lesions?: No Itching?: No  Eyes Blurred vision?: No Double vision?: No  Ears/Nose/Throat Sore throat?: No Sinus problems?: No  Hematologic/Lymphatic Swollen glands?: No Easy bruising?: No  Cardiovascular Leg swelling?: No Chest pain?: No  Respiratory Cough?: No Shortness of breath?: No  Endocrine Excessive thirst?: No  Musculoskeletal Back pain?: Yes Joint pain?: No  Neurological Headaches?: No Dizziness?: No  Psychologic Depression?: No Anxiety?: No  Physical Exam: BP 122/89 mmHg  Pulse 77  Ht 5\' 3"  (1.6 m)  Wt 254 lb 4.8 oz (115.35 kg)  BMI 45.06 kg/m2  Constitutional:  Alert and oriented, No acute distress. HEENT: [place] AT, moist mucus membranes.  Trachea midline, no masses. Cardiovascular: No clubbing, cyanosis, or edema. Respiratory: Normal respiratory effort, no increased work of breathing. GI: Abdomen is soft, nontender, nondistended, no abdominal masses.  Obese.   GU: No CVA tenderness.  Skin: No rashes, bruises or suspicious lesions. Neurologic: Grossly intact, no focal deficits, moving all 4  extremities. Psychiatric: Normal mood and affect.  Laboratory Data: Lab Results  Component Value Date   WBC 8.8 05/11/2015   HGB 14.7 05/11/2015   HCT 44.1 05/11/2015   MCV 89.2 05/11/2015   PLT 338 05/11/2015    Lab Results  Component Value Date   CREATININE 0.72 05/11/2015   Urinalysis UA today negative. There was some amorphous sediment but otherwise no bacteria or crystals.   Pertinent Imaging: Study Result     CLINICAL DATA: History of renal calculi and prior lithotripsy.  EXAM: RENAL / URINARY TRACT ULTRASOUND COMPLETE  COMPARISON: Ultrasound 04/08/2015 and CT scan 01/29/2015.  FINDINGS: Right Kidney:  Length: 12.6 cm. Normal renal cortical thickness and echogenicity without focal lesions or hydronephrosis. No definite renal calculi.  Left Kidney:  Length: 12.1 cm. Normal renal cortical thickness and echogenicity without focal lesions or hydronephrosis. No definite renal calculi.  Bladder:  Normal. Ureteral jets not observed.  IMPRESSION: Normal renal ultrasound examination. No definite renal calculi or hydronephrosis.   Electronically Signed  By: Rudie Meyer M.D.  On: 07/01/2015 16:58      Assessment & Plan:    1. Nephrolithiasis History of nephrolithiasis status post right ureteroscopy. Follow-up renal or son is negative. Stone analysis reviewed today  with the patient.  We discussed general stone prevention techniques including drinking plenty water with goal of producing 2.5 L urine daily, increased citric acid intake, avoidance of high oxalate containing foods, and decreased salt intake.  Information about dietary recommendations given today.   We also discussed indications for metabolic workup. Given that she is only had this one stone episode this year, I would defer 24-hour metabolic workup at this time but considered in the future if she develops recurrent episodes. She is agreeable with this plan.  - Urinalysis,  Complete - DG Abd 1 View  2. Right flank pain Intermittent right flank pain. Given no further stones in her right side, I do not suspect a recurrent stone in the setting of negative imaging just a little over a month ago. I have offered for her to go and obtain a KUB if her flank pain continues.   Return if symptoms worsen or fail to improve.  Vanna Scotland, MD  [facility] [address] [place], [place] [address] [phone]

## 2015-08-19 LAB — URINALYSIS, COMPLETE
Bilirubin, UA: NEGATIVE
Glucose, UA: NEGATIVE
Ketones, UA: NEGATIVE
LEUKOCYTES UA: NEGATIVE
NITRITE UA: NEGATIVE
PH UA: 7 (ref 5.0–7.5)
Protein, UA: NEGATIVE
RBC UA: NEGATIVE
Specific Gravity, UA: 1.025 (ref 1.005–1.030)
Urobilinogen, Ur: 1 mg/dL (ref 0.2–1.0)

## 2015-08-19 LAB — MICROSCOPIC EXAMINATION
BACTERIA UA: NONE SEEN
RBC MICROSCOPIC, UA: NONE SEEN /HPF (ref 0–?)
WBC, UA: NONE SEEN /hpf (ref 0–?)

## 2015-09-05 ENCOUNTER — Encounter: Payer: Self-pay | Admitting: Emergency Medicine

## 2015-09-05 ENCOUNTER — Emergency Department
Admission: EM | Admit: 2015-09-05 | Discharge: 2015-09-05 | Disposition: A | Discharge status: Home / Self Care | Payer: Managed Care, Other (non HMO) | Attending: Emergency Medicine | Admitting: Emergency Medicine

## 2015-09-05 ENCOUNTER — Emergency Department: Payer: Managed Care, Other (non HMO)

## 2015-09-05 DIAGNOSIS — R109 Unspecified abdominal pain: Secondary | ICD-10-CM

## 2015-09-05 DIAGNOSIS — F17210 Nicotine dependence, cigarettes, uncomplicated: Secondary | ICD-10-CM | POA: Insufficient documentation

## 2015-09-05 DIAGNOSIS — Z3202 Encounter for pregnancy test, result negative: Secondary | ICD-10-CM | POA: Insufficient documentation

## 2015-09-05 DIAGNOSIS — Z880 Allergy status to penicillin: Secondary | ICD-10-CM | POA: Insufficient documentation

## 2015-09-05 DIAGNOSIS — N201 Calculus of ureter: Secondary | ICD-10-CM

## 2015-09-05 DIAGNOSIS — R10A0 Flank pain, unspecified side: Secondary | ICD-10-CM

## 2015-09-05 HISTORY — DX: Calculus of kidney: N20.0

## 2015-09-05 LAB — CBC
HEMATOCRIT: 40.9 % (ref 35.0–47.0)
HEMOGLOBIN: 13.8 g/dL (ref 12.0–16.0)
MCH: 30 pg (ref 26.0–34.0)
MCHC: 33.8 g/dL (ref 32.0–36.0)
MCV: 88.7 fL (ref 80.0–100.0)
Platelets: 262 10*3/uL (ref 150–440)
RBC: 4.61 MIL/uL (ref 3.80–5.20)
RDW: 13.8 % (ref 11.5–14.5)
WBC: 8.5 10*3/uL (ref 3.6–11.0)

## 2015-09-05 LAB — BASIC METABOLIC PANEL
Anion gap: 4 — ABNORMAL LOW (ref 5–15)
BUN: 12 mg/dL (ref 6–20)
CHLORIDE: 112 mmol/L — AB (ref 101–111)
CO2: 20 mmol/L — ABNORMAL LOW (ref 22–32)
Calcium: 8.1 mg/dL — ABNORMAL LOW (ref 8.9–10.3)
Creatinine, Ser: 0.76 mg/dL (ref 0.44–1.00)
GFR calc non Af Amer: >60 mL/min (ref >60)
Glucose, Bld: 116 mg/dL — ABNORMAL HIGH (ref 65–99)
POTASSIUM: 3.8 mmol/L (ref 3.5–5.1)
SODIUM: 136 mmol/L (ref 135–145)

## 2015-09-05 LAB — URINALYSIS COMPLETE WITH MICROSCOPIC (ARMC ONLY)
Bilirubin Urine: NEGATIVE
Glucose, UA: NEGATIVE mg/dL
KETONES UR: NEGATIVE mg/dL
NITRITE: NEGATIVE
PH: 5 (ref 5.0–8.0)
PROTEIN: NEGATIVE mg/dL
SPECIFIC GRAVITY, URINE: 1.026 (ref 1.005–1.030)

## 2015-09-05 LAB — PREGNANCY, URINE: Preg Test, Ur: NEGATIVE

## 2015-09-05 MED ORDER — SODIUM CHLORIDE 0.9 % IV BOLUS (SEPSIS)
1000.0000 mL | Freq: Once | INTRAVENOUS | Status: AC
Start: 2015-09-05 — End: 2015-09-05
  Administered 2015-09-05: 1000 mL via INTRAVENOUS

## 2015-09-05 MED ORDER — TAMSULOSIN HCL 0.4 MG PO CAPS: 0.4000 mg | ORAL_CAPSULE | Freq: Every day | ORAL | Status: DC

## 2015-09-05 MED ORDER — ONDANSETRON 4 MG PO TBDP: 4.0000 mg | ORAL_TABLET | Freq: Three times a day (TID) | ORAL | Status: DC | PRN

## 2015-09-05 MED ORDER — MORPHINE SULFATE (PF) 4 MG/ML IV SOLN
4.0000 mg | Freq: Once | INTRAVENOUS | Status: AC
  Administered 2015-09-05: 4 mg via INTRAVENOUS
  Filled 2015-09-05: qty 1

## 2015-09-05 MED ORDER — MORPHINE SULFATE (PF) 4 MG/ML IV SOLN: 4.0000 mg | Freq: Once | INTRAVENOUS | Status: DC

## 2015-09-05 MED ORDER — ONDANSETRON HCL 4 MG/2ML IJ SOLN
4.0000 mg | Freq: Once | INTRAMUSCULAR | Status: AC
  Administered 2015-09-05: 4 mg via INTRAVENOUS
  Filled 2015-09-05: qty 2

## 2015-09-05 MED ORDER — OXYCODONE-ACETAMINOPHEN 5-325 MG PO TABS: 1.0000 | ORAL_TABLET | Freq: Four times a day (QID) | ORAL | Status: DC | PRN

## 2015-09-05 MED ORDER — KETOROLAC TROMETHAMINE 30 MG/ML IJ SOLN
30.0000 mg | Freq: Once | INTRAMUSCULAR | Status: AC
  Administered 2015-09-05: 30 mg via INTRAVENOUS
  Filled 2015-09-05: qty 1

## 2015-09-05 NOTE — ED Provider Notes (Signed)
[facility] Emergency Department Provider Note  ____________________________________________  Time seen: Approximately 624 AM  I have reviewed the triage vital signs and the nursing notes.   HISTORY  Chief Complaint Flank Pain    HPI Tanya Henderson is a 40 y.o. female with a history of kidney stones who comes into the hospital today with right-sided flank pain. The patient reports that she thinks is having another kidney stone. She reports that the pain started this morning and woke her up out of sleep. The patient reports that it is right in her back and it feels like her previous stones. The patient thinks she had her last lithotripsy in December she sees Dr. Dareen Piano. The patient reports that she did not take anything for pain at home. The patient rates her pain 8 out of 10 in intensity. She's had nausea with no vomiting. She has not been urinating a whole lot when she urinates. The patient has had similar symptoms in the past. She is in some significant discomfort at this time.    Past Medical History  Diagnosis Date  . Vitamin D deficiency   . Anxiety   . GERD (gastroesophageal reflux disease)   . Chronic kidney disease     Kidney stones  . Headache   . Renal calculi     Patient Active Problem List   Diagnosis Date Noted  . Right ureteral stone 02/03/2015  . Hydronephrosis, right 02/03/2015  . Gross hematuria 02/03/2015  . 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 Biliary calculi 12/16/2013  . Clinical depression 12/16/2013  . Gastro-esophageal reflux disease without esophagitis 12/16/2013  . Headache, migraine 12/16/2013  . Unspecified vitamin D deficiency 01/13/2013  . Hidradenitis 01/13/2013    Past Surgical History  Procedure Laterality Date  . Cholecystectomy    . Oophorectomy Right 1996    Ovarian cyst  . Cystoscopy with stent placement Right 01/29/2015    Procedure: CYSTOSCOPY WITH STENT PLACEMENT;  Surgeon: Crist Fat, MD;  Location: [facility] OR[facility];  Service: Urology;  Laterality: Right;  . Ureteroscopy with holmium laser lithotripsy Right 02/10/2015    Procedure: URETEROSCOPY WITH HOLMIUM LASER LITHOTRIPSY;  Surgeon: Lorraine Lax, MD;  Location: [facility];  Service: Urology;  Laterality: Right;  . Cystoscopy w/ ureteral stent removal Right 02/10/2015    Procedure: CYSTOSCOPY WITH STENT REMOVAL;  Surgeon: Lorraine Lax, MD;  Location: [facility];  Service: Urology;  Laterality: Right;  . Cystoscopy with stent placement Right 02/10/2015    Procedure: CYSTOSCOPY WITH STENT PLACEMENT;  Surgeon: Lorraine Lax, MD;  Location: [facility];  Service: Urology;  Laterality: Right;  . Extracorporeal shock wave lithotripsy Right 04/22/2015    Procedure: EXTRACORPOREAL SHOCK WAVE LITHOTRIPSY (ESWL);  Surgeon: Hildred Laser, MD;  Location: [facility];  Service: Urology;  Laterality: Right;  . Cystoscopy/retrograde/ureteroscopy/stone extraction with basket  05/26/2015    Procedure: CYSTOSCOPY/RETROGRADE/URETEROSCOPY/STONE EXTRACTION WITH BASKET;  Surgeon: Vanna Scotland, MD;  Location: [facility];  Service: Urology;;  . Cystoscopy/ureteroscopy/holmium laser  05/26/2015    Procedure: CYSTOSCOPY/URETEROSCOPY/HOLMIUM LASER;  Surgeon: Vanna Scotland, MD;  Location: [facility];  Service: Urology;;    Current Outpatient Rx  Name  Route  Sig  Dispense  Refill  . guaiFENesin (MUCINEX) 600 MG 12 hr tablet   Oral   Take by mouth 2 (two) times daily as needed. Reported on 08/18/2015         . HYDROcodone-acetaminophen (NORCO/VICODIN) 5-325 MG tablet   Oral   Take 1-2 tablets  by mouth every 6 (six) hours as needed for moderate pain. Patient not taking: Reported on 08/18/2015   10 tablet   0   . HYDROmorphone (DILAUDID) 2 MG tablet      Reported on 08/18/2015         . ibuprofen (ADVIL,MOTRIN) 400 MG tablet   Oral   Take 400 mg by mouth every 6 (six) hours as needed.           Allergies Penicillins  Family History   Problem Relation Age of Onset  . Nephrolithiasis Paternal Grandfather     Social History Social History  Substance Use Topics  . Smoking status: Current Every Day Smoker -- 1.00 packs/day    Types: Cigarettes  . Smokeless tobacco: Never Used  . Alcohol Use: Yes     Comment: rare    Review of Systems Constitutional: No fever/chills Eyes: No visual changes. ENT: No sore throat. Cardiovascular: Denies chest pain. Respiratory: Denies shortness of breath. Gastrointestinal: No abdominal pain.  No nausea, no vomiting.  No diarrhea.  No constipation. Genitourinary: decreased urination Musculoskeletal: right flank pain Skin: Negative for rash. Neurological: Negative for headaches, focal weakness or numbness.  10-point ROS otherwise negative.  ____________________________________________   PHYSICAL EXAM:  VITAL SIGNS: ED Triage Vitals  Enc Vitals Group     BP 09/05/15 0613 147/104 mmHg     Pulse Rate 09/05/15 0613 67     Resp 09/05/15 0613 18     Temp 09/05/15 0613 97.9 F (36.6 C)     Temp Source 09/05/15 0613 Oral     SpO2 09/05/15 0613 100 %     Weight 09/05/15 0613 240 lb (108.863 kg)     Height 09/05/15 0613  (1.6 m)     Head Cir --      Peak Flow --      Pain Score 09/05/15 0614 8     Pain Loc --      Pain Edu? --      Excl. in GC? --     Constitutional: Alert and oriented. Well appearing and in moderate distress. Eyes: Conjunctivae are normal. PERRL. EOMI. Head: Atraumatic. Nose: No congestion/rhinnorhea. Mouth/Throat: Mucous membranes are moist.  Oropharynx non-erythematous. Cardiovascular: Normal rate, regular rhythm. Grossly normal heart sounds.  Good peripheral circulation. Respiratory: Normal respiratory effort.  No retractions. Lungs CTAB. Gastrointestinal: Soft and nontender. No distention. Right CVA tenderness to palpation, positive bowel sounds Musculoskeletal: No lower extremity tenderness nor edema.  Neurologic:  Normal speech and language.   Skin:  Skin is warm, dry and intact.  Psychiatric: Mood and affect are normal.   ____________________________________________   LABS (all labs ordered are listed, but only abnormal results are displayed)  Labs Reviewed  BASIC METABOLIC PANEL - Abnormal; Notable for the following:    Chloride 112 (*)    CO2 20 (*)    Glucose, Bld 116 (*)    Calcium 8.1 (*)    Anion gap 4 (*)    All other components within normal limits  URINALYSIS COMPLETEWITH MICROSCOPIC ([facility] ONLY) - Abnormal; Notable for the following:    Color, Urine YELLOW (*)    APPearance HAZY (*)    Hgb urine dipstick 3+ (*)    Leukocytes, UA TRACE (*)    Bacteria, UA RARE (*)    Squamous Epithelial / LPF 6-30 (*)    All other components within normal limits  CBC  PREGNANCY, URINE   ____________________________________________  EKG  none ____________________________________________  RADIOLOGY  CT renal stone study ____________________________________________   PROCEDURES  Procedure(s) performed: None  Critical Care performed: No  ____________________________________________   INITIAL IMPRESSION / ASSESSMENT AND PLAN / ED COURSE  Pertinent labs & imaging results that were available during my care of the patient were reviewed by me and considered in my medical decision making (see chart for details).  Is a 40 year old female with a history of kidney stones and comes into the hospital today with some right-sided flank pain. I will give the patient some morphine as well as Zofran and a liter of normal saline. I will reassess the patient when she's receive her medications and I have received the results of her blood work and urine. I will then determine if we should do a CT scan on the patient to evaluate for stone.  The patient did receive a second dose of morphine for pain.  The patient's care will be signed out to Dr. Lenard LancePaduchowski to follow up the results of the CT scan.   ____________________________________________   FINAL CLINICAL IMPRESSION(S) / ED DIAGNOSES  Final diagnoses:  Flank pain      Rebecka ApleyAllison P Tarrance Januszewski, MD 09/05/15 [phone]

## 2015-09-05 NOTE — ED Notes (Signed)
Pt states awoke at 0300 with right flank pain, nausea and chills. Pt states history of renal calculi with similar symptoms.

## 2015-09-05 NOTE — ED Notes (Signed)
Discussed discharge instructions, prescriptions, and follow-up care with patient. No questions or concerns at this time. Pt stable at discharge.  

## 2015-09-05 NOTE — ED Notes (Signed)
Pt transported to CT ?

## 2015-09-05 NOTE — ED Provider Notes (Signed)
-----------------------------------------   9:50 AM on 09/05/2015 -----------------------------------------  CT consistent with 7 x 5 mm right proximal ureteral stone. Patient continues with 7/10 pain. I discussed the patient with urology, we will try Toradol, pain is still not controlled urology will come in to place a stent. Patient states she has had a stent before and strongly wishes not to have another one, but the patient's pain is not controlled after Toradol she is agreeable to this. Otherwise the patient will need to follow-up in the office tomorrow to arrange definitive treatment.  Minna AntisKevin Ayvah Caroll, MD 09/05/15 [phone]

## 2015-09-05 NOTE — Discharge Instructions (Signed)
Please call Dr. Apolinar JunesBrandon at the number provided above first thing tomorrow morning to arrange a follow-up appointment as soon as possible. Please take your pain medication as needed, nausea medication as needed. Please take your Flomax every day as prescribed. You may also take ibuprofen/Motrin every 6 hours as written on the box. Return to the emergency department for worsening pain, vomiting unable to keep any medications, fever or painful urination.   Kidney Stones Kidney stones (urolithiasis) are deposits that form inside your kidneys. The intense pain is caused by the stone moving through the urinary tract. When the stone moves, the ureter goes into spasm around the stone. The stone is usually passed in the urine.  CAUSES   A disorder that makes certain neck glands produce too much parathyroid hormone (primary hyperparathyroidism).  A buildup of uric acid crystals, similar to gout in your joints.  Narrowing (stricture) of the ureter.  A kidney obstruction present at birth (congenital obstruction).  Previous surgery on the kidney or ureters.  Numerous kidney infections. SYMPTOMS   Feeling sick to your stomach (nauseous).  Throwing up (vomiting).  Blood in the urine (hematuria).  Pain that usually spreads (radiates) to the groin.  Frequency or urgency of urination. DIAGNOSIS   Taking a history and physical exam.  Blood or urine tests.  CT scan.  Occasionally, an examination of the inside of the urinary bladder (cystoscopy) is performed. TREATMENT   Observation.  Increasing your fluid intake.  Extracorporeal shock wave lithotripsy--This is a noninvasive procedure that uses shock waves to break up kidney stones.  Surgery may be needed if you have severe pain or persistent obstruction. There are various surgical procedures. Most of the procedures are performed with the use of small instruments. Only small incisions are needed to accommodate these instruments, so recovery  time is minimized. The size, location, and chemical composition are all important variables that will determine the proper choice of action for you. Talk to your health care provider to better understand your situation so that you will minimize the risk of injury to yourself and your kidney.  HOME CARE INSTRUCTIONS   Drink enough water and fluids to keep your urine clear or pale yellow. This will help you to pass the stone or stone fragments.  Strain all urine through the provided strainer. Keep all particulate matter and stones for your health care provider to see. The stone causing the pain may be as small as a grain of salt. It is very important to use the strainer each and every time you pass your urine. The collection of your stone will allow your health care provider to analyze it and verify that a stone has actually passed. The stone analysis will often identify what you can do to reduce the incidence of recurrences.  Only take over-the-counter or prescription medicines for pain, discomfort, or fever as directed by your health care provider.  Keep all follow-up visits as told by your health care provider. This is important.  Get follow-up X-rays if required. The absence of pain does not always mean that the stone has passed. It may have only stopped moving. If the urine remains completely obstructed, it can cause loss of kidney function or even complete destruction of the kidney. It is your responsibility to make sure X-rays and follow-ups are completed. Ultrasounds of the kidney can show blockages and the status of the kidney. Ultrasounds are not associated with any radiation and can be performed easily in a matter of minutes.  Make changes to your daily diet as told by your health care provider. You may be told to:  Limit the amount of salt that you eat.  Eat 5 or more servings of fruits and vegetables each day.  Limit the amount of meat, poultry, fish, and eggs that you eat.  Collect  a 24-hour urine sample as told by your health care provider.You may need to collect another urine sample every 6-12 months. SEEK MEDICAL CARE IF:  You experience pain that is progressive and unresponsive to any pain medicine you have been prescribed. SEEK IMMEDIATE MEDICAL CARE IF:   Pain cannot be controlled with the prescribed medicine.  You have a fever or shaking chills.  The severity or intensity of pain increases over 18 hours and is not relieved by pain medicine.  You develop a new onset of abdominal pain.  You feel faint or pass out.  You are unable to urinate.   This information is not intended to replace advice given to you by your health care provider. Make sure you discuss any questions you have with your health care provider.   Document Released: 06/05/2005 Document Revised: 02/24/2015 Document Reviewed: 11/06/2012 Elsevier Interactive Patient Education [employer]

## 2015-09-06 ENCOUNTER — Emergency Department
Admission: EM | Admit: 2015-09-06 | Discharge: 2015-09-06 | Disposition: A | Discharge status: Home / Self Care | Payer: Managed Care, Other (non HMO) | Attending: Emergency Medicine | Admitting: Emergency Medicine

## 2015-09-06 ENCOUNTER — Emergency Department: Payer: Managed Care, Other (non HMO)

## 2015-09-06 ENCOUNTER — Encounter: Payer: Self-pay | Admitting: Emergency Medicine

## 2015-09-06 DIAGNOSIS — R1031 Right lower quadrant pain: Secondary | ICD-10-CM | POA: Diagnosis not present

## 2015-09-06 DIAGNOSIS — Z79899 Other long term (current) drug therapy: Secondary | ICD-10-CM | POA: Insufficient documentation

## 2015-09-06 DIAGNOSIS — Z880 Allergy status to penicillin: Secondary | ICD-10-CM | POA: Diagnosis not present

## 2015-09-06 DIAGNOSIS — N23 Unspecified renal colic: Secondary | ICD-10-CM | POA: Diagnosis not present

## 2015-09-06 DIAGNOSIS — F17210 Nicotine dependence, cigarettes, uncomplicated: Secondary | ICD-10-CM | POA: Diagnosis not present

## 2015-09-06 DIAGNOSIS — N201 Calculus of ureter: Secondary | ICD-10-CM | POA: Diagnosis not present

## 2015-09-06 DIAGNOSIS — R109 Unspecified abdominal pain: Secondary | ICD-10-CM | POA: Diagnosis present

## 2015-09-06 DIAGNOSIS — R112 Nausea with vomiting, unspecified: Secondary | ICD-10-CM | POA: Diagnosis not present

## 2015-09-06 LAB — BASIC METABOLIC PANEL
ANION GAP: 5 (ref 5–15)
BUN: 10 mg/dL (ref 6–20)
CHLORIDE: 107 mmol/L (ref 101–111)
CO2: 21 mmol/L — ABNORMAL LOW (ref 22–32)
Calcium: 8.3 mg/dL — ABNORMAL LOW (ref 8.9–10.3)
Creatinine, Ser: 1.19 mg/dL — ABNORMAL HIGH (ref 0.44–1.00)
GFR calc Af Amer: >60 mL/min (ref >60)
GFR calc non Af Amer: 56 mL/min — ABNORMAL LOW (ref >60)
GLUCOSE: 95 mg/dL (ref 65–99)
POTASSIUM: 3.4 mmol/L — AB (ref 3.5–5.1)
Sodium: 133 mmol/L — ABNORMAL LOW (ref 135–145)

## 2015-09-06 LAB — CBC WITH DIFFERENTIAL/PLATELET
BASOS ABS: 0.1 10*3/uL (ref 0–0.1)
Basophils Relative: 1 %
Eosinophils Absolute: 0.1 10*3/uL (ref 0–0.7)
Eosinophils Relative: 1 %
HCT: 43.6 % (ref 35.0–47.0)
HEMOGLOBIN: 14.5 g/dL (ref 12.0–16.0)
LYMPHS ABS: 1.2 10*3/uL (ref 1.0–3.6)
LYMPHS PCT: 8 %
MCH: 29.7 pg (ref 26.0–34.0)
MCHC: 33.3 g/dL (ref 32.0–36.0)
MCV: 89 fL (ref 80.0–100.0)
Monocytes Absolute: 1.6 10*3/uL — ABNORMAL HIGH (ref 0.2–0.9)
Monocytes Relative: 10 %
NEUTROS ABS: 13.3 10*3/uL — AB (ref 1.4–6.5)
NEUTROS PCT: 82 %
Platelets: 253 10*3/uL (ref 150–440)
RBC: 4.9 MIL/uL (ref 3.80–5.20)
RDW: 13.3 % (ref 11.5–14.5)
WBC: 16.3 10*3/uL — AB (ref 3.6–11.0)

## 2015-09-06 LAB — URINALYSIS COMPLETE WITH MICROSCOPIC (ARMC ONLY)
BILIRUBIN URINE: NEGATIVE
Glucose, UA: NEGATIVE mg/dL
KETONES UR: NEGATIVE mg/dL
Nitrite: NEGATIVE
PH: 7 (ref 5.0–8.0)
PROTEIN: 30 mg/dL — AB
Specific Gravity, Urine: 1.015 (ref 1.005–1.030)

## 2015-09-06 MED ORDER — ONDANSETRON HCL 4 MG/2ML IJ SOLN
4.0000 mg | Freq: Once | INTRAMUSCULAR | Status: AC
  Administered 2015-09-06: 4 mg via INTRAVENOUS

## 2015-09-06 MED ORDER — SODIUM CHLORIDE 0.9 % IV BOLUS (SEPSIS)
500.0000 mL | Freq: Once | INTRAVENOUS | Status: AC
  Administered 2015-09-06: 500 mL via INTRAVENOUS

## 2015-09-06 MED ORDER — KETOROLAC TROMETHAMINE 30 MG/ML IJ SOLN
30.0000 mg | Freq: Once | INTRAMUSCULAR | Status: AC
  Administered 2015-09-06: 30 mg via INTRAVENOUS

## 2015-09-06 MED ORDER — KETOROLAC TROMETHAMINE 30 MG/ML IJ SOLN
INTRAMUSCULAR | Status: AC
  Filled 2015-09-06: qty 1

## 2015-09-06 MED ORDER — ONDANSETRON HCL 4 MG/2ML IJ SOLN
INTRAMUSCULAR | Status: AC
  Filled 2015-09-06: qty 2

## 2015-09-06 MED ORDER — HYDROMORPHONE HCL 1 MG/ML IJ SOLN
1.0000 mg | Freq: Once | INTRAMUSCULAR | Status: AC
  Administered 2015-09-06: 1 mg via INTRAVENOUS

## 2015-09-06 MED ORDER — HYDROMORPHONE HCL 1 MG/ML IJ SOLN
INTRAMUSCULAR | Status: AC
  Filled 2015-09-06: qty 1

## 2015-09-06 MED ORDER — CIPROFLOXACIN HCL 500 MG PO TABS: 500.0000 mg | ORAL_TABLET | Freq: Two times a day (BID) | ORAL | Status: DC

## 2015-09-06 MED ORDER — HYDROMORPHONE HCL 2 MG PO TABS
2.0000 mg | ORAL_TABLET | Freq: Once | ORAL | Status: AC
  Administered 2015-09-06: 2 mg via ORAL

## 2015-09-06 MED ORDER — LEVOFLOXACIN IN D5W 500 MG/100ML IV SOLN
500.0000 mg | Freq: Once | INTRAVENOUS | Status: AC
  Administered 2015-09-06: 500 mg via INTRAVENOUS
  Filled 2015-09-06: qty 100

## 2015-09-06 MED ORDER — SODIUM CHLORIDE 0.9 % IV BOLUS (SEPSIS)
1000.0000 mL | Freq: Once | INTRAVENOUS | Status: AC
  Administered 2015-09-06: 1000 mL via INTRAVENOUS

## 2015-09-06 MED ORDER — HYDROMORPHONE HCL 2 MG PO TABS
ORAL_TABLET | ORAL | Status: AC
  Filled 2015-09-06: qty 1

## 2015-09-06 NOTE — ED Notes (Addendum)
Pt reports she has a "kidney stone" - Pt was dx with kidney stone here in the er yesterday and was told to return if the pain got worse and it has gotten worse today - She states she was told to see the urologist tomorrow and that she would need surgery - c/o right hip/lower back/lower right abd pain

## 2015-09-06 NOTE — Consult Note (Signed)
[facility] [place] [facility]   PATIENT NAME: Tanya Henderson    MR#:  [id number]  DATE OF BIRTH:  08/15/1975  DATE OF ADMISSION:  09/06/2015  PRIMARY CARE PHYSICIAN: Marisue IvanLINTHAVONG, KANHKA, MD   CONSULT REQUESTING/REFERRING PHYSICIAN:  Dr. Huel CoteQuigley  REASON FOR CONSULT: Ureteral stone  CHIEF COMPLAINT:   Chief Complaint  Patient presents with  . Flank Pain    HISTORY OF PRESENT ILLNESS:  Tanya Henderson  is a 40 y.o. female with a known history of Prior ureteral stones presents to the hospital with right sided flank and back pain. Patient was seen in the emergency room yesterday with similar pain and had a CT scan which showed right ureteral 7x5 mm stone. She was discharged home with Aleve and Percocet. Patient has had nausea and poor by mouth intake. Patient has not used her Percocet due to side effects and with worsening pain and return to the emergency room. Here she has no tachycardia or fever or hypotension. White count is mildly elevated at 16,000.  After pain medications patient feels much improved. IV fluids 500 ML ordered by emergency room.  PAST MEDICAL HISTORY:   Past Medical History  Diagnosis Date  . Vitamin D deficiency   . Anxiety   . GERD (gastroesophageal reflux disease)   . Chronic kidney disease     Kidney stones  . Headache   . Renal calculi     PAST SURGICAL HISTOIRY:   Past Surgical History  Procedure Laterality Date  . Cholecystectomy    . Oophorectomy Right 1996    Ovarian cyst  . Cystoscopy with stent placement Right 01/29/2015    Procedure: CYSTOSCOPY WITH STENT PLACEMENT;  Surgeon: Crist FatBenjamin W Herrick, MD;  Location: [facility];  Service: Urology;  Laterality: Right;  . Ureteroscopy with holmium laser lithotripsy Right 02/10/2015    Procedure: URETEROSCOPY WITH HOLMIUM LASER LITHOTRIPSY;  Surgeon: Lorraine Laxichard D Hart, MD;  Location: [facility];  Service: Urology;  Laterality: Right;  . Cystoscopy w/ ureteral  stent removal Right 02/10/2015    Procedure: CYSTOSCOPY WITH STENT REMOVAL;  Surgeon: Lorraine Laxichard D Hart, MD;  Location: [facility];  Service: Urology;  Laterality: Right;  . Cystoscopy with stent placement Right 02/10/2015    Procedure: CYSTOSCOPY WITH STENT PLACEMENT;  Surgeon: Lorraine Laxichard D Hart, MD;  Location: [facility];  Service: Urology;  Laterality: Right;  . Extracorporeal shock wave lithotripsy Right 04/22/2015    Procedure: EXTRACORPOREAL SHOCK WAVE LITHOTRIPSY (ESWL);  Surgeon: Hildred LaserBrian James Budzyn, MD;  Location: [facility];  Service: Urology;  Laterality: Right;  . Cystoscopy/retrograde/ureteroscopy/stone extraction with basket  05/26/2015    Procedure: CYSTOSCOPY/RETROGRADE/URETEROSCOPY/STONE EXTRACTION WITH BASKET;  Surgeon: Vanna ScotlandAshley Brandon, MD;  Location: [facility];  Service: Urology;;  . Cystoscopy/ureteroscopy/holmium laser  05/26/2015    Procedure: CYSTOSCOPY/URETEROSCOPY/HOLMIUM LASER;  Surgeon: Vanna ScotlandAshley Brandon, MD;  Location: [facility];  Service: Urology;;    SOCIAL HISTORY:   Social History  Substance Use Topics  . Smoking status: Current Every Day Smoker -- 1.00 packs/day    Types: Cigarettes  . Smokeless tobacco: Never Used  . Alcohol Use: Yes     Comment: rare    FAMILY HISTORY:   Family History  Problem Relation Age of Onset  . Nephrolithiasis Paternal Grandfather     DRUG ALLERGIES:   Allergies  Allergen Reactions  . Penicillins Other (See Comments)    Reaction:  Unknown     REVIEW OF SYSTEMS:   ROS  CONSTITUTIONAL: No fever. EYES: No blurred or  double vision.  EARS, NOSE, AND THROAT: No tinnitus or ear pain.  RESPIRATORY: No cough, shortness of breath, wheezing or hemoptysis.  CARDIOVASCULAR: No chest pain, orthopnea, edema.  GASTROINTESTINAL: No vomiting, diarrhea.. Has had nausea and right flank pain GENITOURINARY: No dysuria, hematuria.  ENDOCRINE: No polyuria, nocturia,  HEMATOLOGY: No anemia, easy bruising or bleeding SKIN: No rash or  lesion. MUSCULOSKELETAL: No joint pain or arthritis.   NEUROLOGIC: No tingling, numbness, weakness.  PSYCHIATRY: No anxiety or depression.   MEDICATIONS AT HOME:   Prior to Admission medications   Medication Sig Start Date End Date Taking? Authorizing Provider  ibuprofen (ADVIL,MOTRIN) 400 MG tablet Take 800 mg by mouth every 6 (six) hours as needed for headache or mild pain.    Yes Historical Provider, MD  ciprofloxacin (CIPRO) 500 MG tablet Take 1 tablet (500 mg total) by mouth 2 (two) times daily. 09/06/15   Crist Fat, MD  HYDROcodone-acetaminophen (NORCO/VICODIN) 5-325 MG tablet Take 1-2 tablets by mouth every 6 (six) hours as needed for moderate pain. Patient not taking: Reported on 09/06/2015 05/26/15   Vanna Scotland, MD  ondansetron (ZOFRAN ODT) 4 MG disintegrating tablet Take 1 tablet (4 mg total) by mouth every 8 (eight) hours as needed for nausea or vomiting. 09/05/15   Minna Antis, MD  oxyCODONE-acetaminophen (ROXICET) 5-325 MG tablet Take 1 tablet by mouth every 6 (six) hours as needed. Patient taking differently: Take 1 tablet by mouth every 6 (six) hours as needed for severe pain.  09/05/15   Minna Antis, MD  tamsulosin (FLOMAX) 0.4 MG CAPS capsule Take 1 capsule (0.4 mg total) by mouth daily. 09/05/15   Minna Antis, MD      VITAL SIGNS:  Blood pressure 138/87, pulse 80, temperature 98.6 F (37 C), temperature source Oral, resp. rate 15, height 5' 3.5" (1.613 m), weight 108.863 kg (240 lb), last menstrual period 08/13/2015, SpO2 96 %.  PHYSICAL EXAMINATION:  GENERAL:  40 y.o.-year-old patient lying in the bed with no acute distress.  EYES: Pupils equal, round, reactive to light and accommodation. No scleral icterus. Extraocular muscles intact.  HEENT: Head atraumatic, normocephalic. Oropharynx and nasopharynx clear.  NECK:  Supple, no jugular venous distention. No thyroid enlargement, no tenderness.  LUNGS: Normal breath sounds bilaterally, no  wheezing, rales,rhonchi or crepitation. No use of accessory muscles of respiration.  CARDIOVASCULAR: S1, S2 normal. No murmurs, rubs, or gallops.  ABDOMEN: Soft, nondistended. Bowel sounds present. No organomegaly or mass. Right flank and CVA area tenderness EXTREMITIES: No pedal edema, cyanosis, or clubbing.  NEUROLOGIC: Cranial nerves II through XII are intact. Muscle strength 5/5 in all extremities. Sensation intact. Gait not checked.  PSYCHIATRIC: The patient is alert and oriented x 3.  SKIN: No obvious rash, lesion, or ulcer.   LABORATORY PANEL:   CBC  Recent Labs Lab 09/06/15 1631  WBC 16.3*  HGB 14.5  HCT 43.6  PLT 253   ------------------------------------------------------------------------------------------------------------------  Chemistries   Recent Labs Lab 09/06/15 1730  NA 133*  K 3.4*  CL 107  CO2 21*  GLUCOSE 95  BUN 10  CREATININE 1.19*  CALCIUM 8.3*   ------------------------------------------------------------------------------------------------------------------  Cardiac Enzymes No results for input(s): TROPONINI in the last 168 hours. ------------------------------------------------------------------------------------------------------------------  RADIOLOGY:  Ct Renal Stone Study  09/05/2015  CLINICAL DATA:  Right flank pain with nausea and chills EXAM: CT ABDOMEN AND PELVIS WITHOUT CONTRAST TECHNIQUE: Multidetector CT imaging of the abdomen and pelvis was performed following the standard protocol without oral or intravenous contrast material administration.  COMPARISON:  January 29, 2015 FINDINGS: Lower chest: There is posterior bibasilar atelectatic change. Lung bases otherwise are clear. Hepatobiliary: The liver is prominent, measuring 19.0 cm in length. No focal liver lesions are identified on this noncontrast enhanced study. The gallbladder is absent. There is no biliary duct dilatation. Pancreas: No pancreatic mass or inflammatory focus. Spleen:  No splenic lesions are identified. Spleen is upper normal in size with configuration within normal limits. Adrenals/Urinary Tract: No adrenal lesions are evident. No renal masses are evident on either side. There is diffuse right renal edema with mild perinephric fluid on the right. There is moderate hydronephrosis on the right. There is no hydronephrosis on the left. There is a 2 x 1 mm calculus in the lower pole left kidney. There is no ureteral calculus on the left. On the right, there is a 1 mm calculus in the mid to lower pole region anteriorly. There is a 7 x 5 mm calculus in the proximal right ureter immediately distal to the right ureteropelvic junction. No other ureteral calculi are evident. Urinary bladder is midline with wall thickness within normal limits. Stomach/Bowel: There are occasional sigmoid diverticula without diverticulitis. There is no bowel wall or mesenteric thickening. There is no bowel obstruction. No free air or portal venous air. Vascular/Lymphatic: There is no abdominal aortic aneurysm. No vascular lesions appreciable on this noncontrast enhanced study. There is no adenopathy in the abdomen or pelvis. Reproductive: Uterus is anteverted. There is no pelvic mass or pelvic fluid collection. There is postoperative change in the right pelvis consistent with prior right oophorectomy. Other: Appendix appears normal. There is no abscess or ascites in the abdomen or pelvis. Musculoskeletal: There are no blastic or lytic bone lesions. There is no intramuscular or abdominal wall lesion. IMPRESSION: 7 x 5 mm calculus in the proximal right ureter just beyond the right ureteropelvic junction causing moderate hydronephrosis and right renal edema. There is mild perinephric fluid on the right. There are small intrarenal calculi bilaterally. No hydronephrosis on the left. No ureteral calculus on the left. Gallbladder and right ovary absent. Appendix appears normal.  No abscess.  No bowel obstruction.  Liver prominent without focal lesion on this noncontrast enhanced study. Electronically Signed   By: Bretta Bang III M.D.   On: 09/05/2015 08:46    EKG:   Orders placed or performed in visit on 07/20/10  . EKG 12-Lead    IMPRESSION AND PLAN:   * Right ureteral stone 7 x 5 mm without hydronephrosis Discussed with Dr. Marlou Porch of urology and Dr. Huel Cote of emergency room. Ordered 1 L normal saline bolus. She has received IV Levaquin. Dr. Marlou Porch is discharging the patient home with antibiotics to follow-up with him in the office tomorrow. She has been counseled to use Percocet if she has significant pain.  Leukocytosis likely from stress reaction and hemoconcentration. No fever or tachycardia or hypotension. Patient feels improved at this point.    All the records are reviewed and case discussed with Consulting provider Dr. Marlou Porch and Dr. Huel Cote.   TOTAL TIME TAKING CARE OF THIS PATIENT: 40 minutes.    Milagros Loll R M.D on 09/06/2015 at 9:30 PM  Between 7am to 6pm - Pager - [phone]  After 6pm go to [email or web address] - password [email or web address] [facility]  [place] [facility]  Office  [phone]  CC: Primary care Physician: Marisue Ivan, MD     Note: This dictation was prepared with Dragon dictation along with smaller phrase technology. Any transcriptional errors that result  from this process are unintentional.

## 2015-09-06 NOTE — ED Notes (Signed)
Pt seen here yesterday for right side flank pain, dx with kidney stone and returns today for same.

## 2015-09-06 NOTE — ED Provider Notes (Addendum)
Time Seen: Approximately 1535 I have reviewed the triage notes  Chief Complaint: Flank Pain   History of Present Illness: Tanya Henderson is a 40 y.o. female who was seen and evaluated here yesterday and was diagnosed with renal colic. Patient has a results of the CAT scan that shows a 7 x 5 mm right proximal ureteral stone. The patient states that she is returning today due to persistent pain and nausea. She states she has contacted the urologist that she was referred to a bland have follow-up until tomorrow. She denies any fever though she's had some continued nausea. Patient took Aleve earlier this morning for pain but has not had any medications. The patient denies any persistent vomiting. She denies any change in the location and characteristics of the pain and points primarily to the right upper and lower quadrant as the source of her discomfort.   Past Medical History  Diagnosis Date  . Vitamin D deficiency   . Anxiety   . GERD (gastroesophageal reflux disease)   . Chronic kidney disease     Kidney stones  . Headache   . Renal calculi     Patient Active Problem List   Diagnosis Date Noted  . Right ureteral stone 02/03/2015  . Hydronephrosis, right 02/03/2015  . Gross hematuria 02/03/2015  . 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 Biliary calculi 12/16/2013  . Clinical depression 12/16/2013  . Gastro-esophageal reflux disease without esophagitis 12/16/2013  . Headache, migraine 12/16/2013  . Unspecified vitamin D deficiency 01/13/2013  . Hidradenitis 01/13/2013    Past Surgical History  Procedure Laterality Date  . Cholecystectomy    . Oophorectomy Right 1996    Ovarian cyst  . Cystoscopy with stent placement Right 01/29/2015    Procedure: CYSTOSCOPY WITH STENT PLACEMENT;  Surgeon: Crist Fat, MD;  Location: [facility];  Service: Urology;  Laterality: Right;  . Ureteroscopy with holmium laser lithotripsy Right 02/10/2015    Procedure:  URETEROSCOPY WITH HOLMIUM LASER LITHOTRIPSY;  Surgeon: Lorraine Lax, MD;  Location: [facility];  Service: Urology;  Laterality: Right;  . Cystoscopy w/ ureteral stent removal Right 02/10/2015    Procedure: CYSTOSCOPY WITH STENT REMOVAL;  Surgeon: Lorraine Lax, MD;  Location: [facility];  Service: Urology;  Laterality: Right;  . Cystoscopy with stent placement Right 02/10/2015    Procedure: CYSTOSCOPY WITH STENT PLACEMENT;  Surgeon: Lorraine Lax, MD;  Location: [facility];  Service: Urology;  Laterality: Right;  . Extracorporeal shock wave lithotripsy Right 04/22/2015    Procedure: EXTRACORPOREAL SHOCK WAVE LITHOTRIPSY (ESWL);  Surgeon: Hildred Laser, MD;  Location: [facility];  Service: Urology;  Laterality: Right;  . Cystoscopy/retrograde/ureteroscopy/stone extraction with basket  05/26/2015    Procedure: CYSTOSCOPY/RETROGRADE/URETEROSCOPY/STONE EXTRACTION WITH BASKET;  Surgeon: Vanna Scotland, MD;  Location: [facility];  Service: Urology;;  . Cystoscopy/ureteroscopy/holmium laser  05/26/2015    Procedure: CYSTOSCOPY/URETEROSCOPY/HOLMIUM LASER;  Surgeon: Vanna Scotland, MD;  Location: [facility];  Service: Urology;;    Past Surgical History  Procedure Laterality Date  . Cholecystectomy    . Oophorectomy Right 1996    Ovarian cyst  . Cystoscopy with stent placement Right 01/29/2015    Procedure: CYSTOSCOPY WITH STENT PLACEMENT;  Surgeon: Crist Fat, MD;  Location: [facility];  Service: Urology;  Laterality: Right;  . Ureteroscopy with holmium laser lithotripsy Right 02/10/2015    Procedure: URETEROSCOPY WITH HOLMIUM LASER LITHOTRIPSY;  Surgeon: Lorraine Lax, MD;  Location: [facility];  Service: Urology;  Laterality: Right;  .  Cystoscopy w/ ureteral stent removal Right 02/10/2015    Procedure: CYSTOSCOPY WITH STENT REMOVAL;  Surgeon: Lorraine Lax, MD;  Location: [facility];  Service: Urology;  Laterality: Right;  . Cystoscopy with stent placement Right 02/10/2015    Procedure: CYSTOSCOPY WITH  STENT PLACEMENT;  Surgeon: Lorraine Lax, MD;  Location: [facility];  Service: Urology;  Laterality: Right;  . Extracorporeal shock wave lithotripsy Right 04/22/2015    Procedure: EXTRACORPOREAL SHOCK WAVE LITHOTRIPSY (ESWL);  Surgeon: Hildred Laser, MD;  Location: [facility];  Service: Urology;  Laterality: Right;  . Cystoscopy/retrograde/ureteroscopy/stone extraction with basket  05/26/2015    Procedure: CYSTOSCOPY/RETROGRADE/URETEROSCOPY/STONE EXTRACTION WITH BASKET;  Surgeon: Vanna Scotland, MD;  Location: [facility];  Service: Urology;;  . Cystoscopy/ureteroscopy/holmium laser  05/26/2015    Procedure: CYSTOSCOPY/URETEROSCOPY/HOLMIUM LASER;  Surgeon: Vanna Scotland, MD;  Location: [facility];  Service: Urology;;    Current Outpatient Rx  Name  Route  Sig  Dispense  Refill  . guaiFENesin (MUCINEX) 600 MG 12 hr tablet   Oral   Take by mouth 2 (two) times daily as needed. Reported on 08/18/2015         . HYDROcodone-acetaminophen (NORCO/VICODIN) 5-325 MG tablet   Oral   Take 1-2 tablets by mouth every 6 (six) hours as needed for moderate pain. Patient not taking: Reported on 08/18/2015   10 tablet   0   . HYDROmorphone (DILAUDID) 2 MG tablet      Reported on 08/18/2015         . ibuprofen (ADVIL,MOTRIN) 400 MG tablet   Oral   Take 400 mg by mouth every 6 (six) hours as needed.         . ondansetron (ZOFRAN ODT) 4 MG disintegrating tablet   Oral   Take 1 tablet (4 mg total) by mouth every 8 (eight) hours as needed for nausea or vomiting.   20 tablet   0   . oxyCODONE-acetaminophen (ROXICET) 5-325 MG tablet   Oral   Take 1 tablet by mouth every 6 (six) hours as needed.   20 tablet   0   . tamsulosin (FLOMAX) 0.4 MG CAPS capsule   Oral   Take 1 capsule (0.4 mg total) by mouth daily.   30 capsule   0     Allergies:  Penicillins  Family History: Family History  Problem Relation Age of Onset  . Nephrolithiasis Paternal Grandfather     Social History: Social History   Substance Use Topics  . Smoking status: Current Every Day Smoker -- 1.00 packs/day    Types: Cigarettes  . Smokeless tobacco: Never Used  . Alcohol Use: Yes     Comment: rare     Review of Systems:   10 point review of systems was performed and was otherwise negative:  Constitutional: No fever Eyes: No visual disturbances ENT: No sore throat, ear pain Cardiac: No chest pain Respiratory: No shortness of breath, wheezing, or stridor Abdomen: Patient describes pain in the right flank area but otherwise no significant left-sided abdominal or back pain Endocrine: No weight loss, No night sweats Extremities: No peripheral edema, cyanosis Skin: No rashes, easy bruising Neurologic: No focal weakness, trouble with speech or swollowing Urologic: No dysuria, Hematuria, or urinary frequency   Physical Exam:  ED Triage Vitals  Enc Vitals Group     BP 09/06/15 1439 135/81 mmHg     Pulse Rate 09/06/15 1439 83     Resp 09/06/15 1439 18     Temp 09/06/15  1439 98.6 F (37 C)     Temp Source 09/06/15 1439 Oral     SpO2 09/06/15 1439 97 %     Weight 09/06/15 1439 240 lb (108.863 kg)     Height 09/06/15 1439 5' 3.5" (1.613 m)     Head Cir --      Peak Flow --      Pain Score 09/06/15 1439 8     Pain Loc --      Pain Edu? --      Excl. in GC? --     General: Awake , Alert , and Oriented times 3; GCS 15 Head: Normal cephalic , atraumatic Eyes: Pupils equal , round, reactive to light Nose/Throat: No nasal drainage, patent upper airway without erythema or exudate.  Neck: Supple, Full range of motion, No anterior adenopathy or palpable thyroid masses Lungs: Clear to ascultation without wheezes , rhonchi, or rales Heart: Regular rate, regular rhythm without murmurs , gallops , or rubs Abdomen: Pain throughout the right upper and lower quadrant without any rebound, guarding, or rigidity.; bowel sounds positive and symmetric in all 4 quadrants. No organomegaly .        Extremities: 2 plus  symmetric pulses. No edema, clubbing or cyanosis Neurologic: normal ambulation, Motor symmetric without deficits, sensory intact Skin: warm, dry, no rashes   Labs:   All laboratory work was reviewed including any pertinent negatives or positives listed below:  Labs Reviewed  CBC WITH DIFFERENTIAL/PLATELET  BASIC METABOLIC PANEL  URINALYSIS COMPLETEWITH MICROSCOPIC ([facility] ONLY)   review of laboratory work shows a significantly increased white blood cell count, slight increase in her creatinine which may be volume dependent, and some signs of infection in her urine with an increase in white blood cells. Urine culture is pending   ED Course:  The patient received initially Toradol with some relief of her discomfort though it then returned and she was given IV Dilaudid and Zofran. Having relief from her pain though of concern is some increasing creatinine along with a white blood cell count, etc. The patient had a urine culture added and was started on IV Levaquin. I spoke to urology on call is agreed to see and evaluate the patient and recommended hospitalization. I spoke to the hospitalist team and they been in to see and evaluate the patient.  Assessment: Acute renal colic      Plan:  Patient was seen and evaluated by the urologist and also the hospitalist team. Patient seemed to be more comfortable after IV pain medication here in emergency department and plans were arranged for outpatient lithotripsy. It was felt that most of her laboratory abnormalities are likely dehydration and patient was advised to follow up as directed.          Jennye MoccasinBrian S Quigley, MD 09/06/15 1921  Jennye MoccasinBrian S Quigley, MD 09/06/15 2225

## 2015-09-06 NOTE — ED Notes (Signed)
Pt in ultrasound

## 2015-09-06 NOTE — Consult Note (Signed)
I have been asked to see the patient by Dr. Brian Quigley, for evaluation and management of right proximal ureteral stone.  History of present illness: 40-year-old female with a history of right-sided nephrolithiasis who presented to the emergency department today for worsening right-sided flank pain. The patient was seen yesterday in the ER where evaluation was performed including a CT scan showing a 5 mm times 7 mm right proximal ureteral stone.  After adequate pain control she was discharged home on medical expulsion therapy. The patient then repositioned in today with poorly controlled pain. She denies any fevers or chills. She denies any dysuria or gross hematuria. She is not a progressive voiding symptoms. She has been unable to really keep any solid food down. Through the course the day she has not taken any of the medications prescribed to her from the emergency department from fear of the side effects including respiratory depression and death. In the emergency room, the patient's pain was quickly managed with IV pain medication. CBC did show a white count that had risen from 8 to 16. However, the remaining aspects of her labs also reflected dehydration. Her urinalysis demonstrated pyuria without clear evidence of infection.  The patient was given an IV dose of Levaquin and rehydration.  Review of systems: A 12 point comprehensive review of systems was obtained and is negative unless otherwise stated in the history of present illness.  Patient Active Problem List   Diagnosis Date Noted  . Right ureteral stone 02/03/2015  . Hydronephrosis, right 02/03/2015  . Gross hematuria 02/03/2015  . 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 Biliary calculi 12/16/2013  . Clinical depression 12/16/2013  . Gastro-esophageal reflux disease without esophagitis 12/16/2013  . Headache, migraine 12/16/2013  . Unspecified vitamin D deficiency 01/13/2013  . Hidradenitis 01/13/2013    No current  facility-administered medications on file prior to encounter.   Current Outpatient Prescriptions on File Prior to Encounter  Medication Sig Dispense Refill  . ibuprofen (ADVIL,MOTRIN) 400 MG tablet Take 800 mg by mouth every 6 (six) hours as needed for headache or mild pain.     . HYDROcodone-acetaminophen (NORCO/VICODIN) 5-325 MG tablet Take 1-2 tablets by mouth every 6 (six) hours as needed for moderate pain. (Patient not taking: Reported on 09/06/2015) 10 tablet 0  . ondansetron (ZOFRAN ODT) 4 MG disintegrating tablet Take 1 tablet (4 mg total) by mouth every 8 (eight) hours as needed for nausea or vomiting. 20 tablet 0  . oxyCODONE-acetaminophen (ROXICET) 5-325 MG tablet Take 1 tablet by mouth every 6 (six) hours as needed. (Patient taking differently: Take 1 tablet by mouth every 6 (six) hours as needed for severe pain. ) 20 tablet 0  . tamsulosin (FLOMAX) 0.4 MG CAPS capsule Take 1 capsule (0.4 mg total) by mouth daily. 30 capsule 0    Past Medical History  Diagnosis Date  . Vitamin D deficiency   . Anxiety   . GERD (gastroesophageal reflux disease)   . Chronic kidney disease     Kidney stones  . Headache   . Renal calculi     Past Surgical History  Procedure Laterality Date  . Cholecystectomy    . Oophorectomy Right 1996    Ovarian cyst  . Cystoscopy with stent placement Right 01/29/2015    Procedure: CYSTOSCOPY WITH STENT PLACEMENT;  Surgeon: Latica Hohmann Henderson Pascuala Klutts, MD;  Location: [facility];  Service: Urology;  Laterality: Right;  . Ureteroscopy with holmium laser lithotripsy Right 02/10/2015    Procedure: URETEROSCOPY WITH HOLMIUM   LASER LITHOTRIPSY;  Surgeon: Richard D Hart, MD;  Location: [facility];  Service: Urology;  Laterality: Right;  . Cystoscopy Henderson/ ureteral stent removal Right 02/10/2015    Procedure: CYSTOSCOPY WITH STENT REMOVAL;  Surgeon: Richard D Hart, MD;  Location: [facility];  Service: Urology;  Laterality: Right;  . Cystoscopy with stent placement Right 02/10/2015     Procedure: CYSTOSCOPY WITH STENT PLACEMENT;  Surgeon: Richard D Hart, MD;  Location: [facility];  Service: Urology;  Laterality: Right;  . Extracorporeal shock wave lithotripsy Right 04/22/2015    Procedure: EXTRACORPOREAL SHOCK WAVE LITHOTRIPSY (ESWL);  Surgeon: Brian James Budzyn, MD;  Location: [facility];  Service: Urology;  Laterality: Right;  . Cystoscopy/retrograde/ureteroscopy/stone extraction with basket  05/26/2015    Procedure: CYSTOSCOPY/RETROGRADE/URETEROSCOPY/STONE EXTRACTION WITH BASKET;  Surgeon: Ashley Brandon, MD;  Location: [facility];  Service: Urology;;  . Cystoscopy/ureteroscopy/holmium laser  05/26/2015    Procedure: CYSTOSCOPY/URETEROSCOPY/HOLMIUM LASER;  Surgeon: Ashley Brandon, MD;  Location: [facility];  Service: Urology;;    Social History  Substance Use Topics  . Smoking status: Current Every Day Smoker -- 1.00 packs/day    Types: Cigarettes  . Smokeless tobacco: Never Used  . Alcohol Use: Yes     Comment: rare    Family History  Problem Relation Age of Onset  . Nephrolithiasis Paternal Grandfather     PE: Filed Vitals:   09/06/15 1439 09/06/15 1527  BP: 135/81 138/87  Pulse: 83 80  Temp: 98.6 F (37 C)   TempSrc: Oral   Resp: 18 15  Height: 5' 3.5" (1.613 m)   Weight: 108.863 kg (240 lb)   SpO2: 97% 96%   Patient appears to be in no acute distress, morbidly obese  patient is alert and oriented x3 Atraumatic normocephalic head No cervical or supraclavicular lymphadenopathy appreciated No increased work of breathing, no audible wheezes/rhonchi Regular sinus rhythm/rate Abdomen is mildly tender to palpation on the right. She has right-sided CVA tenderness. Lower extremities are symmetric without appreciable edema Grossly neurologically intact No identifiable skin lesions   Recent Labs  09/05/15 0648 09/06/15 1631  WBC 8.5 16.3*  HGB 13.8 14.5  HCT 40.9 43.6    Recent Labs  09/05/15 0648 09/06/15 1730  NA 136 133*  K 3.8 3.4*  CL 112* 107   CO2 20* 21*  GLUCOSE 116* 95  BUN 12 10  CREATININE 0.76 1.19*  CALCIUM 8.1* 8.3*   No results for input(s): LABPT, INR in the last 72 hours. No results for input(s): LABURIN in the last 72 hours. Results for orders placed or performed in visit on 08/18/15  Microscopic Examination     Status: Abnormal   Collection Time: 08/18/15  2:55 PM  Result Value Ref Range Status   WBC, UA None seen 0 -  5 /hpf Final   RBC, UA None seen 0 -  2 /hpf Final   Epithelial Cells (non renal) 0-10 0 - 10 /hpf Final   Crystals Present (A) N/A Final   Crystal Type Amorphous Sediment N/A Final   Bacteria, UA None seen None seen/Few Final    Imaging: I'm independently reviewed the patient's CT scan from 09/05/15 which demonstrates a right proximal ureteral stone with mild hydronephrosis. The Hounsfield units of the stone are roughly 500-600 on slow units. Skin is stone distance is proximal 18 cm.  Imp: The patient has a right 5 x 7 mm proximal ureteral stone. At this point her pain has been brought into reasonable control. Prior to coming to   the emergency room today she had not taken any other narcotic pain medication that she was prescribed. Her white blood cell count is elevated, no other markers would indicate that she has a true infection.  Recommendations: I went over the patient's situation with both her and her aunt. Her clinical appearance and exam are reassuring that the patient does not have an infection but rather is dehydrated. We discussed the treatment options for her which would include going to the operating room urgently tonight for a ureteral stent placement, being admitted for observation and pain control with the option of proceeding tomorrow afternoon to the OR for ureteroscopy and laser fragmentation and basket removal followed by ureteral stent. And third we discussed the option of discharge home tonight with close follow-up to urology tomorrow so that we can complete the paperwork for  shockwave lithotripsy on Thursday morning. I discussed all these options and quite a bit of detail. The patient is very reluctant to undergo ureteroscopy with stent placement based on her prior experience. As such, the patient would like to proceed with shockwave lithotripsy. I described for the patient the procedure in quite in detail. I told the patient that there is a 70-75% chance that the stone would fragment without any additional procedures required. She may also develop renal colic and then ultimately passed some of the fragments. Ideally, she would pass all the stone fragments without any additional symptoms. We also briefly discussed the risks of hematoma and damage to the surrounding structures.  The patient is being discharged home from the emergency room tonight with ciprofloxacin 500 mg daily. I also instructed the patient on the proper use of the Percocet prescription that she was provided last night. She was also given return precautions. The patient will be seen tomorrow in the urology clinic so that she can sign all the required documents for shockwave lithotripsy.   Tanya Henderson   

## 2015-09-06 NOTE — Discharge Instructions (Signed)
Kidney Stones °Kidney stones (urolithiasis) are deposits that form inside your kidneys. The intense pain is caused by the stone moving through the urinary tract. When the stone moves, the ureter goes into spasm around the stone. The stone is usually passed in the urine.  °CAUSES  °· A disorder that makes certain neck glands produce too much parathyroid hormone (primary hyperparathyroidism). °· A buildup of uric acid crystals, similar to gout in your joints. °· Narrowing (stricture) of the ureter. °· A kidney obstruction present at birth (congenital obstruction). °· Previous surgery on the kidney or ureters. °· Numerous kidney infections. °SYMPTOMS  °· Feeling sick to your stomach (nauseous). °· Throwing up (vomiting). °· Blood in the urine (hematuria). °· Pain that usually spreads (radiates) to the groin. °· Frequency or urgency of urination. °DIAGNOSIS  °· Taking a history and physical exam. °· Blood or urine tests. °· CT scan. °· Occasionally, an examination of the inside of the urinary bladder (cystoscopy) is performed. °TREATMENT  °· Observation. °· Increasing your fluid intake. °· Extracorporeal shock wave lithotripsy--This is a noninvasive procedure that uses shock waves to break up kidney stones. °· Surgery may be needed if you have severe pain or persistent obstruction. There are various surgical procedures. Most of the procedures are performed with the use of small instruments. Only small incisions are needed to accommodate these instruments, so recovery time is minimized. °The size, location, and chemical composition are all important variables that will determine the proper choice of action for you. Talk to your health care provider to better understand your situation so that you will minimize the risk of injury to yourself and your kidney.  °HOME CARE INSTRUCTIONS  °· Drink enough water and fluids to keep your urine clear or pale yellow. This will help you to pass the stone or stone fragments. °· Strain  all urine through the provided strainer. Keep all particulate matter and stones for your health care provider to see. The stone causing the pain may be as small as a grain of salt. It is very important to use the strainer each and every time you pass your urine. The collection of your stone will allow your health care provider to analyze it and verify that a stone has actually passed. The stone analysis will often identify what you can do to reduce the incidence of recurrences. °· Only take over-the-counter or prescription medicines for pain, discomfort, or fever as directed by your health care provider. °· Keep all follow-up visits as told by your health care provider. This is important. °· Get follow-up X-rays if required. The absence of pain does not always mean that the stone has passed. It may have only stopped moving. If the urine remains completely obstructed, it can cause loss of kidney function or even complete destruction of the kidney. It is your responsibility to make sure X-rays and follow-ups are completed. Ultrasounds of the kidney can show blockages and the status of the kidney. Ultrasounds are not associated with any radiation and can be performed easily in a matter of minutes. °· Make changes to your daily diet as told by your health care provider. You may be told to: °¨ Limit the amount of salt that you eat. °¨ Eat 5 or more servings of fruits and vegetables each day. °¨ Limit the amount of meat, poultry, fish, and eggs that you eat. °· Collect a 24-hour urine sample as told by your health care provider. You may need to collect another urine sample every 6-12   months. °SEEK MEDICAL CARE IF: °· You experience pain that is progressive and unresponsive to any pain medicine you have been prescribed. °SEEK IMMEDIATE MEDICAL CARE IF:  °· Pain cannot be controlled with the prescribed medicine. °· You have a fever or shaking chills. °· The severity or intensity of pain increases over 18 hours and is not  relieved by pain medicine. °· You develop a new onset of abdominal pain. °· You feel faint or pass out. °· You are unable to urinate. °  °This information is not intended to replace advice given to you by your health care provider. Make sure you discuss any questions you have with your health care provider. °  °Document Released: 06/05/2005 Document Revised: 02/24/2015 Document Reviewed: 11/06/2012 °Elsevier Interactive Patient Education ©2016 [employer] ° °

## 2015-09-07 ENCOUNTER — Ambulatory Visit
Admission: AD | Admit: 2015-09-07 | Discharge: 2015-09-07 | Disposition: A | Discharge status: Home / Self Care | Payer: Managed Care, Other (non HMO) | Source: Ambulatory Visit | Attending: Urology | Admitting: Urology

## 2015-09-07 ENCOUNTER — Ambulatory Visit: Payer: Managed Care, Other (non HMO) | Admitting: Anesthesiology

## 2015-09-07 ENCOUNTER — Encounter
Admission: AD | Disposition: A | Discharge status: Home / Self Care | Payer: Self-pay | Source: Ambulatory Visit | Attending: Urology

## 2015-09-07 ENCOUNTER — Ambulatory Visit: Payer: Managed Care, Other (non HMO) | Admitting: Urology

## 2015-09-07 ENCOUNTER — Encounter: Payer: Self-pay | Admitting: Anesthesiology

## 2015-09-07 DIAGNOSIS — Z9049 Acquired absence of other specified parts of digestive tract: Secondary | ICD-10-CM | POA: Diagnosis not present

## 2015-09-07 DIAGNOSIS — N390 Urinary tract infection, site not specified: Secondary | ICD-10-CM | POA: Diagnosis present

## 2015-09-07 DIAGNOSIS — F17210 Nicotine dependence, cigarettes, uncomplicated: Secondary | ICD-10-CM | POA: Insufficient documentation

## 2015-09-07 DIAGNOSIS — Z841 Family history of disorders of kidney and ureter: Secondary | ICD-10-CM | POA: Insufficient documentation

## 2015-09-07 DIAGNOSIS — Z90721 Acquired absence of ovaries, unilateral: Secondary | ICD-10-CM | POA: Diagnosis not present

## 2015-09-07 DIAGNOSIS — K219 Gastro-esophageal reflux disease without esophagitis: Secondary | ICD-10-CM | POA: Insufficient documentation

## 2015-09-07 DIAGNOSIS — Z9889 Other specified postprocedural states: Secondary | ICD-10-CM | POA: Diagnosis not present

## 2015-09-07 DIAGNOSIS — Z87442 Personal history of urinary calculi: Secondary | ICD-10-CM | POA: Insufficient documentation

## 2015-09-07 DIAGNOSIS — N201 Calculus of ureter: Secondary | ICD-10-CM | POA: Insufficient documentation

## 2015-09-07 HISTORY — PX: CYSTOSCOPY WITH STENT PLACEMENT: SHX5790

## 2015-09-07 LAB — POCT PREGNANCY, URINE: Preg Test, Ur: NEGATIVE

## 2015-09-07 SURGERY — CYSTOSCOPY, WITH STENT INSERTION
Anesthesia: General | Site: Ureter | Laterality: Right | Wound class: Clean Contaminated

## 2015-09-07 MED ORDER — FENTANYL CITRATE (PF) 100 MCG/2ML IJ SOLN
25.0000 ug | INTRAMUSCULAR | Status: DC | PRN
  Administered 2015-09-07 (×4): 25 ug via INTRAVENOUS

## 2015-09-07 MED ORDER — ONDANSETRON HCL 4 MG/2ML IJ SOLN: 4.0000 mg | Freq: Once | INTRAMUSCULAR | Status: DC | PRN

## 2015-09-07 MED ORDER — ONDANSETRON HCL 4 MG/2ML IJ SOLN
INTRAMUSCULAR | Status: DC | PRN
  Administered 2015-09-07: 4 mg via INTRAVENOUS

## 2015-09-07 MED ORDER — KETOROLAC TROMETHAMINE 30 MG/ML IJ SOLN
15.0000 mg | Freq: Once | INTRAMUSCULAR | Status: AC
  Administered 2015-09-07: 15 mg via INTRAVENOUS

## 2015-09-07 MED ORDER — ROCURONIUM BROMIDE 100 MG/10ML IV SOLN
INTRAVENOUS | Status: DC | PRN
Start: 2015-09-07 — End: 2015-09-07
  Administered 2015-09-07: 5 mg via INTRAVENOUS

## 2015-09-07 MED ORDER — FENTANYL CITRATE (PF) 100 MCG/2ML IJ SOLN
INTRAMUSCULAR | Status: DC | PRN
  Administered 2015-09-07: 100 ug via INTRAVENOUS

## 2015-09-07 MED ORDER — CIPROFLOXACIN IN D5W 400 MG/200ML IV SOLN
400.0000 mg | Freq: Once | INTRAVENOUS | Status: AC
  Administered 2015-09-07: 400 mg via INTRAVENOUS

## 2015-09-07 MED ORDER — CIPROFLOXACIN IN D5W 400 MG/200ML IV SOLN
INTRAVENOUS | Status: AC
  Administered 2015-09-07: 400 mg via INTRAVENOUS
  Filled 2015-09-07: qty 200

## 2015-09-07 MED ORDER — FENTANYL CITRATE (PF) 100 MCG/2ML IJ SOLN
25.0000 ug | Freq: Once | INTRAMUSCULAR | Status: AC
  Administered 2015-09-07: 25 ug via INTRAVENOUS

## 2015-09-07 MED ORDER — FENTANYL CITRATE (PF) 100 MCG/2ML IJ SOLN
INTRAMUSCULAR | Status: AC
  Administered 2015-09-07: 25 ug via INTRAVENOUS
  Filled 2015-09-07: qty 2

## 2015-09-07 MED ORDER — LACTATED RINGERS IV SOLN
INTRAVENOUS | Status: DC
  Administered 2015-09-07: 11:00:00 via INTRAVENOUS

## 2015-09-07 MED ORDER — DEXAMETHASONE SODIUM PHOSPHATE 10 MG/ML IJ SOLN
INTRAMUSCULAR | Status: DC | PRN
  Administered 2015-09-07: 5 mg via INTRAVENOUS

## 2015-09-07 MED ORDER — LIDOCAINE HCL (CARDIAC) 20 MG/ML IV SOLN
INTRAVENOUS | Status: DC | PRN
  Administered 2015-09-07: 60 mg via INTRAVENOUS

## 2015-09-07 MED ORDER — BELLADONNA ALKALOIDS-OPIUM 16.2-60 MG RE SUPP
RECTAL | Status: AC
  Filled 2015-09-07: qty 1

## 2015-09-07 MED ORDER — SUCCINYLCHOLINE CHLORIDE 20 MG/ML IJ SOLN
INTRAMUSCULAR | Status: DC | PRN
  Administered 2015-09-07: 120 mg via INTRAVENOUS

## 2015-09-07 MED ORDER — PROPOFOL 10 MG/ML IV BOLUS
INTRAVENOUS | Status: DC | PRN
  Administered 2015-09-07: 170 mg via INTRAVENOUS

## 2015-09-07 MED ORDER — IOTHALAMATE MEGLUMINE 43 % IV SOLN
INTRAVENOUS | Status: DC | PRN
  Administered 2015-09-07: 30 mL

## 2015-09-07 MED ORDER — BELLADONNA ALKALOIDS-OPIUM 16.2-60 MG RE SUPP
RECTAL | Status: DC | PRN
  Administered 2015-09-07: 1 via RECTAL

## 2015-09-07 MED ORDER — MIDAZOLAM HCL 5 MG/5ML IJ SOLN
INTRAMUSCULAR | Status: DC | PRN
  Administered 2015-09-07: 2 mg via INTRAVENOUS

## 2015-09-07 SURGICAL SUPPLY — 19 items
BAG DRAIN CYSTO-URO LG1000N (MISCELLANEOUS) ×3 IMPLANT
CATH URETL 5X70 OPEN END (CATHETERS) ×3 IMPLANT
GLOVE BIO SURGEON STRL SZ7 (GLOVE) ×6 IMPLANT
GLOVE BIO SURGEON STRL SZ7.5 (GLOVE) ×3 IMPLANT
GOWN STRL REUS W/ TWL LRG LVL3 (GOWN DISPOSABLE) ×2 IMPLANT
GOWN STRL REUS W/TWL LRG LVL3 (GOWN DISPOSABLE) ×4
PACK CYSTO AR (MISCELLANEOUS) ×3 IMPLANT
PREP PVP WINGED SPONGE (MISCELLANEOUS) ×3 IMPLANT
SENSORWIRE 0.038 NOT ANGLED (WIRE) ×3
SET CYSTO W/LG BORE CLAMP LF (SET/KITS/TRAYS/PACK) ×3 IMPLANT
SOL .9 NS 3000ML IRR  AL (IV SOLUTION) ×2
SOL .9 NS 3000ML IRR UROMATIC (IV SOLUTION) ×1 IMPLANT
SOL PREP PVP 2OZ (MISCELLANEOUS) ×3
SOLUTION PREP PVP 2OZ (MISCELLANEOUS) ×1 IMPLANT
STENT URET 6FRX24 CONTOUR (STENTS) ×3 IMPLANT
STENT URET 6FRX26 CONTOUR (STENTS) IMPLANT
SURGILUBE 2OZ TUBE FLIPTOP (MISCELLANEOUS) ×3 IMPLANT
WATER STERILE IRR 1000ML POUR (IV SOLUTION) ×3 IMPLANT
WIRE SENSOR 0.038 NOT ANGLED (WIRE) ×1 IMPLANT

## 2015-09-07 NOTE — Transfer of Care (Signed)
Immediate Anesthesia Transfer of Care Note  Patient: Tanya Henderson  Procedure(s) Performed: Procedure(s): CYSTOSCOPY WITH STENT PLACEMENT (Right)  Patient Location: PACU  Anesthesia Type:General  Level of Consciousness: awake and patient cooperative  Airway & Oxygen Therapy: Patient Spontanous Breathing and Patient connected to face mask oxygen  Post-op Assessment: Report given to RN  Post vital signs: Reviewed and stable  Last Vitals:  Filed Vitals:   09/07/15 1049 09/07/15 1218  BP: 162/90 152/82  Pulse: 74 78  Temp: 37.6 C 37.1 C  Resp: 20 16    Complications: No apparent anesthesia complications

## 2015-09-07 NOTE — Op Note (Signed)
Preoperative diagnosis:  1. Right proximal stone 2. UTI   Postoperative diagnosis:  1. same   Procedure:  1. Cystoscopy 2. right ureteral stent placement 3. right retrograde pyelography with interpretation   Surgeon: Crist FatBenjamin W. Clara Smolen, MD  Anesthesia: General  Complications: None  Intraoperative findings: Right distal ureter was normal in caliber, there was a filling defect in the mid-proximal ureter consistent with the patient known stone.  There was proximal hydronephrosis without any other filling defects or abnormalities noted within the calyces.  EBL: Minimal  Drain: 24cm x 6 F double J stent  Specimens: None  Indication: Tanya Henderson is a 40 y.o. patient with right proximal ureteral stone with evidence of infection. After reviewing the management options for treatment, he elected to proceed with the above surgical procedure(s). We have discussed the potential benefits and risks of the procedure, side effects of the proposed treatment, the likelihood of the patient achieving the goals of the procedure, and any potential problems that might occur during the procedure or recuperation. Informed consent has been obtained.  Description of procedure:  The patient was taken to the operating room and general anesthesia was induced.  The patient was placed in the dorsal lithotomy position, prepped and draped in the usual sterile fashion, and preoperative antibiotics were administered. A preoperative time-out was performed.   Cystourethroscopy was performed.  The patient's urethra was examined and was normal.  The bladder was then systematically examined in its entirety. There was no evidence for any bladder tumors, stones, or other mucosal pathology.    Attention then turned to the rightureteral orifice and a ureteral catheter was used to intubate the ureteral orifice.  Omnipaque contrast was injected through the ureteral catheter and a retrograde pyelogram was performed  with findings as dictated above.  A 0.38 sensor guidewire was then advanced up the right ureter into the renal pelvis under fluoroscopic guidance.  The wire was then backloaded through the cystoscope and a ureteral stent was advance over the wire using Seldinger technique.  The stent was positioned appropriately under fluoroscopic and cystoscopic guidance.  The wire was then removed with an adequate stent curl noted in the renal pelvis as well as in the bladder.  The bladder was then emptied and the procedure ended.  The patient appeared to tolerate the procedure well and without complications.  The patient was able to be awakened and transferred to the recovery unit in satisfactory condition.    Crist FatBenjamin W. Jariel Drost, M.D.

## 2015-09-07 NOTE — Anesthesia Postprocedure Evaluation (Signed)
Anesthesia Post Note  Patient: Tanya Henderson  Procedure(s) Performed: Procedure(s) (LRB): CYSTOSCOPY WITH STENT PLACEMENT (Right)  Patient location during evaluation: PACU Anesthesia Type: General Level of consciousness: awake and alert and oriented Pain management: pain level controlled Vital Signs Assessment: post-procedure vital signs reviewed and stable Respiratory status: spontaneous breathing Cardiovascular status: blood pressure returned to baseline Anesthetic complications: no    Last Vitals:  Filed Vitals:   09/07/15 1325 09/07/15 1401  BP: 137/64 159/75  Pulse: 67 69  Temp: 36.9 C   Resp: 16 16    Last Pain:  Filed Vitals:   09/07/15 1402  PainSc: Asleep                 Rashada Klontz

## 2015-09-07 NOTE — H&P (View-Only) (Signed)
I have been asked to see the patient by Dr. Lacretia Nicks, for evaluation and management of right proximal ureteral stone.  History of present illness: 40-year-old female with a history of right-sided nephrolithiasis who presented to the emergency department today for worsening right-sided flank pain. The patient was seen yesterday in the ER where evaluation was performed including a CT scan showing a 5 mm times 7 mm right proximal ureteral stone.  After adequate pain control she was discharged home on medical expulsion therapy. The patient then repositioned in today with poorly controlled pain. She denies any fevers or chills. She denies any dysuria or gross hematuria. She is not a progressive voiding symptoms. She has been unable to really keep any solid food down. Through the course the day she has not taken any of the medications prescribed to her from the emergency department from fear of the side effects including respiratory depression and death. In the emergency room, the patient's pain was quickly managed with IV pain medication. CBC did show a white count that had risen from 8 to 16. However, the remaining aspects of her labs also reflected dehydration. Her urinalysis demonstrated pyuria without clear evidence of infection.  The patient was given an IV dose of Levaquin and rehydration.  Review of systems: A 12 point comprehensive review of systems was obtained and is negative unless otherwise stated in the history of present illness.  Patient Active Problem List   Diagnosis Date Noted  . Right ureteral stone 02/03/2015  . Hydronephrosis, right 02/03/2015  . Gross hematuria 02/03/2015  . 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 Biliary calculi 12/16/2013  . Clinical depression 12/16/2013  . Gastro-esophageal reflux disease without esophagitis 12/16/2013  . Headache, migraine 12/16/2013  . Unspecified vitamin D deficiency 01/13/2013  . Hidradenitis 01/13/2013    No current  facility-administered medications on file prior to encounter.   Current Outpatient Prescriptions on File Prior to Encounter  Medication Sig Dispense Refill  . ibuprofen (ADVIL,MOTRIN) 400 MG tablet Take 800 mg by mouth every 6 (six) hours as needed for headache or mild pain.     Marland Kitchen HYDROcodone-acetaminophen (NORCO/VICODIN) 5-325 MG tablet Take 1-2 tablets by mouth every 6 (six) hours as needed for moderate pain. (Patient not taking: Reported on 09/06/2015) 10 tablet 0  . ondansetron (ZOFRAN ODT) 4 MG disintegrating tablet Take 1 tablet (4 mg total) by mouth every 8 (eight) hours as needed for nausea or vomiting. 20 tablet 0  . oxyCODONE-acetaminophen (ROXICET) 5-325 MG tablet Take 1 tablet by mouth every 6 (six) hours as needed. (Patient taking differently: Take 1 tablet by mouth every 6 (six) hours as needed for severe pain. ) 20 tablet 0  . tamsulosin (FLOMAX) 0.4 MG CAPS capsule Take 1 capsule (0.4 mg total) by mouth daily. 30 capsule 0    Past Medical History  Diagnosis Date  . Vitamin D deficiency   . Anxiety   . GERD (gastroesophageal reflux disease)   . Chronic kidney disease     Kidney stones  . Headache   . Renal calculi     Past Surgical History  Procedure Laterality Date  . Cholecystectomy    . Oophorectomy Right 1996    Ovarian cyst  . Cystoscopy with stent placement Right 01/29/2015    Procedure: CYSTOSCOPY WITH STENT PLACEMENT;  Surgeon: Crist Fat, MD;  Location: [facility];  Service: Urology;  Laterality: Right;  . Ureteroscopy with holmium laser lithotripsy Right 02/10/2015    Procedure: URETEROSCOPY WITH HOLMIUM  LASER LITHOTRIPSY;  Surgeon: Lorraine Lax, MD;  Location: [facility];  Service: Urology;  Laterality: Right;  . Cystoscopy w/ ureteral stent removal Right 02/10/2015    Procedure: CYSTOSCOPY WITH STENT REMOVAL;  Surgeon: Lorraine Lax, MD;  Location: [facility];  Service: Urology;  Laterality: Right;  . Cystoscopy with stent placement Right 02/10/2015     Procedure: CYSTOSCOPY WITH STENT PLACEMENT;  Surgeon: Lorraine Lax, MD;  Location: [facility];  Service: Urology;  Laterality: Right;  . Extracorporeal shock wave lithotripsy Right 04/22/2015    Procedure: EXTRACORPOREAL SHOCK WAVE LITHOTRIPSY (ESWL);  Surgeon: Hildred Laser, MD;  Location: [facility];  Service: Urology;  Laterality: Right;  . Cystoscopy/retrograde/ureteroscopy/stone extraction with basket  05/26/2015    Procedure: CYSTOSCOPY/RETROGRADE/URETEROSCOPY/STONE EXTRACTION WITH BASKET;  Surgeon: Vanna Scotland, MD;  Location: [facility];  Service: Urology;;  . Cystoscopy/ureteroscopy/holmium laser  05/26/2015    Procedure: CYSTOSCOPY/URETEROSCOPY/HOLMIUM LASER;  Surgeon: Vanna Scotland, MD;  Location: [facility];  Service: Urology;;    Social History  Substance Use Topics  . Smoking status: Current Every Day Smoker -- 1.00 packs/day    Types: Cigarettes  . Smokeless tobacco: Never Used  . Alcohol Use: Yes     Comment: rare    Family History  Problem Relation Age of Onset  . Nephrolithiasis Paternal Grandfather     PE: Filed Vitals:   09/06/15 1439 09/06/15 1527  BP: 135/81 138/87  Pulse: 83 80  Temp: 98.6 F (37 C)   TempSrc: Oral   Resp: 18 15  Height: 5' 3.5" (1.613 m)   Weight: 108.863 kg (240 lb)   SpO2: 97% 96%   Patient appears to be in no acute distress, morbidly obese  patient is alert and oriented x3 Atraumatic normocephalic head No cervical or supraclavicular lymphadenopathy appreciated No increased work of breathing, no audible wheezes/rhonchi Regular sinus rhythm/rate Abdomen is mildly tender to palpation on the right. She has right-sided CVA tenderness. Lower extremities are symmetric without appreciable edema Grossly neurologically intact No identifiable skin lesions   Recent Labs  09/05/15 0648 09/06/15 1631  WBC 8.5 16.3*  HGB 13.8 14.5  HCT 40.9 43.6    Recent Labs  09/05/15 0648 09/06/15 1730  NA 136 133*  K 3.8 3.4*  CL 112* 107   CO2 20* 21*  GLUCOSE 116* 95  BUN 12 10  CREATININE 0.76 1.19*  CALCIUM 8.1* 8.3*   No results for input(s): LABPT, INR in the last 72 hours. No results for input(s): LABURIN in the last 72 hours. Results for orders placed or performed in visit on 08/18/15  Microscopic Examination     Status: Abnormal   Collection Time: 08/18/15  2:55 PM  Result Value Ref Range Status   WBC, UA None seen 0 -  5 /hpf Final   RBC, UA None seen 0 -  2 /hpf Final   Epithelial Cells (non renal) 0-10 0 - 10 /hpf Final   Crystals Present (A) N/A Final   Crystal Type Amorphous Sediment N/A Final   Bacteria, UA None seen None seen/Few Final    Imaging: I'm independently reviewed the patient's CT scan from 09/05/15 which demonstrates a right proximal ureteral stone with mild hydronephrosis. The Hounsfield units of the stone are roughly 500-600 on slow units. Skin is stone distance is proximal 18 cm.  Imp: The patient has a right 5 x 7 mm proximal ureteral stone. At this point her pain has been brought into reasonable control. Prior to coming to  the emergency room today she had not taken any other narcotic pain medication that she was prescribed. Her white blood cell count is elevated, no other markers would indicate that she has a true infection.  Recommendations: I went over the patient's situation with both her and her aunt. Her clinical appearance and exam are reassuring that the patient does not have an infection but rather is dehydrated. We discussed the treatment options for her which would include going to the operating room urgently tonight for a ureteral stent placement, being admitted for observation and pain control with the option of proceeding tomorrow afternoon to the OR for ureteroscopy and laser fragmentation and basket removal followed by ureteral stent. And third we discussed the option of discharge home tonight with close follow-up to urology tomorrow so that we can complete the paperwork for  shockwave lithotripsy on Thursday morning. I discussed all these options and quite a bit of detail. The patient is very reluctant to undergo ureteroscopy with stent placement based on her prior experience. As such, the patient would like to proceed with shockwave lithotripsy. I described for the patient the procedure in quite in detail. I told the patient that there is a 70-75% chance that the stone would fragment without any additional procedures required. She may also develop renal colic and then ultimately passed some of the fragments. Ideally, she would pass all the stone fragments without any additional symptoms. We also briefly discussed the risks of hematoma and damage to the surrounding structures.  The patient is being discharged home from the emergency room tonight with ciprofloxacin 500 mg daily. I also instructed the patient on the proper use of the Percocet prescription that she was provided last night. She was also given return precautions. The patient will be seen tomorrow in the urology clinic so that she can sign all the required documents for shockwave lithotripsy.   Berniece SalinesHERRICK, Yishai Rehfeld W

## 2015-09-07 NOTE — Interval H&P Note (Signed)
History and Physical Interval Note: Patient developed worsening symptoms, dysuria and low grade fever.  Decision was made to proceed to the OR for stent placement.  R/B discussed. 09/07/2015 11:33 AM  Tanya Henderson  has presented today for surgery, with the diagnosis of stent  The various methods of treatment have been discussed with the patient and family. After consideration of risks, benefits and other options for treatment, the patient has consented to  Procedure(s): CYSTOSCOPY WITH STENT PLACEMENT (Right) as a surgical intervention .  The patient's history has been reviewed, patient examined, no change in status, stable for surgery.  I have reviewed the patient's chart and labs.  Questions were answered to the patient's satisfaction.     Berniece SalinesHERRICK, Berkley Cronkright W

## 2015-09-07 NOTE — Anesthesia Procedure Notes (Addendum)
Procedure Name: Intubation Date/Time: 09/07/2015 11:46 AM Performed by: Lily KocherPERALTA, Norissa Bartee Pre-anesthesia Checklist: Patient identified, Patient being monitored, Timeout performed, Emergency Drugs available and Suction available Patient Re-evaluated:Patient Re-evaluated prior to inductionOxygen Delivery Method: Circle system utilized Preoxygenation: Pre-oxygenation with 100% oxygen Intubation Type: IV induction Ventilation: Mask ventilation without difficulty Laryngoscope Size: Mac and 3 Grade View: Grade I Tube type: Oral Tube size: 7.0 mm Number of attempts: 1 Airway Equipment and Method: Stylet Placement Confirmation: ETT inserted through vocal cords under direct vision,  positive ETCO2 and breath sounds checked- equal and bilateral Secured at: 21 cm Tube secured with: Tape Dental Injury: Teeth and Oropharynx as per pre-operative assessment

## 2015-09-07 NOTE — Discharge Instructions (Signed)
DISCHARGE INSTRUCTIONS FOR KIDNEY STONE/URETERAL STENT   MEDICATIONS:  1.  Resume all your other meds from home - except do not take any extra narcotic pain meds that you may have at home.  2.  Don't forget to take your ciprofloxacin, the antibiotic, as prescribed.  ACTIVITY:  1. No strenuous activity x 1week  2. No driving while on narcotic pain medications  3. Drink plenty of water  4. Continue to walk at home - you can still get blood clots when you are at home, so keep active, but don't over do it.  5. May return to work/school tomorrow or when you feel ready   BATHING:  1. You can shower and we recommend daily showers    SIGNS/SYMPTOMS TO CALL:  Please call us if you have a fever greater than 101.5, uncontrolled nausea/vomiting, uncontrolled pain, dizziness, unable to urinate, bloody urine, chest pain, shortness of breath, leg swelling, leg pain, redness around wound, drainage from wound, or any other concerns or questions.   You can reach us at [phone].   FOLLOW-UP:  1. We will schedule you for follow-up ureteroscopy in the next 10 days. He will be contacted with the scheduled appointment.  AMBULATORY SURGERY  DISCHARGE INSTRUCTIONS   1) The drugs that you were given will stay in your system until tomorrow so for the next 24 hours you should not:  A) Drive an automobile B) Make any legal decisions C) Drink any alcoholic beverage   2) You may resume regular meals tomorrow.  Today it is better to start with liquids and gradually work up to solid foods.  You may eat anything you prefer, but it is better to start with liquids, then soup and crackers, and gradually work up to solid foods.   3) Please notify your doctor immediately if you have any unusual bleeding, trouble breathing, redness and pain at the surgery site, drainage, fever, or pain not relieved by medication.    4) Additional Instructions:        Please contact your physician with any problems  or Same Day Surgery at [phone], Monday through Friday 6 am to 4 pm, or [place] [facility] Main number at [phone].

## 2015-09-07 NOTE — Anesthesia Preprocedure Evaluation (Signed)
Anesthesia Evaluation  Patient identified by MRN, date of birth, ID band Patient awake    Reviewed: Allergy & Precautions, H&P , NPO status , Patient's Chart, lab work & pertinent test results, reviewed documented beta blocker date and time   History of Anesthesia Complications Negative for: history of anesthetic complications  Airway Mallampati: IV  TM Distance: >3 FB Neck ROM: full    Dental no notable dental hx. (+) Teeth Intact, Chipped   Pulmonary neg shortness of breath, neg sleep apnea, neg COPD, neg recent URI, Current Smoker,    Pulmonary exam normal breath sounds clear to auscultation       Cardiovascular Exercise Tolerance: Good negative cardio ROS Normal cardiovascular exam Rhythm:regular Rate:Normal     Neuro/Psych  Headaches, PSYCHIATRIC DISORDERS (depression and anxiety) Anxiety Depression negative neurological ROS     GI/Hepatic Neg liver ROS, GERD  Medicated and Controlled,  Endo/Other  negative endocrine ROSneg diabetesMorbid obesity  Renal/GU Renal InsufficiencyRenal diseaseRight ureteral stone  negative genitourinary   Musculoskeletal negative musculoskeletal ROS (+)   Abdominal   Peds negative pediatric ROS (+)  Hematology negative hematology ROS (+)   Anesthesia Other Findings Past Medical History:   Vitamin D deficiency                                         Anxiety                                                      GERD (gastroesophageal reflux disease)                       Chronic kidney disease                                         Comment:Kidney stones   Headache                                                     Reproductive/Obstetrics negative OB ROS                             Anesthesia Physical Anesthesia Plan  ASA: II  Anesthesia Plan: General   Post-op Pain Management:    Induction: Intravenous  Airway Management Planned: Oral ETT and  LMA  Additional Equipment:   Intra-op Plan:   Post-operative Plan: Extubation in OR  Informed Consent: I have reviewed the patients History and Physical, chart, labs and discussed the procedure including the risks, benefits and alternatives for the proposed anesthesia with the patient or authorized representative who has indicated his/her understanding and acceptance.   Dental advisory given  Plan Discussed with: CRNA and Surgeon  Anesthesia Plan Comments:         Anesthesia Quick Evaluation

## 2015-09-08 ENCOUNTER — Telehealth: Payer: Self-pay | Admitting: Radiology

## 2015-09-08 ENCOUNTER — Encounter: Payer: Self-pay | Admitting: Urology

## 2015-09-08 LAB — URINE CULTURE: SPECIAL REQUESTS: NORMAL

## 2015-09-08 NOTE — Telephone Encounter (Signed)
Notified pt of surgery scheduled 09/21/15, pre-admit phone interview on 3/24 between 9am-1pm and to call day prior to surgery for arrival time to SDS. Pt voices understanding.

## 2015-09-10 ENCOUNTER — Other Ambulatory Visit: Payer: Managed Care, Other (non HMO)

## 2015-09-10 NOTE — Patient Instructions (Signed)
  Your procedure is scheduled on: 09-21-15 (TUESDAY) Report to [facility] SAME DAY SURGERY 2ND FLOOR To find out your arrival time please call [phone] between 1PM - 3PM on 09-20-15 (MONDAY)  Remember: Instructions that are not followed completely may result in serious medical risk, up to and including death, or upon the discretion of your surgeon and anesthesiologist your surgery may need to be rescheduled.    _X___ 1. Do not eat food or drink liquids after midnight. No gum chewing or hard candies.     _X___ 2. No Alcohol for 24 hours before or after surgery.   ____ 3. Bring all medications with you on the day of surgery if instructed.    _X___ 4. Notify your doctor if there is any change in your medical condition     (cold, fever, infections).     Do not wear jewelry, make-up, hairpins, clips or nail polish.  Do not wear lotions, powders, or perfumes. You may wear deodorant.  Do not shave 48 hours prior to surgery. Men may shave face and neck.  Do not bring valuables to the hospital.    [facility] is [facility] for any belongings or valuables.               Contacts, dentures or bridgework may not be worn into surgery.  Leave your suitcase in the car. After surgery it may be brought to your room.  For patients admitted to the hospital, discharge time is determined by your treatment team.   Patients discharged the day of surgery will not be allowed to drive home.   Please read over the following fact sheets that you were given:     ____ Take these medicines the morning of surgery with A SIP OF WATER:  X  1. TAMSULOSIN (FLOMAX)  2.   3.   4.  5.  6.  ____ Fleet Enema (as directed)   ____ Use CHG Soap as directed  ____ Use inhalers on the day of surgery  ____ Stop metformin 2 days prior to surgery    ____ Take 1/2 of usual insulin dose the night before surgery and none on the morning of surgery.   ____ Stop Coumadin/Plavix/aspirin-N/A  __X__ Stop  Anti-inflammatories-STOP IBUPROFEN 7 DAYS PRIOR TO SURGERY-NO NSAIDS OR ASA PRODUCTS-TYLENOL OK TO TAKE   ____ Stop supplements until after surgery.    ____ Bring C-Pap to the hospital.

## 2015-09-21 ENCOUNTER — Ambulatory Visit: Payer: Managed Care, Other (non HMO) | Admitting: Anesthesiology

## 2015-09-21 ENCOUNTER — Encounter
Admission: RE | Disposition: A | Discharge status: Home / Self Care | Payer: Self-pay | Source: Ambulatory Visit | Attending: Urology

## 2015-09-21 ENCOUNTER — Ambulatory Visit
Admission: RE | Admit: 2015-09-21 | Discharge: 2015-09-21 | Disposition: A | Discharge status: Home / Self Care | Payer: Managed Care, Other (non HMO) | Source: Ambulatory Visit | Attending: Urology | Admitting: Urology

## 2015-09-21 ENCOUNTER — Encounter: Payer: Self-pay | Admitting: *Deleted

## 2015-09-21 DIAGNOSIS — Z87442 Personal history of urinary calculi: Secondary | ICD-10-CM | POA: Insufficient documentation

## 2015-09-21 DIAGNOSIS — Z841 Family history of disorders of kidney and ureter: Secondary | ICD-10-CM | POA: Diagnosis not present

## 2015-09-21 DIAGNOSIS — N132 Hydronephrosis with renal and ureteral calculous obstruction: Secondary | ICD-10-CM | POA: Diagnosis not present

## 2015-09-21 DIAGNOSIS — N189 Chronic kidney disease, unspecified: Secondary | ICD-10-CM | POA: Diagnosis not present

## 2015-09-21 DIAGNOSIS — N202 Calculus of kidney with calculus of ureter: Secondary | ICD-10-CM | POA: Diagnosis not present

## 2015-09-21 DIAGNOSIS — Z9889 Other specified postprocedural states: Secondary | ICD-10-CM | POA: Insufficient documentation

## 2015-09-21 DIAGNOSIS — F329 Major depressive disorder, single episode, unspecified: Secondary | ICD-10-CM | POA: Diagnosis not present

## 2015-09-21 DIAGNOSIS — Z9049 Acquired absence of other specified parts of digestive tract: Secondary | ICD-10-CM | POA: Insufficient documentation

## 2015-09-21 DIAGNOSIS — G43909 Migraine, unspecified, not intractable, without status migrainosus: Secondary | ICD-10-CM | POA: Diagnosis not present

## 2015-09-21 DIAGNOSIS — K219 Gastro-esophageal reflux disease without esophagitis: Secondary | ICD-10-CM | POA: Insufficient documentation

## 2015-09-21 DIAGNOSIS — R310 Gross hematuria: Secondary | ICD-10-CM | POA: Diagnosis not present

## 2015-09-21 DIAGNOSIS — N201 Calculus of ureter: Secondary | ICD-10-CM | POA: Diagnosis present

## 2015-09-21 DIAGNOSIS — F17210 Nicotine dependence, cigarettes, uncomplicated: Secondary | ICD-10-CM | POA: Insufficient documentation

## 2015-09-21 DIAGNOSIS — Z6841 Body Mass Index (BMI) 40.0 and over, adult: Secondary | ICD-10-CM | POA: Insufficient documentation

## 2015-09-21 HISTORY — PX: URETEROSCOPY WITH HOLMIUM LASER LITHOTRIPSY: SHX6645

## 2015-09-21 HISTORY — PX: CYSTOSCOPY W/ URETERAL STENT PLACEMENT: SHX1429

## 2015-09-21 LAB — POCT PREGNANCY, URINE: PREG TEST UR: NEGATIVE

## 2015-09-21 SURGERY — URETEROSCOPY, WITH LITHOTRIPSY USING HOLMIUM LASER
Anesthesia: General | Laterality: Right

## 2015-09-21 MED ORDER — CIPROFLOXACIN IN D5W 400 MG/200ML IV SOLN
400.0000 mg | Freq: Once | INTRAVENOUS | Status: AC
  Administered 2015-09-21: 400 mg via INTRAVENOUS

## 2015-09-21 MED ORDER — ROCURONIUM BROMIDE 100 MG/10ML IV SOLN
INTRAVENOUS | Status: DC | PRN
  Administered 2015-09-21: 10 mg via INTRAVENOUS
  Administered 2015-09-21: 20 mg via INTRAVENOUS
  Administered 2015-09-21: 10 mg via INTRAVENOUS

## 2015-09-21 MED ORDER — SUCCINYLCHOLINE CHLORIDE 20 MG/ML IJ SOLN
INTRAMUSCULAR | Status: DC | PRN
  Administered 2015-09-21: 200 mg via INTRAVENOUS

## 2015-09-21 MED ORDER — LACTATED RINGERS IV SOLN
INTRAVENOUS | Status: DC
  Administered 2015-09-21: 50 mL/h via INTRAVENOUS

## 2015-09-21 MED ORDER — SUGAMMADEX SODIUM 500 MG/5ML IV SOLN
INTRAVENOUS | Status: DC | PRN
  Administered 2015-09-21: 225 mg via INTRAVENOUS

## 2015-09-21 MED ORDER — FENTANYL CITRATE (PF) 100 MCG/2ML IJ SOLN
INTRAMUSCULAR | Status: DC | PRN
  Administered 2015-09-21 (×2): 50 ug via INTRAVENOUS

## 2015-09-21 MED ORDER — DEXAMETHASONE SODIUM PHOSPHATE 10 MG/ML IJ SOLN
INTRAMUSCULAR | Status: DC | PRN
  Administered 2015-09-21: 5 mg via INTRAVENOUS

## 2015-09-21 MED ORDER — ONDANSETRON HCL 4 MG/2ML IJ SOLN
INTRAMUSCULAR | Status: DC | PRN
  Administered 2015-09-21: 4 mg via INTRAVENOUS

## 2015-09-21 MED ORDER — GENTAMICIN SULFATE 40 MG/ML IJ SOLN
240.0000 mg | Freq: Once | INTRAVENOUS | Status: AC
  Administered 2015-09-21: 240 mg via INTRAVENOUS
  Filled 2015-09-21: qty 6

## 2015-09-21 MED ORDER — CIPROFLOXACIN IN D5W 400 MG/200ML IV SOLN
INTRAVENOUS | Status: AC
  Administered 2015-09-21: 400 mg via INTRAVENOUS
  Filled 2015-09-21: qty 200

## 2015-09-21 MED ORDER — MIDAZOLAM HCL 2 MG/2ML IJ SOLN
INTRAMUSCULAR | Status: DC | PRN
  Administered 2015-09-21: 2 mg via INTRAVENOUS

## 2015-09-21 MED ORDER — FENTANYL CITRATE (PF) 100 MCG/2ML IJ SOLN
25.0000 ug | INTRAMUSCULAR | Status: DC | PRN
  Administered 2015-09-21 (×3): 25 ug via INTRAVENOUS

## 2015-09-21 MED ORDER — FENTANYL CITRATE (PF) 100 MCG/2ML IJ SOLN
INTRAMUSCULAR | Status: AC
  Administered 2015-09-21: 25 ug via INTRAVENOUS
  Filled 2015-09-21: qty 2

## 2015-09-21 MED ORDER — PROPOFOL 10 MG/ML IV BOLUS
INTRAVENOUS | Status: DC | PRN
  Administered 2015-09-21: 200 mg via INTRAVENOUS

## 2015-09-21 MED ORDER — ONDANSETRON HCL 4 MG/2ML IJ SOLN
4.0000 mg | Freq: Once | INTRAMUSCULAR | Status: DC | PRN
Start: 2015-09-21 — End: 2015-09-21

## 2015-09-21 MED ORDER — LIDOCAINE HCL (CARDIAC) 20 MG/ML IV SOLN
INTRAVENOUS | Status: DC | PRN
  Administered 2015-09-21: 100 mg via INTRAVENOUS

## 2015-09-21 MED ORDER — IOTHALAMATE MEGLUMINE 43 % IV SOLN
INTRAVENOUS | Status: DC | PRN
  Administered 2015-09-21: 15 mL

## 2015-09-21 MED ORDER — FAMOTIDINE 20 MG PO TABS
ORAL_TABLET | ORAL | Status: AC
  Administered 2015-09-21: 20 mg via ORAL
  Filled 2015-09-21: qty 1

## 2015-09-21 MED ORDER — FAMOTIDINE 20 MG PO TABS
20.0000 mg | ORAL_TABLET | Freq: Once | ORAL | Status: AC
  Administered 2015-09-21: 20 mg via ORAL

## 2015-09-21 SURGICAL SUPPLY — 33 items
ADAPTER SCOPE UROLOK II (MISCELLANEOUS) IMPLANT
BAG DRAIN CYSTO-URO LG1000N (MISCELLANEOUS) ×3 IMPLANT
BASKET ZERO TIP 1.9FR (BASKET) ×3 IMPLANT
CATH FOL 2WAY LX 16X5 (CATHETERS) ×3 IMPLANT
CATH URETL 5X70 OPEN END (CATHETERS) ×3 IMPLANT
CNTNR SPEC 2.5X3XGRAD LEK (MISCELLANEOUS) ×1
CONRAY 43 FOR UROLOGY 50M (MISCELLANEOUS) ×3 IMPLANT
CONT SPEC 4OZ STER OR WHT (MISCELLANEOUS) ×2
CONTAINER SPEC 2.5X3XGRAD LEK (MISCELLANEOUS) ×1 IMPLANT
DRAPE UTILITY 15X26 TOWEL STRL (DRAPES) ×3 IMPLANT
GLOVE BIO SURGEON STRL SZ 6.5 (GLOVE) ×2 IMPLANT
GLOVE BIO SURGEONS STRL SZ 6.5 (GLOVE) ×1
GOWN STRL REUS W/ TWL LRG LVL4 (GOWN DISPOSABLE) ×2 IMPLANT
GOWN STRL REUS W/TWL LRG LVL4 (GOWN DISPOSABLE) ×4
GUIDEWIRE GREEN .038 145CM (MISCELLANEOUS) ×3 IMPLANT
HOLDER FOLEY CATH W/STRAP (MISCELLANEOUS) ×3 IMPLANT
INTRODUCER DILATOR DOUBLE (INTRODUCER) IMPLANT
KIT RM TURNOVER CYSTO AR (KITS) ×3 IMPLANT
LASER FIBER 200M SMARTSCOPE (Laser) ×3 IMPLANT
PACK CYSTO AR (MISCELLANEOUS) ×3 IMPLANT
PREP PVP WINGED SPONGE (MISCELLANEOUS) ×3 IMPLANT
PUMP SINGLE ACTION SAP (PUMP) IMPLANT
SENSORWIRE 0.038 NOT ANGLED (WIRE) ×6
SET CYSTO W/LG BORE CLAMP LF (SET/KITS/TRAYS/PACK) ×3 IMPLANT
SHEATH URETERAL 12FRX35CM (MISCELLANEOUS) IMPLANT
SOL .9 NS 3000ML IRR  AL (IV SOLUTION) ×2
SOL .9 NS 3000ML IRR UROMATIC (IV SOLUTION) ×1 IMPLANT
STENT URET 6FRX24 CONTOUR (STENTS) ×3 IMPLANT
STENT URET 6FRX26 CONTOUR (STENTS) IMPLANT
SURGILUBE 2OZ TUBE FLIPTOP (MISCELLANEOUS) ×3 IMPLANT
SYRINGE IRR TOOMEY STRL 70CC (SYRINGE) ×3 IMPLANT
WATER STERILE IRR 1000ML POUR (IV SOLUTION) ×3 IMPLANT
WIRE SENSOR 0.038 NOT ANGLED (WIRE) ×2 IMPLANT

## 2015-09-21 NOTE — Discharge Instructions (Signed)
You have a ureteral stent in place.  This is a tube that extends from your kidney to your bladder.  This may cause urinary bleeding, burning with urination, and urinary frequency.  Please call our office or present to the ED if you develop fevers >101 or pain which is not able to be controlled with oral pain medications.  You may be given either Flomax and/ or ditropan to help with bladder spasms and stent pain in addition to pain medications.    Your stent is on a string. You should untape it from your left inner thigh in 2-3 days, pulled the string gently until the entire stent is removed. If you have any questions or concerns, please call our office.  [facility] [address], [address] [place], [place] [address] [phone]

## 2015-09-21 NOTE — H&P (View-Only) (Signed)
I have been asked to see the patient by Dr. Lacretia Nicks, for evaluation and management of right proximal ureteral stone.  History of present illness: 40-year-old female with a history of right-sided nephrolithiasis who presented to the emergency department today for worsening right-sided flank pain. The patient was seen yesterday in the ER where evaluation was performed including a CT scan showing a 5 mm times 7 mm right proximal ureteral stone.  After adequate pain control she was discharged home on medical expulsion therapy. The patient then repositioned in today with poorly controlled pain. She denies any fevers or chills. She denies any dysuria or gross hematuria. She is not a progressive voiding symptoms. She has been unable to really keep any solid food down. Through the course the day she has not taken any of the medications prescribed to her from the emergency department from fear of the side effects including respiratory depression and death. In the emergency room, the patient's pain was quickly managed with IV pain medication. CBC did show a white count that had risen from 8 to 16. However, the remaining aspects of her labs also reflected dehydration. Her urinalysis demonstrated pyuria without clear evidence of infection.  The patient was given an IV dose of Levaquin and rehydration.  Review of systems: A 12 point comprehensive review of systems was obtained and is negative unless otherwise stated in the history of present illness.  Patient Active Problem List   Diagnosis Date Noted  . Right ureteral stone 02/03/2015  . Hydronephrosis, right 02/03/2015  . Gross hematuria 02/03/2015  . 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 Biliary calculi 12/16/2013  . Clinical depression 12/16/2013  . Gastro-esophageal reflux disease without esophagitis 12/16/2013  . Headache, migraine 12/16/2013  . Unspecified vitamin D deficiency 01/13/2013  . Hidradenitis 01/13/2013    No current  facility-administered medications on file prior to encounter.   Current Outpatient Prescriptions on File Prior to Encounter  Medication Sig Dispense Refill  . ibuprofen (ADVIL,MOTRIN) 400 MG tablet Take 800 mg by mouth every 6 (six) hours as needed for headache or mild pain.     Marland Kitchen HYDROcodone-acetaminophen (NORCO/VICODIN) 5-325 MG tablet Take 1-2 tablets by mouth every 6 (six) hours as needed for moderate pain. (Patient not taking: Reported on 09/06/2015) 10 tablet 0  . ondansetron (ZOFRAN ODT) 4 MG disintegrating tablet Take 1 tablet (4 mg total) by mouth every 8 (eight) hours as needed for nausea or vomiting. 20 tablet 0  . oxyCODONE-acetaminophen (ROXICET) 5-325 MG tablet Take 1 tablet by mouth every 6 (six) hours as needed. (Patient taking differently: Take 1 tablet by mouth every 6 (six) hours as needed for severe pain. ) 20 tablet 0  . tamsulosin (FLOMAX) 0.4 MG CAPS capsule Take 1 capsule (0.4 mg total) by mouth daily. 30 capsule 0    Past Medical History  Diagnosis Date  . Vitamin D deficiency   . Anxiety   . GERD (gastroesophageal reflux disease)   . Chronic kidney disease     Kidney stones  . Headache   . Renal calculi     Past Surgical History  Procedure Laterality Date  . Cholecystectomy    . Oophorectomy Right 1996    Ovarian cyst  . Cystoscopy with stent placement Right 01/29/2015    Procedure: CYSTOSCOPY WITH STENT PLACEMENT;  Surgeon: Crist Fat, MD;  Location: [facility];  Service: Urology;  Laterality: Right;  . Ureteroscopy with holmium laser lithotripsy Right 02/10/2015    Procedure: URETEROSCOPY WITH HOLMIUM  LASER LITHOTRIPSY;  Surgeon: Lorraine Lax, MD;  Location: [facility];  Service: Urology;  Laterality: Right;  . Cystoscopy w/ ureteral stent removal Right 02/10/2015    Procedure: CYSTOSCOPY WITH STENT REMOVAL;  Surgeon: Lorraine Lax, MD;  Location: [facility];  Service: Urology;  Laterality: Right;  . Cystoscopy with stent placement Right 02/10/2015     Procedure: CYSTOSCOPY WITH STENT PLACEMENT;  Surgeon: Lorraine Lax, MD;  Location: [facility];  Service: Urology;  Laterality: Right;  . Extracorporeal shock wave lithotripsy Right 04/22/2015    Procedure: EXTRACORPOREAL SHOCK WAVE LITHOTRIPSY (ESWL);  Surgeon: Hildred Laser, MD;  Location: [facility];  Service: Urology;  Laterality: Right;  . Cystoscopy/retrograde/ureteroscopy/stone extraction with basket  05/26/2015    Procedure: CYSTOSCOPY/RETROGRADE/URETEROSCOPY/STONE EXTRACTION WITH BASKET;  Surgeon: Vanna Scotland, MD;  Location: [facility];  Service: Urology;;  . Cystoscopy/ureteroscopy/holmium laser  05/26/2015    Procedure: CYSTOSCOPY/URETEROSCOPY/HOLMIUM LASER;  Surgeon: Vanna Scotland, MD;  Location: [facility];  Service: Urology;;    Social History  Substance Use Topics  . Smoking status: Current Every Day Smoker -- 1.00 packs/day    Types: Cigarettes  . Smokeless tobacco: Never Used  . Alcohol Use: Yes     Comment: rare    Family History  Problem Relation Age of Onset  . Nephrolithiasis Paternal Grandfather     PE: Filed Vitals:   09/06/15 1439 09/06/15 1527  BP: 135/81 138/87  Pulse: 83 80  Temp: 98.6 F (37 C)   TempSrc: Oral   Resp: 18 15  Height: 5' 3.5" (1.613 m)   Weight: 108.863 kg (240 lb)   SpO2: 97% 96%   Patient appears to be in no acute distress, morbidly obese  patient is alert and oriented x3 Atraumatic normocephalic head No cervical or supraclavicular lymphadenopathy appreciated No increased work of breathing, no audible wheezes/rhonchi Regular sinus rhythm/rate Abdomen is mildly tender to palpation on the right. She has right-sided CVA tenderness. Lower extremities are symmetric without appreciable edema Grossly neurologically intact No identifiable skin lesions   Recent Labs  09/05/15 0648 09/06/15 1631  WBC 8.5 16.3*  HGB 13.8 14.5  HCT 40.9 43.6    Recent Labs  09/05/15 0648 09/06/15 1730  NA 136 133*  K 3.8 3.4*  CL 112* 107   CO2 20* 21*  GLUCOSE 116* 95  BUN 12 10  CREATININE 0.76 1.19*  CALCIUM 8.1* 8.3*   No results for input(s): LABPT, INR in the last 72 hours. No results for input(s): LABURIN in the last 72 hours. Results for orders placed or performed in visit on 08/18/15  Microscopic Examination     Status: Abnormal   Collection Time: 08/18/15  2:55 PM  Result Value Ref Range Status   WBC, UA None seen 0 -  5 /hpf Final   RBC, UA None seen 0 -  2 /hpf Final   Epithelial Cells (non renal) 0-10 0 - 10 /hpf Final   Crystals Present (A) N/A Final   Crystal Type Amorphous Sediment N/A Final   Bacteria, UA None seen None seen/Few Final    Imaging: I'm independently reviewed the patient's CT scan from 09/05/15 which demonstrates a right proximal ureteral stone with mild hydronephrosis. The Hounsfield units of the stone are roughly 500-600 on slow units. Skin is stone distance is proximal 18 cm.  Imp: The patient has a right 5 x 7 mm proximal ureteral stone. At this point her pain has been brought into reasonable control. Prior to coming to  the emergency room today she had not taken any other narcotic pain medication that she was prescribed. Her white blood cell count is elevated, no other markers would indicate that she has a true infection.  Recommendations: I went over the patient's situation with both her and her aunt. Her clinical appearance and exam are reassuring that the patient does not have an infection but rather is dehydrated. We discussed the treatment options for her which would include going to the operating room urgently tonight for a ureteral stent placement, being admitted for observation and pain control with the option of proceeding tomorrow afternoon to the OR for ureteroscopy and laser fragmentation and basket removal followed by ureteral stent. And third we discussed the option of discharge home tonight with close follow-up to urology tomorrow so that we can complete the paperwork for  shockwave lithotripsy on Thursday morning. I discussed all these options and quite a bit of detail. The patient is very reluctant to undergo ureteroscopy with stent placement based on her prior experience. As such, the patient would like to proceed with shockwave lithotripsy. I described for the patient the procedure in quite in detail. I told the patient that there is a 70-75% chance that the stone would fragment without any additional procedures required. She may also develop renal colic and then ultimately passed some of the fragments. Ideally, she would pass all the stone fragments without any additional symptoms. We also briefly discussed the risks of hematoma and damage to the surrounding structures.  The patient is being discharged home from the emergency room tonight with ciprofloxacin 500 mg daily. I also instructed the patient on the proper use of the Percocet prescription that she was provided last night. She was also given return precautions. The patient will be seen tomorrow in the urology clinic so that she can sign all the required documents for shockwave lithotripsy.   Berniece SalinesHERRICK, Yenifer Saccente W

## 2015-09-21 NOTE — Anesthesia Postprocedure Evaluation (Signed)
Anesthesia Post Note  Patient: Tanya Henderson  Procedure(s) Performed: Procedure(s) (LRB): URETEROSCOPY WITH HOLMIUM LASER LITHOTRIPSY/ fragmentation and removal (Right) CYSTOSCOPY WITH STENT REPLACEMENT (Right)  Patient location during evaluation: PACU Anesthesia Type: General Level of consciousness: awake and alert Pain management: pain level controlled Vital Signs Assessment: post-procedure vital signs reviewed and stable Respiratory status: spontaneous breathing, nonlabored ventilation, respiratory function stable and patient connected to nasal cannula oxygen Cardiovascular status: blood pressure returned to baseline and stable Postop Assessment: no signs of nausea or vomiting Anesthetic complications: no    Last Vitals:  Filed Vitals:   09/21/15 1055 09/21/15 1117  BP: 132/78 123/73  Pulse: 64 63  Temp:    Resp: 14 14    Last Pain:  Filed Vitals:   09/21/15 1118  PainSc: 4                  Lenard SimmerAndrew  Grosser

## 2015-09-21 NOTE — Transfer of Care (Signed)
Immediate Anesthesia Transfer of Care Note  Patient: Tanya Henderson  Procedure(s) Performed: Procedure(s): URETEROSCOPY WITH HOLMIUM LASER LITHOTRIPSY/ fragmentation and removal (Right) CYSTOSCOPY WITH STENT REPLACEMENT (Right)  Patient Location: PACU  Anesthesia Type:General  Level of Consciousness: awake and patient cooperative  Airway & Oxygen Therapy: Patient Spontanous Breathing and Patient connected to face mask oxygen  Post-op Assessment: Report given to RN and Post -op Vital signs reviewed and stable  Post vital signs: stable  Last Vitals:  Filed Vitals:   09/21/15 0739 09/21/15 0935  BP: 139/92 117/53  Pulse: 85 70  Temp: 37 C 36 C  Resp: 16 13    Complications: No apparent anesthesia complications

## 2015-09-21 NOTE — Anesthesia Procedure Notes (Signed)
Procedure Name: Intubation Date/Time: 09/21/2015 8:39 AM Performed by: Irving BurtonBACHICH, Summers Buendia Pre-anesthesia Checklist: Patient identified, Emergency Drugs available, Suction available and Patient being monitored Patient Re-evaluated:Patient Re-evaluated prior to inductionOxygen Delivery Method: Circle system utilized Intubation Type: IV induction Ventilation: Mask ventilation without difficulty Laryngoscope Size: Mac and 3 Grade View: Grade I Tube type: Oral Tube size: 7.0 mm Number of attempts: 1 Airway Equipment and Method: Stylet Placement Confirmation: ETT inserted through vocal cords under direct vision,  positive ETCO2 and breath sounds checked- equal and bilateral Secured at: 22 cm Tube secured with: Tape Dental Injury: Teeth and Oropharynx as per pre-operative assessment

## 2015-09-21 NOTE — Interval H&P Note (Signed)
History and Physical Interval Note:  09/21/2015 8:18 AM  Lorea Bullock-Sassone  has presented today for surgery, with the diagnosis of right ureteral stone  The various methods of treatment have been discussed with the patient and family. After consideration of risks, benefits and other options for treatment, the patient has consented to  Procedure(s): URETEROSCOPY WITH HOLMIUM LASER LITHOTRIPSY/ fragmentation and removal (Right) CYSTOSCOPY WITH STENT REPLACEMENT (Right) as a surgical intervention .  The patient's history has been reviewed, patient examined, no change in status, stable for surgery.  I have reviewed the patient's chart and labs.  Questions were answered to the patient's satisfaction.    RRR CTAB   Vanna ScotlandAshley Valisa Karpel

## 2015-09-21 NOTE — Op Note (Signed)
Date of procedure: 09/21/2015  Preoperative diagnosis:  1. Right proximal ureteral stone 2. Right nonobstructing kidney stone   Postoperative diagnosis:  1. Same as above   Procedure: 1. Right ureteroscopy 2. Laser lithotripsy 3. Right ureteral stent exchange 4. Right retrograde pyelogram  Surgeon: Vanna Scotland, MD  Anesthesia: General  Complications: None  Intraoperative findings: 6 mm proximal ureteral stone identified within the lower pole calyx, presumably pushed back into the upper tract collecting system at the time of stent placement. Small 2-3 mm stone adjacent to this also treated.  EBL: Minimal  Specimens: None  Drains: 6 x 24 French double-J ureteral stent on right, string left in place  Indication: Tanya Henderson is a 40 y.o. patient with recurrent nephrolithiasis who underwent right ureteroscopy late last year. She returned to the emergency room with acute onset right flank pain found to have a 6 mm proximal ureteral stone. She had difficulty with pain control and in the setting of possible infection, underwent right ureteral stent placement. She returns today for definitive management of her stone.  After reviewing the management options for treatment, she elected to proceed with the above surgical procedure(s). We have discussed the potential benefits and risks of the procedure, side effects of the proposed treatment, the likelihood of the patient achieving the goals of the procedure, and any potential problems that might occur during the procedure or recuperation. Informed consent has been obtained.  Description of procedure:  The patient was taken to the operating room and general anesthesia was induced.  The patient was placed in the dorsal lithotomy position, prepped and draped in the usual sterile fashion, and preoperative antibiotics were administered. A preoperative time-out was performed.   At this point time, a rigid 21 [place] scope was advanced  per urethra into the bladder. Attention was turned to the right ureteral orifice from which a right ureteral stent was seen emanating. The distal coil of the stent was grasped and brought to level of the urethral meatus. The stent was then cannulated using a sensor wire up to level of the kidney under fluoroscopic guidance. The stent was removed leaving the wire in place. A rigid cystoscope was then advanced back into the bladder attention was turned to the right ureteral orifice. A Super Stiff wire was advanced up to level of the kidney to be used as a working wire. Next, a 8 [place] flexible dual lumen ureteroscope was advanced over the Super Stiff wire up to level of the kidney without difficulty. A formal pyeloscopy was then performed identifying each and every calyx. 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 In the lower pole, this 6-7 mm stone was seen and an adjacent much smaller stone which was previously identified on CT scan. At 200 micron fiber was brought in and using settings of 0.2 J and 30 Hz, the stone was fragmented into dust like particles. Of note, the stone was slightly amorphous and soft which is more consistent with of matrix stone. The smaller adjacent stone was also completely fragmented. Once fragmentation was satisfactory, the scope was backed to level of the proximal ureter and a retrograde pyelogram was performed by injecting contrast through the scope. This created a roadmap to ensure that each never calyx was identified and directly visualized. Once the kidney was cleared of all stone burden, a 1.9 [place] to plus nitinol basket was used to extract some amorphous stone debris.  As the scope was backed down the length  of the ureter, the ureter was carefully inspected which revealed no evidence of trauma, edema, or additional stone fragments. The flexible scope was advanced back up to the level of the kidney without difficulty and some additional fragment  was extracted. At this point in time, the procedure was deemed complete.  A 6 x 24 French double-J ureteral stent was advanced over the sensor wire up to level of the kidney under fluoroscopic guidance. The wire was partially drawn until full coil was noted within the renal pelvis. The wire was then fully withdrawn and a full coil was noted within the bladder. The string was left on the stent. The patient was cleaned dried and the stent string was affixed to the patient's left inner thigh using Mastisol and Tegaderm. She was then repositioned supine position, reversed from anesthesia, taken the PACU in stable condition. Next  Plan: Patient will remove her own stent in 2-3 days. She'll follow-up in 4 weeks with a renal ultrasound prior.  She will need a metabolic workup given the frequency of her stones.  Vanna ScotlandAshley Allecia Bells, M.D.

## 2015-09-21 NOTE — Anesthesia Preprocedure Evaluation (Signed)
Anesthesia Evaluation  Patient identified by MRN, date of birth, ID band Patient awake    Reviewed: Allergy & Precautions, H&P , NPO status , Patient's Chart, lab work & pertinent test results, reviewed documented beta blocker date and time   History of Anesthesia Complications Negative for: history of anesthetic complications  Airway Mallampati: IV  TM Distance: >3 FB Neck ROM: full    Dental no notable dental hx. (+) Teeth Intact, Chipped   Pulmonary neg shortness of breath, neg sleep apnea, neg COPD, neg recent URI, Current Smoker,    Pulmonary exam normal breath sounds clear to auscultation       Cardiovascular Exercise Tolerance: Good negative cardio ROS Normal cardiovascular exam Rhythm:regular Rate:Normal     Neuro/Psych  Headaches, PSYCHIATRIC DISORDERS (depression and anxiety) negative neurological ROS     GI/Hepatic Neg liver ROS, GERD  Medicated and Controlled,  Endo/Other  negative endocrine ROSneg diabetesMorbid obesity  Renal/GU Renal InsufficiencyRenal diseaseRight ureteral stone  negative genitourinary   Musculoskeletal negative musculoskeletal ROS (+)   Abdominal   Peds negative pediatric ROS (+)  Hematology negative hematology ROS (+)   Anesthesia Other Findings Past Medical History:   Vitamin D deficiency                                         Anxiety                                                      GERD (gastroesophageal reflux disease)                       Chronic kidney disease                                         Comment:Kidney stones   Headache                                                     Reproductive/Obstetrics negative OB ROS                             Anesthesia Physical  Anesthesia Plan  ASA: II  Anesthesia Plan: General   Post-op Pain Management:    Induction: Intravenous  Airway Management Planned: Oral ETT and LMA  Additional  Equipment:   Intra-op Plan:   Post-operative Plan: Extubation in OR  Informed Consent: I have reviewed the patients History and Physical, chart, labs and discussed the procedure including the risks, benefits and alternatives for the proposed anesthesia with the patient or authorized representative who has indicated his/her understanding and acceptance.   Dental advisory given  Plan Discussed with: CRNA and Surgeon  Anesthesia Plan Comments:         Anesthesia Quick Evaluation

## 2015-09-24 ENCOUNTER — Ambulatory Visit: Payer: Managed Care, Other (non HMO) | Admitting: *Deleted

## 2015-09-24 DIAGNOSIS — N201 Calculus of ureter: Secondary | ICD-10-CM

## 2015-09-24 NOTE — Progress Notes (Signed)
Patient did not feel comfortable to pull stent out. Stent removed no problems.

## 2015-09-29 ENCOUNTER — Other Ambulatory Visit: Payer: Managed Care, Other (non HMO) | Admitting: Urology

## 2015-09-30 ENCOUNTER — Encounter: Payer: Self-pay | Admitting: Radiology

## 2015-10-20 ENCOUNTER — Ambulatory Visit
Admission: RE | Admit: 2015-10-20 | Discharge: 2015-10-20 | Disposition: A | Discharge status: Home / Self Care | Payer: Managed Care, Other (non HMO) | Source: Ambulatory Visit | Attending: Urology | Admitting: Urology

## 2015-10-20 DIAGNOSIS — N201 Calculus of ureter: Secondary | ICD-10-CM | POA: Insufficient documentation

## 2015-10-20 DIAGNOSIS — N200 Calculus of kidney: Secondary | ICD-10-CM | POA: Insufficient documentation

## 2015-10-22 ENCOUNTER — Ambulatory Visit (INDEPENDENT_AMBULATORY_CARE_PROVIDER_SITE_OTHER): Payer: Managed Care, Other (non HMO) | Admitting: Urology

## 2015-10-22 VITALS — BP 130/78 | HR 74 | Ht 63.0 in | Wt 240.0 lb

## 2015-10-22 DIAGNOSIS — N200 Calculus of kidney: Secondary | ICD-10-CM | POA: Diagnosis not present

## 2015-10-22 DIAGNOSIS — N201 Calculus of ureter: Secondary | ICD-10-CM

## 2015-10-22 NOTE — Progress Notes (Signed)
5:10 PM  10/22/2015   Tanya Henderson 20-Aug-1975 [id number]  Referring provider: Marisue Ivan, MD [phone] [facility] [address] [facility] [place], [place] [address]  Chief Complaint  Patient presents with  . Cysto Stent Removal    HPI: 40 yo F with history of kidney stones status post right ureteroscopy, laser lithotripsy on 05/26/2015. At that time, a small 5 mm right lower pole was identified but otherwise there were no other nonobstructing stones other than some small Randall's plaques which were ablated.  Unfortunately, she developed recurrent right flank pain and was found to have another obstructing right ureteral stone.  She returned to the operating room on 09/21/2015 for right ureteroscopy, laser lithotripsy, right ureteral stent placement. She removed her stent on her own.  Follow-up renal ultrasound a few days ago was negative for any hydronephrosis bilaterally. There are some nonobstructing stone on the left side.  Today she denies any urinary symptoms.   She does have mild occasional right lower back pain which comes and goes.  No fevers or chills. No nausea or vomiting.   Prior to last year, she is never previously passed stones.  Stone [employer] with calcium oxalate dihydrate 25%, calcium oxalate monohydrate 55%, calcium phosphate 20% area      PMH: Past Medical History  Diagnosis Date  . Vitamin D deficiency   . Anxiety   . GERD (gastroesophageal reflux disease)   . Chronic kidney disease     Kidney stones  . Headache   . Renal calculi     Surgical History: Past Surgical History  Procedure Laterality Date  . Cholecystectomy    . Oophorectomy Right 1996    Ovarian cyst  . Cystoscopy with stent placement Right 01/29/2015    Procedure: CYSTOSCOPY WITH STENT PLACEMENT;  Surgeon: Crist Fat, MD;  Location: [facility];  Service: Urology;  Laterality: Right;  . Ureteroscopy with holmium laser lithotripsy Right 02/10/2015   Procedure: URETEROSCOPY WITH HOLMIUM LASER LITHOTRIPSY;  Surgeon: Lorraine Lax, MD;  Location: [facility];  Service: Urology;  Laterality: Right;  . Cystoscopy w/ ureteral stent removal Right 02/10/2015    Procedure: CYSTOSCOPY WITH STENT REMOVAL;  Surgeon: Lorraine Lax, MD;  Location: [facility];  Service: Urology;  Laterality: Right;  . Cystoscopy with stent placement Right 02/10/2015    Procedure: CYSTOSCOPY WITH STENT PLACEMENT;  Surgeon: Lorraine Lax, MD;  Location: [facility];  Service: Urology;  Laterality: Right;  . Extracorporeal shock wave lithotripsy Right 04/22/2015    Procedure: EXTRACORPOREAL SHOCK WAVE LITHOTRIPSY (ESWL);  Surgeon: Hildred Laser, MD;  Location: [facility];  Service: Urology;  Laterality: Right;  . Cystoscopy/retrograde/ureteroscopy/stone extraction with basket  05/26/2015    Procedure: CYSTOSCOPY/RETROGRADE/URETEROSCOPY/STONE EXTRACTION WITH BASKET;  Surgeon: Vanna Scotland, MD;  Location: [facility];  Service: Urology;;  . Cystoscopy/ureteroscopy/holmium laser  05/26/2015    Procedure: CYSTOSCOPY/URETEROSCOPY/HOLMIUM LASER;  Surgeon: Vanna Scotland, MD;  Location: [facility];  Service: Urology;;  . Cystoscopy with stent placement Right 09/07/2015    Procedure: CYSTOSCOPY WITH STENT PLACEMENT;  Surgeon: Crist Fat, MD;  Location: [facility];  Service: Urology;  Laterality: Right;  . Ureteroscopy with holmium laser lithotripsy Right 09/21/2015    Procedure: URETEROSCOPY WITH HOLMIUM LASER LITHOTRIPSY/ fragmentation and removal;  Surgeon: Vanna Scotland, MD;  Location: [facility];  Service: Urology;  Laterality: Right;  . Cystoscopy w/ ureteral stent placement Right 09/21/2015    Procedure: CYSTOSCOPY WITH STENT REPLACEMENT;  Surgeon: Vanna Scotland, MD;  Location: [facility];  Service: Urology;  Laterality: Right;    Home Medications:    Medication List       This list is accurate as of: 10/22/15  5:10 PM.  Always use your most recent med list.                ibuprofen 400 MG tablet  Commonly known as:  ADVIL,MOTRIN  Take 800 mg by mouth every 6 (six) hours as needed for headache or mild pain.        Allergies:  Allergies  Allergen Reactions  . Penicillins Other (See Comments)    Reaction:  Unknown     Family History: Family History  Problem Relation Age of Onset  . Nephrolithiasis Paternal Grandfather     Social History:  reports that she has been smoking Cigarettes.  She has a 8 pack-year smoking history. She has never used smokeless tobacco. She reports that she drinks alcohol. She reports that she does not use illicit drugs.  ROS: UROLOGY Frequent Urination?: No Hard to postpone urination?: No Burning/pain with urination?: No Get up at night to urinate?: No Leakage of urine?: No Urine stream starts and stops?: No Trouble starting stream?: No Do you have to strain to urinate?: No Blood in urine?: No Urinary tract infection?: No Sexually transmitted disease?: No Injury to kidneys or bladder?: No Painful intercourse?: No Weak stream?: No Currently pregnant?: No Vaginal bleeding?: No Last menstrual period?: 10-15-15  Gastrointestinal Nausea?: No Vomiting?: No Indigestion/heartburn?: Yes Diarrhea?: No Constipation?: No  Constitutional Fever: No Night sweats?: No Weight loss?: No Fatigue?: No  Skin Skin rash/lesions?: No Itching?: No  Eyes Blurred vision?: No Double vision?: No  Ears/Nose/Throat Sore throat?: No Sinus problems?: Yes  Hematologic/Lymphatic Swollen glands?: No Easy bruising?: No  Cardiovascular Leg swelling?: No Chest pain?: No  Respiratory Cough?: No Shortness of breath?: No  Endocrine Excessive thirst?: No  Musculoskeletal Back pain?: Yes Joint pain?: No  Neurological Headaches?: No Dizziness?: No  Psychologic Depression?: No Anxiety?: No  Physical Exam: BP 130/78 mmHg  Pulse 74  Ht 5\' 3"  (1.6 m)  Wt 240 lb (108.863 kg)  BMI 42.52 kg/m2  LMP 10/15/2015  (Approximate)  Constitutional:  Alert and oriented, No acute distress. HEENT: [place] AT, moist mucus membranes.  Trachea midline, no masses. Cardiovascular: No clubbing, cyanosis, or edema. Respiratory: Normal respiratory effort, no increased work of breathing. GI: Abdomen is soft, nontender, nondistended, no abdominal masses.  Obese.   GU: No CVA tenderness.  Skin: No rashes, bruises or suspicious lesions. Neurologic: Grossly intact, no focal deficits, moving all 4 extremities. Psychiatric: Normal mood and affect.  Laboratory Data: Lab Results  Component Value Date   WBC 16.3* 09/06/2015   HGB 14.5 09/06/2015   HCT 43.6 09/06/2015   MCV 89.0 09/06/2015   PLT 253 09/06/2015    Lab Results  Component Value Date   CREATININE 1.19* 09/06/2015   Pertinent Imaging: Study Result     CLINICAL DATA: Obstructing right UPJ stone on 09/05/2015 CT study, status post right ureteroscopy, follow-up right hydronephrosis.  EXAM: RENAL / URINARY TRACT ULTRASOUND COMPLETE  COMPARISON: 07/01/2015 renal sonogram and 09/05/2015 CT abdomen/ pelvis.  FINDINGS: Right Kidney:  Length: 12.4 cm. Echogenicity within normal limits. No mass or hydronephrosis. No renal stones large enough to cause acoustic shadowing.  Left Kidney:  Length: 12.1 cm. Echogenicity within normal limits. No mass or hydronephrosis. Shadowing nonobstructing 5 mm stone in the lower left kidney, stable since 09/05/2015. Possible nonobstructing 3 mm stone in the  mid to upper left kidney.  Bladder:  Appears normal for degree of bladder distention. Bilateral ureteral jets are demonstrated in the bladder lumen.  IMPRESSION: 1. No residual hydronephrosis. Bilateral ureteral jets are seen in the bladder lumen. 2. Nonobstructing left renal stones. 3. Normal bladder.   Electronically Signed  By: Delbert Phenix M.D.  On: 10/20/2015 16:35     Assessment & Plan:    1. Nephrolithiasis History of  nephrolithiasis status post right ureteroscopy x 2 for recurrent stones over the past 6 months.   Follow-up renal ultrasound negative. She does have some nonobstructing left-sided stones which are small.   Reviewed stone prevention techniques.  We also discussed indications for metabolic workup.  Given her history of multiple recurrent stones, I do feel that this is now indicated. We reviewed the instructions. We will call her with these results.  - Urinalysis, Complete - DG Abd 1 View   Return in about 6 months (around 04/23/2016) for KUB prior (will call with 24 hour urine results).  Vanna Scotland, MD  [facility] [address] [place], [place] [address] [phone]

## 2015-12-29 ENCOUNTER — Other Ambulatory Visit: Payer: Self-pay | Admitting: Obstetrics and Gynecology

## 2015-12-29 DIAGNOSIS — Z1231 Encounter for screening mammogram for malignant neoplasm of breast: Secondary | ICD-10-CM

## 2016-01-17 ENCOUNTER — Ambulatory Visit
Admission: RE | Admit: 2016-01-17 | Discharge: 2016-01-17 | Disposition: A | Discharge status: Home / Self Care | Payer: Managed Care, Other (non HMO) | Source: Ambulatory Visit | Attending: Obstetrics and Gynecology | Admitting: Obstetrics and Gynecology

## 2016-01-17 ENCOUNTER — Other Ambulatory Visit: Payer: Self-pay | Admitting: Obstetrics and Gynecology

## 2016-01-17 DIAGNOSIS — Z1231 Encounter for screening mammogram for malignant neoplasm of breast: Secondary | ICD-10-CM | POA: Diagnosis not present

## 2016-03-27 ENCOUNTER — Ambulatory Visit (INDEPENDENT_AMBULATORY_CARE_PROVIDER_SITE_OTHER): Payer: Managed Care, Other (non HMO)

## 2016-03-27 VITALS — BP 115/71 | HR 80 | Temp 98.0°F | Ht 63.0 in | Wt 241.5 lb

## 2016-03-27 DIAGNOSIS — N390 Urinary tract infection, site not specified: Secondary | ICD-10-CM | POA: Diagnosis not present

## 2016-03-27 LAB — MICROSCOPIC EXAMINATION
BACTERIA UA: NONE SEEN
WBC UA: NONE SEEN /HPF (ref 0–?)

## 2016-03-27 LAB — URINALYSIS, COMPLETE
Bilirubin, UA: NEGATIVE
GLUCOSE, UA: NEGATIVE
Ketones, UA: NEGATIVE
Leukocytes, UA: NEGATIVE
Nitrite, UA: NEGATIVE
PROTEIN UA: NEGATIVE
Specific Gravity, UA: 1.015 (ref 1.005–1.030)
UUROB: 1 mg/dL (ref 0.2–1.0)
pH, UA: 7.5 (ref 5.0–7.5)

## 2016-03-27 NOTE — Progress Notes (Signed)
Pt came in today with c/o urinary frequency, back pain, lower abd pain, and urinary urgency. A clean catch was obtained for u/a and cx.   Blood pressure 115/71, pulse 80, temperature 98 F (36.7 C), height 5\' 3"  (1.6 m), weight 241 lb 8 oz (109.5 kg).

## 2016-03-28 ENCOUNTER — Telehealth: Payer: Self-pay

## 2016-03-28 NOTE — Telephone Encounter (Signed)
LMOM

## 2016-03-28 NOTE — Telephone Encounter (Signed)
Spoke with pt in reference to u/a and possibly passing another stone. Made aware can have a KUB and f/u appt. Pt voiced understanding. Pt stated she would call once she had KUB.

## 2016-03-28 NOTE — Telephone Encounter (Signed)
-----   Message from Vanna ScotlandAshley Brandon, MD sent at 03/28/2016  8:19 AM EDT ----- Please let her know her urine looks fine, no evidence of infection.   If she is concerned she is passing another stone, lets get a KUB and f/u appt  Vanna ScotlandAshley Brandon, MD  ----- Message ----- From: Skeet Latchhelsea C Watkins, LPN Sent: 13/2/440110/02/2016   3:42 PM To: Vanna ScotlandAshley Brandon, MD

## 2016-03-30 ENCOUNTER — Telehealth: Payer: Self-pay | Admitting: Family Medicine

## 2016-03-30 LAB — CULTURE, URINE COMPREHENSIVE

## 2016-03-30 NOTE — Telephone Encounter (Signed)
Patient notified and voiced understanding.

## 2016-03-30 NOTE — Telephone Encounter (Signed)
-----   Message from Vanna ScotlandAshley Brandon, MD sent at 03/30/2016  2:31 PM EDT ----- No evidence of UTI.  Group B strep is a vaginal flora contaminant and UA was negative for infection.  Great news.    Vanna ScotlandAshley Brandon, MD

## 2016-04-25 ENCOUNTER — Ambulatory Visit
Admission: RE | Admit: 2016-04-25 | Discharge: 2016-04-25 | Disposition: A | Discharge status: Home / Self Care | Payer: Managed Care, Other (non HMO) | Source: Ambulatory Visit | Attending: Urology | Admitting: Urology

## 2016-04-25 DIAGNOSIS — N200 Calculus of kidney: Secondary | ICD-10-CM | POA: Diagnosis not present

## 2016-04-28 ENCOUNTER — Ambulatory Visit: Payer: Managed Care, Other (non HMO) | Admitting: Urology

## 2016-04-28 ENCOUNTER — Other Ambulatory Visit: Payer: Self-pay | Admitting: Family Medicine

## 2016-04-28 DIAGNOSIS — N200 Calculus of kidney: Secondary | ICD-10-CM

## 2016-05-01 ENCOUNTER — Telehealth: Payer: Self-pay

## 2016-05-01 NOTE — Telephone Encounter (Signed)
-----   Message from Vanna ScotlandAshley Brandon, MD sent at 04/28/2016  3:15 PM EST ----- This patient was in no show today for her appointment. She did go get her x-ray a few days ago. Great news, shows no obvious stones. Keep up the good work and drink plenty of water.    Vanna ScotlandAshley Brandon, MD

## 2016-05-01 NOTE — Telephone Encounter (Signed)
Spoke with pt in reference to KUB results and missed appt. Pt voiced understanding.

## 2016-05-05 ENCOUNTER — Ambulatory Visit: Payer: Managed Care, Other (non HMO) | Admitting: Urology

## 2016-06-09 ENCOUNTER — Encounter: Payer: Self-pay | Admitting: Emergency Medicine

## 2016-06-09 ENCOUNTER — Emergency Department
Admission: EM | Admit: 2016-06-09 | Discharge: 2016-06-09 | Disposition: A | Discharge status: Home / Self Care | Payer: Managed Care, Other (non HMO) | Attending: Emergency Medicine | Admitting: Emergency Medicine

## 2016-06-09 DIAGNOSIS — N189 Chronic kidney disease, unspecified: Secondary | ICD-10-CM | POA: Insufficient documentation

## 2016-06-09 DIAGNOSIS — K047 Periapical abscess without sinus: Secondary | ICD-10-CM | POA: Diagnosis not present

## 2016-06-09 DIAGNOSIS — F17210 Nicotine dependence, cigarettes, uncomplicated: Secondary | ICD-10-CM | POA: Diagnosis not present

## 2016-06-09 DIAGNOSIS — Z791 Long term (current) use of non-steroidal anti-inflammatories (NSAID): Secondary | ICD-10-CM | POA: Insufficient documentation

## 2016-06-09 DIAGNOSIS — K0889 Other specified disorders of teeth and supporting structures: Secondary | ICD-10-CM | POA: Diagnosis present

## 2016-06-09 MED ORDER — TRAMADOL HCL 50 MG PO TABS: 50.0000 mg | ORAL_TABLET | Freq: Four times a day (QID) | ORAL | 0 refills | Status: AC | PRN

## 2016-06-09 MED ORDER — CLINDAMYCIN HCL 150 MG PO CAPS: 450.0000 mg | ORAL_CAPSULE | Freq: Three times a day (TID) | ORAL | 0 refills | Status: AC

## 2016-06-09 NOTE — Discharge Instructions (Signed)
OPTIONS FOR DENTAL FOLLOW UP CARE ° °[place] Department of Health and Human Services - Local Safety Net Dental Clinics °http://www.ncdhhs.gov/dph/oralhealth/services/safetynetclinics.htm °  °[facility] (336-562-3123) ° °[facility] [place] (919-933-9087) ° °[facility] ([phone] ext [phone]) ° °[facility] ([phone]) ° °[facility] ([phone]) °This clinic caters to the indigent population and is on a lottery system. °Location: °[facility], [address], [place] °Clinic Hours: °Wednesdays from 6pm - 9pm, patients seen by a lottery system. °For dates, call or go to www.med.[facility].edu/[facility]/patients/Dental-[facility] °Services: °Cleanings, fillings and simple extractions. °Payment Options: °DENTAL WORK IS FREE OF CHARGE. Bring proof of income or support. °Best way to get seen: °Arrive at 5:15 pm - this is a lottery, NOT first come/first serve, so arriving earlier will not increase your chances of being seen. °  °  °[facility] °[phone] °Select option 1 for emergencies °  °Location: °[facility], [address], [place] °Clinic Hours: °No walk-ins accepted - call the day before to schedule an appointment. °Check in times are 9:30 am and 1:30 pm. °Services: °Simple extractions, temporary fillings, pulpectomy/pulp debridement, uncomplicated abscess drainage. °Payment Options: °PAYMENT IS DUE AT THE TIME OF SERVICE.  Fee is usually $100-200, additional surgical procedures (e.g. abscess drainage) may be extra. °Cash, checks, Visa/MasterCard accepted.  Can file Medicaid if patient is covered for dental - patient should call case worker to check. °No discount for [facility] patients. °Best way to get seen: °MUST call the day before and get onto the schedule. Can usually be seen the next 1-2 days. No walk-ins accepted. °  °  °[facility] °[phone] °   °Location: °[facility], [address], [place] °Clinic Hours: °M, W, Th, F 8am or 1:30pm, Tues 9a or 1:30 - first come/first served. °Services: °Simple extractions, temporary fillings, uncomplicated abscess drainage.  You do not need to be an [place] resident. °Payment Options: °PAYMENT IS DUE AT THE TIME OF SERVICE. °Dental insurance, otherwise sliding scale - bring proof of income or support. °Depending on income and treatment needed, cost is usually $50-200. °Best way to get seen: °Arrive early as it is first come/first served. °  °  °[facility] °[phone] °  °Location: °[address] °Clinic Hours: °Mon-Thu 8a-5p °Services: °Most basic dental services including extractions and fillings. °Payment Options: °PAYMENT IS DUE AT THE TIME OF SERVICE. °Sliding scale, up to 50% off - bring proof if income or support. °Medicaid with dental option accepted. °Best way to get seen: °Call to schedule an appointment, can usually be seen within 2 weeks OR they will try to see walk-ins - show up at 8a or 2p (you may have to wait). °  °  °[facility] °919-245-2435 °[place] RESIDENTS ONLY °  °Location: °[facility], [address], [place],  [address] °Clinic Hours: By appointment only. °Monday - Thursday 8am-5pm, Friday 8am-12pm °Services: Cleanings, fillings, extractions. °Payment Options: °PAYMENT IS DUE AT THE TIME OF SERVICE. °Cash, Visa or MasterCard. Sliding scale - $30 minimum per service. °Best way to get seen: °Come in to office, complete packet and make an appointment - need proof of income °or support monies for each household member and proof of [place] residence. °Usually takes about a month to get in. °  °  °[facility] °[phone] °  °Location: °[address]   [place] °Clinic Hours: Walk-in Urgent Care Dental Services are offered Monday-Friday  mornings only. °The numbers of emergencies accepted daily is limited to the number of °providers available. °Maximum 15 - Mondays, Wednesdays & Thursdays °Maximum 10 - Tuesdays & Fridays °Services: °You do not need to be a [place] resident to be seen for a dental emergency. °Emergencies are defined as pain, swelling, abnormal bleeding, or dental trauma. Walkins will receive x-rays if needed. °NOTE: Dental cleaning is not an emergency. °Payment Options: °PAYMENT IS DUE AT THE TIME OF SERVICE. °Minimum co-pay is $40.00 for uninsured patients. °Minimum co-pay is $3.00 for Medicaid with dental coverage. °Dental Insurance is accepted and must be presented at time of visit. °Medicare does not cover dental. °Forms of payment: Cash, credit card, checks. °Best way to get seen: °If not previously registered with the clinic, walk-in dental registration begins at 7:15 am and is on a first come/first serve basis. °If previously registered with the clinic, call to make an appointment. °  °  °[facility] °919-776-4359 °[place] RESIDENTS ONLY °  °Location: °[address], [place], [place] °Clinic Hours: °Mon-Thu 10a-2p °Services: Extractions only! °Payment Options: °FREE (donations accepted) - bring proof of income or support °Best way to get seen: °Call and schedule an appointment OR come at 8am on the 1st Monday of every month (except for holidays) when it is first come/first served. °  °  °[facility] °[phone] °  °Location: °[address], [place] °Clinic Hours: °Friday mornings °Services, Payment Options, Best way to get seen: °Call for info °

## 2016-06-09 NOTE — ED Notes (Signed)
Pt informed to return if any life threatening symptoms occur.  

## 2016-06-09 NOTE — ED Provider Notes (Signed)
[facility] Emergency Department Provider Note  ____________________________________________  Time seen: Approximately 9:57 PM  I have reviewed the triage vital signs and the nursing notes.   HISTORY  Chief Complaint Dental Pain    HPI Tanya Henderson is a 40 y.o. female presents to the emergency department with one week of upper left tooth pain. Pain is described as throbbing. Patient states that in the last day patient has noticed some swelling on the left side of cheek. Patient has several known cavities. Patient denies fever. No change in voice, difficulty breathing or difficulty swallowing. Patient is taking ibuprofen and Advil for pain. She is allergic to amoxicillin. Patient has an appointment with the dentist on January 3.   Past Medical History:  Diagnosis Date  . Anxiety   . Chronic kidney disease    Kidney stones  . GERD (gastroesophageal reflux disease)   . Headache   . Renal calculi   . Vitamin D deficiency     Patient Active Problem List   Diagnosis Date Noted  . Right ureteral stone 02/03/2015  . Hydronephrosis, right 02/03/2015  . Gross hematuria 02/03/2015  . 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 Biliary calculi 12/16/2013  . Clinical depression 12/16/2013  . Gastro-esophageal reflux disease without esophagitis 12/16/2013  . Headache, migraine 12/16/2013  . Unspecified vitamin D deficiency 01/13/2013  . Hidradenitis 01/13/2013    Past Surgical History:  Procedure Laterality Date  . CHOLECYSTECTOMY    . CYSTOSCOPY W/ URETERAL STENT PLACEMENT Right 09/21/2015   Procedure: CYSTOSCOPY WITH STENT REPLACEMENT;  Surgeon: Vanna Scotland, MD;  Location: [facility];  Service: Urology;  Laterality: Right;  . CYSTOSCOPY W/ URETERAL STENT REMOVAL Right 02/10/2015   Procedure: CYSTOSCOPY WITH STENT REMOVAL;  Surgeon: Lorraine Lax, MD;  Location: [facility];  Service: Urology;  Laterality: Right;  . CYSTOSCOPY WITH STENT  PLACEMENT Right 01/29/2015   Procedure: CYSTOSCOPY WITH STENT PLACEMENT;  Surgeon: Crist Fat, MD;  Location: [facility];  Service: Urology;  Laterality: Right;  . CYSTOSCOPY WITH STENT PLACEMENT Right 02/10/2015   Procedure: CYSTOSCOPY WITH STENT PLACEMENT;  Surgeon: Lorraine Lax, MD;  Location: [facility];  Service: Urology;  Laterality: Right;  . CYSTOSCOPY WITH STENT PLACEMENT Right 09/07/2015   Procedure: CYSTOSCOPY WITH STENT PLACEMENT;  Surgeon: Crist Fat, MD;  Location: [facility];  Service: Urology;  Laterality: Right;  . CYSTOSCOPY/RETROGRADE/URETEROSCOPY/STONE EXTRACTION WITH BASKET  05/26/2015   Procedure: CYSTOSCOPY/RETROGRADE/URETEROSCOPY/STONE EXTRACTION WITH BASKET;  Surgeon: Vanna Scotland, MD;  Location: [facility];  Service: Urology;;  . CYSTOSCOPY/URETEROSCOPY/HOLMIUM LASER  05/26/2015   Procedure: CYSTOSCOPY/URETEROSCOPY/HOLMIUM LASER;  Surgeon: Vanna Scotland, MD;  Location: [facility];  Service: Urology;;  . EXTRACORPOREAL SHOCK WAVE LITHOTRIPSY Right 04/22/2015   Procedure: EXTRACORPOREAL SHOCK WAVE LITHOTRIPSY (ESWL);  Surgeon: Hildred Laser, MD;  Location: [facility];  Service: Urology;  Laterality: Right;  . OOPHORECTOMY Right 1996   Ovarian cyst  . URETEROSCOPY WITH HOLMIUM LASER LITHOTRIPSY Right 02/10/2015   Procedure: URETEROSCOPY WITH HOLMIUM LASER LITHOTRIPSY;  Surgeon: Lorraine Lax, MD;  Location: [facility];  Service: Urology;  Laterality: Right;  . URETEROSCOPY WITH HOLMIUM LASER LITHOTRIPSY Right 09/21/2015   Procedure: URETEROSCOPY WITH HOLMIUM LASER LITHOTRIPSY/ fragmentation and removal;  Surgeon: Vanna Scotland, MD;  Location: [facility];  Service: Urology;  Laterality: Right;    Prior to Admission medications   Medication Sig Start Date End Date Taking? Authorizing Provider  clindamycin (CLEOCIN) 150 MG capsule Take 3 capsules (450 mg total) by mouth 3 (  three) times daily. 06/09/16 06/19/16  Enid DerryAshley Tellis Spivak, PA-C  ibuprofen (ADVIL,MOTRIN) 400 MG tablet  Take 800 mg by mouth every 6 (six) hours as needed for headache or mild pain.     Historical Provider, MD  traMADol (ULTRAM) 50 MG tablet Take 1 tablet (50 mg total) by mouth every 6 (six) hours as needed. 06/09/16 06/09/17  Enid DerryAshley Kataleia Quaranta, PA-C    Allergies Penicillins  Family History  Problem Relation Age of Onset  . Nephrolithiasis Paternal Grandfather     Social History Social History  Substance Use Topics  . Smoking status: Current Every Day Smoker    Packs/day: 1.00    Years: 8.00    Types: Cigarettes  . Smokeless tobacco: Never Used  . Alcohol use Yes     Comment: rare     Review of Systems  Constitutional: No fever/chills ENT: No upper respiratory complaints. Cardiovascular: No chest pain. Respiratory: No cough. No SOB. Gastrointestinal: No abdominal pain.  No nausea, no vomiting.  Skin: Negative for rash, abrasions, lacerations, ecchymosis. Neurological: Negative for headaches.   ____________________________________________   PHYSICAL EXAM:  VITAL SIGNS: ED Triage Vitals  Enc Vitals Group     BP 06/09/16 2110 (!) 150/91     Pulse Rate 06/09/16 2110 90     Resp 06/09/16 2110 16     Temp 06/09/16 2110 98.3 F (36.8 C)     Temp Source 06/09/16 2110 Oral     SpO2 06/09/16 2110 98 %     Weight 06/09/16 2119 247 lb (112 kg)     Height 06/09/16 2119 5\' 3"  (1.6 m)     Head Circumference --      Peak Flow --      Pain Score 06/09/16 2119 7     Pain Loc --      Pain Edu? --      Excl. in GC? --      Constitutional: Alert and oriented. Well appearing and in no acute distress. Eyes: Conjunctivae are normal. PERRL. EOMI. Head: Atraumatic. ENT:      Ears:Tympanic membrane is pearly gray with good landmarks.      Nose: No congestion/rhinnorhea.      Mouth/Throat: Mucous membranes are moist. Multiple cavities and broken teeth. Upper back left molar is almost completely decayed. Swelling over left side of cheek. No fluctuant areas or areas of induration over  mucosal surfaces or buccal surfaces. Uvula midline. Neck: No stridor.  No cervical spine tenderness to palpation. No TMJ pain. Hematological/Lymphatic/Immunilogical: No cervical lymphadenopathy. Cardiovascular: Normal rate, regular rhythm. Normal S1 and S2.  Good peripheral circulation. Respiratory: Normal respiratory effort without tachypnea or retractions. Lungs CTAB. Good air entry to the bases with no decreased or absent breath sounds. Musculoskeletal: Full range of motion to all extremities. No gross deformities appreciated. Neurologic:  Normal speech and language. No gross focal neurologic deficits are appreciated.  Skin:  Skin is warm, dry and intact. No rash noted. Psychiatric: Mood and affect are normal. Speech and behavior are normal. Patient exhibits appropriate insight and judgement.   ____________________________________________   LABS (all labs ordered are listed, but only abnormal results are displayed)  Labs Reviewed - No data to display ____________________________________________  EKG   ____________________________________________  RADIOLOGY  No results found.  ____________________________________________    PROCEDURES  Procedure(s) performed:    Procedures    Medications - No data to display   ____________________________________________   INITIAL IMPRESSION / ASSESSMENT AND PLAN / ED COURSE  Pertinent labs &  imaging results that were available during my care of the patient were reviewed by me and considered in my medical decision making (see chart for details).  Review of the [place] CSRS was performed in accordance of the [facility] prior to dispensing any controlled drugs.  Clinical Course     Patient's diagnosis is consistent with tooth infection. Patient will be discharged home with prescriptions for Clindamycin. Patient is to follow up with dentist. Patient is given ED precautions to return to the ED for any worsening or new  symptoms. ____________________________________________  FINAL CLINICAL IMPRESSION(S) / ED DIAGNOSES  Final diagnoses:  Dental infection      NEW MEDICATIONS STARTED DURING THIS VISIT:  New Prescriptions   CLINDAMYCIN (CLEOCIN) 150 MG CAPSULE    Take 3 capsules (450 mg total) by mouth 3 (three) times daily.   TRAMADOL (ULTRAM) 50 MG TABLET    Take 1 tablet (50 mg total) by mouth every 6 (six) hours as needed.        This chart was dictated using voice recognition software/Dragon. Despite best efforts to proofread, errors can occur which can change the meaning. Any change was purely unintentional.    Enid Derryshley Abuk Selleck, PA-C 06/09/16 2204    Jennye MoccasinBrian S Quigley, MD 06/09/16 [phone]

## 2016-06-09 NOTE — ED Triage Notes (Addendum)
Pt presents to ED with c/o pain to the top left side of her mouth. Pt states she thinks one of her teeth may have abscessed. Pt reports swelling; denies drainage.

## 2016-06-09 NOTE — ED Notes (Signed)
Left sided upper toothache, worsening today. Pt alert and oriented X4, active, cooperative, pt in NAD. RR even and unlabored, color WNL.  Ambulatory back to room.

## 2016-09-05 ENCOUNTER — Emergency Department
Admission: EM | Admit: 2016-09-05 | Discharge: 2016-09-05 | Disposition: A | Discharge status: Home / Self Care | Payer: Managed Care, Other (non HMO) | Attending: Emergency Medicine | Admitting: Emergency Medicine

## 2016-09-05 ENCOUNTER — Encounter: Payer: Self-pay | Admitting: Medical Oncology

## 2016-09-05 DIAGNOSIS — Z79899 Other long term (current) drug therapy: Secondary | ICD-10-CM | POA: Insufficient documentation

## 2016-09-05 DIAGNOSIS — J40 Bronchitis, not specified as acute or chronic: Secondary | ICD-10-CM | POA: Insufficient documentation

## 2016-09-05 DIAGNOSIS — N189 Chronic kidney disease, unspecified: Secondary | ICD-10-CM | POA: Insufficient documentation

## 2016-09-05 DIAGNOSIS — J069 Acute upper respiratory infection, unspecified: Secondary | ICD-10-CM | POA: Diagnosis not present

## 2016-09-05 DIAGNOSIS — R0981 Nasal congestion: Secondary | ICD-10-CM | POA: Diagnosis present

## 2016-09-05 DIAGNOSIS — F17210 Nicotine dependence, cigarettes, uncomplicated: Secondary | ICD-10-CM | POA: Insufficient documentation

## 2016-09-05 MED ORDER — PREDNISONE 50 MG PO TABS: 50.0000 mg | ORAL_TABLET | Freq: Every day | ORAL | 0 refills | Status: DC

## 2016-09-05 MED ORDER — CETIRIZINE HCL 10 MG PO TABS: 10.0000 mg | ORAL_TABLET | Freq: Every day | ORAL | 0 refills | Status: DC

## 2016-09-05 MED ORDER — ALBUTEROL SULFATE HFA 108 (90 BASE) MCG/ACT IN AERS: 2.0000 | INHALATION_SPRAY | RESPIRATORY_TRACT | 0 refills | Status: DC | PRN

## 2016-09-05 MED ORDER — FLUTICASONE PROPIONATE 50 MCG/ACT NA SUSP: 1.0000 | Freq: Two times a day (BID) | NASAL | 0 refills | Status: DC

## 2016-09-05 MED ORDER — PSEUDOEPH-BROMPHEN-DM 30-2-10 MG/5ML PO SYRP
10.0000 mL | ORAL_SOLUTION | Freq: Four times a day (QID) | ORAL | 0 refills | Status: DC | PRN
Start: 2016-09-05 — End: 2019-01-15

## 2016-09-05 NOTE — ED Triage Notes (Signed)
Pt reports body aches and chills that began yesterday.

## 2016-09-05 NOTE — ED Provider Notes (Signed)
[facility] Emergency Department Provider Note  ____________________________________________  Time seen: Approximately 8:14 PM  I have reviewed the triage vital signs and the nursing notes.   HISTORY  Chief Complaint Generalized Body Aches and Chills    HPI Tanya Henderson is a 41 y.o. female who presents emergency Department with 2 days history of feeling flushed, nasal congestion, ear pressure, sore throat, coughing. Patient reports that she has had recent sick contacts and was diagnosed with viral illness and bronchitis. Patient denies any fevers but does endorse chills. No headache, neck pain, chest pain, shortness of breath come down pain, nausea vomiting, diarrhea or constipation. No medications for this complaint prior to arrival.   Past Medical History:  Diagnosis Date  . Anxiety   . Chronic kidney disease    Kidney stones  . GERD (gastroesophageal reflux disease)   . Headache   . Renal calculi   . Vitamin D deficiency     Patient Active Problem List   Diagnosis Date Noted  . Right ureteral stone 02/03/2015  . Hydronephrosis, right 02/03/2015  . Gross hematuria 02/03/2015  . 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 Biliary calculi 12/16/2013  . Clinical depression 12/16/2013  . Gastro-esophageal reflux disease without esophagitis 12/16/2013  . Headache, migraine 12/16/2013  . Unspecified vitamin D deficiency 01/13/2013  . Hidradenitis 01/13/2013    Past Surgical History:  Procedure Laterality Date  . CHOLECYSTECTOMY    . CYSTOSCOPY W/ URETERAL STENT PLACEMENT Right 09/21/2015   Procedure: CYSTOSCOPY WITH STENT REPLACEMENT;  Surgeon: Vanna Scotland, MD;  Location: [facility];  Service: Urology;  Laterality: Right;  . CYSTOSCOPY W/ URETERAL STENT REMOVAL Right 02/10/2015   Procedure: CYSTOSCOPY WITH STENT REMOVAL;  Surgeon: Lorraine Lax, MD;  Location: [facility];  Service: Urology;  Laterality: Right;  . CYSTOSCOPY WITH STENT  PLACEMENT Right 01/29/2015   Procedure: CYSTOSCOPY WITH STENT PLACEMENT;  Surgeon: Crist Fat, MD;  Location: [facility];  Service: Urology;  Laterality: Right;  . CYSTOSCOPY WITH STENT PLACEMENT Right 02/10/2015   Procedure: CYSTOSCOPY WITH STENT PLACEMENT;  Surgeon: Lorraine Lax, MD;  Location: [facility];  Service: Urology;  Laterality: Right;  . CYSTOSCOPY WITH STENT PLACEMENT Right 09/07/2015   Procedure: CYSTOSCOPY WITH STENT PLACEMENT;  Surgeon: Crist Fat, MD;  Location: [facility];  Service: Urology;  Laterality: Right;  . CYSTOSCOPY/RETROGRADE/URETEROSCOPY/STONE EXTRACTION WITH BASKET  05/26/2015   Procedure: CYSTOSCOPY/RETROGRADE/URETEROSCOPY/STONE EXTRACTION WITH BASKET;  Surgeon: Vanna Scotland, MD;  Location: [facility];  Service: Urology;;  . CYSTOSCOPY/URETEROSCOPY/HOLMIUM LASER  05/26/2015   Procedure: CYSTOSCOPY/URETEROSCOPY/HOLMIUM LASER;  Surgeon: Vanna Scotland, MD;  Location: [facility];  Service: Urology;;  . EXTRACORPOREAL SHOCK WAVE LITHOTRIPSY Right 04/22/2015   Procedure: EXTRACORPOREAL SHOCK WAVE LITHOTRIPSY (ESWL);  Surgeon: Hildred Laser, MD;  Location: [facility];  Service: Urology;  Laterality: Right;  . OOPHORECTOMY Right 1996   Ovarian cyst  . URETEROSCOPY WITH HOLMIUM LASER LITHOTRIPSY Right 02/10/2015   Procedure: URETEROSCOPY WITH HOLMIUM LASER LITHOTRIPSY;  Surgeon: Lorraine Lax, MD;  Location: [facility];  Service: Urology;  Laterality: Right;  . URETEROSCOPY WITH HOLMIUM LASER LITHOTRIPSY Right 09/21/2015   Procedure: URETEROSCOPY WITH HOLMIUM LASER LITHOTRIPSY/ fragmentation and removal;  Surgeon: Vanna Scotland, MD;  Location: [facility];  Service: Urology;  Laterality: Right;    Prior to Admission medications   Medication Sig Start Date End Date Taking? Authorizing Provider  albuterol (PROVENTIL HFA;VENTOLIN HFA) 108 (90 Base) MCG/ACT inhaler Inhale 2 puffs into the lungs every 4 (four) hours  as needed for wheezing or shortness of breath. 09/05/16    Delorise Royals Dorean Daniello, PA-C  brompheniramine-pseudoephedrine-DM 30-2-10 MG/5ML syrup Take 10 mLs by mouth 4 (four) times daily as needed. 09/05/16   Delorise Royals Jylan Loeza, PA-C  cetirizine (ZYRTEC) 10 MG tablet Take 1 tablet (10 mg total) by mouth daily. 09/05/16   Delorise Royals Kinta Martis, PA-C  fluticasone (FLONASE) 50 MCG/ACT nasal spray Place 1 spray into both nostrils 2 (two) times daily. 09/05/16   Delorise Royals Sabian Kuba, PA-C  ibuprofen (ADVIL,MOTRIN) 400 MG tablet Take 800 mg by mouth every 6 (six) hours as needed for headache or mild pain.     Historical Provider, MD  predniSONE (DELTASONE) 50 MG tablet Take 1 tablet (50 mg total) by mouth daily with breakfast. 09/05/16   Delorise Royals Vinayak Bobier, PA-C  traMADol (ULTRAM) 50 MG tablet Take 1 tablet (50 mg total) by mouth every 6 (six) hours as needed. 06/09/16 06/09/17  Enid Derry, PA-C    Allergies Clindamycin/lincomycin and Penicillins  Family History  Problem Relation Age of Onset  . Nephrolithiasis Paternal Grandfather     Social History Social History  Substance Use Topics  . Smoking status: Current Every Day Smoker    Packs/day: 1.00    Years: 8.00    Types: Cigarettes  . Smokeless tobacco: Never Used  . Alcohol use Yes     Comment: rare     Review of Systems  Constitutional: No fever/Positive chills Eyes: No visual changes. No discharge ENT: Positive for ear pressure, nasal congestion, sore throat Cardiovascular: no chest pain. Respiratory: Positive cough. No SOB. Gastrointestinal: No abdominal pain.  No nausea, no vomiting.  No diarrhea.  No constipation. Musculoskeletal: Negative for musculoskeletal pain. Skin: Negative for rash, abrasions, lacerations, ecchymosis. Neurological: Negative for headaches, focal weakness or numbness. 10-point ROS otherwise negative.  ____________________________________________   PHYSICAL EXAM:  VITAL SIGNS: ED Triage Vitals  Enc Vitals Group     BP 09/05/16 1857 129/83     Pulse  Rate 09/05/16 1857 92     Resp 09/05/16 1857 18     Temp 09/05/16 1857 98.6 F (37 C)     Temp Source 09/05/16 1857 Oral     SpO2 09/05/16 1857 97 %     Weight 09/05/16 1857 240 lb (108.9 kg)     Height 09/05/16 1857 5\' 3"  (1.6 m)     Head Circumference --      Peak Flow --      Pain Score 09/05/16 1858 5     Pain Loc --      Pain Edu? --      Excl. in GC? --      Constitutional: Alert and oriented. Well appearing and in no acute distress. Eyes: Conjunctivae are normal. PERRL. EOMI. Head: Atraumatic. ENT:      Ears: EACs are unremarkable bilaterally. TMs are bulging bilaterally. No air-fluid level.      Nose: Moderate clear congestion/rhinnorhea.      Mouth/Throat: Mucous membranes are moist.  Neck: No stridor.   Hematological/Lymphatic/Immunilogical: No cervical lymphadenopathy. Cardiovascular: Normal rate, regular rhythm. Normal S1 and S2.  Good peripheral circulation. Respiratory: Normal respiratory effort without tachypnea or retractions. Lungs with a few scattered wheezes to the lower lung field. No rales or rhonchi.Peri [place] air entry to the bases with no decreased or absent breath sounds. Musculoskeletal: Full range of motion to all extremities. No gross deformities appreciated. Neurologic:  Normal speech and language. No gross focal neurologic deficits are appreciated.  Skin:  Skin is warm, dry and intact. No rash noted. Psychiatric: Mood and affect are normal. Speech and behavior are normal. Patient exhibits appropriate insight and judgement.   ____________________________________________   LABS (all labs ordered are listed, but only abnormal results are displayed)  Labs Reviewed - No data to display ____________________________________________  EKG   ____________________________________________  RADIOLOGY   No results found.  ____________________________________________    PROCEDURES  Procedure(s) performed:    Procedures    Medications - No  data to display   ____________________________________________   INITIAL IMPRESSION / ASSESSMENT AND PLAN / ED COURSE  Pertinent labs & imaging results that were available during my care of the patient were reviewed by me and considered in my medical decision making (see chart for details).  Review of the [place] CSRS was performed in accordance of the [facility] prior to dispensing any controlled drugs.     Patient's diagnosis is consistent with viral illness with bronchitis. No indication for labs or imaging at this time.. Patient will be discharged home with prescriptions for prednisone, albuterol, Flonase, Zyrtec, cough medication. Patient is to follow up with primary care as needed or otherwise directed. Patient is given ED precautions to return to the ED for any worsening or new symptoms.     ____________________________________________  FINAL CLINICAL IMPRESSION(S) / ED DIAGNOSES  Final diagnoses:  Viral upper respiratory tract infection  Bronchitis      NEW MEDICATIONS STARTED DURING THIS VISIT:  New Prescriptions   ALBUTEROL (PROVENTIL HFA;VENTOLIN HFA) 108 (90 BASE) MCG/ACT INHALER    Inhale 2 puffs into the lungs every 4 (four) hours as needed for wheezing or shortness of breath.   BROMPHENIRAMINE-PSEUDOEPHEDRINE-DM 30-2-10 MG/5ML SYRUP    Take 10 mLs by mouth 4 (four) times daily as needed.   CETIRIZINE (ZYRTEC) 10 MG TABLET    Take 1 tablet (10 mg total) by mouth daily.   FLUTICASONE (FLONASE) 50 MCG/ACT NASAL SPRAY    Place 1 spray into both nostrils 2 (two) times daily.   PREDNISONE (DELTASONE) 50 MG TABLET    Take 1 tablet (50 mg total) by mouth daily with breakfast.        This chart was dictated using voice recognition software/Dragon. Despite best efforts to proofread, errors can occur which can change the meaning. Any change was purely unintentional.    Racheal PatchesJonathan D Lizzeth Meder, PA-C 09/05/16 2023    Jeanmarie PlantJames A McShane, MD 09/05/16 2258

## 2016-09-08 ENCOUNTER — Emergency Department: Payer: Managed Care, Other (non HMO)

## 2016-09-08 ENCOUNTER — Emergency Department
Admission: EM | Admit: 2016-09-08 | Discharge: 2016-09-08 | Disposition: A | Discharge status: Home / Self Care | Payer: Managed Care, Other (non HMO) | Attending: Emergency Medicine | Admitting: Emergency Medicine

## 2016-09-08 DIAGNOSIS — N189 Chronic kidney disease, unspecified: Secondary | ICD-10-CM | POA: Insufficient documentation

## 2016-09-08 DIAGNOSIS — J069 Acute upper respiratory infection, unspecified: Secondary | ICD-10-CM | POA: Diagnosis not present

## 2016-09-08 DIAGNOSIS — R6889 Other general symptoms and signs: Secondary | ICD-10-CM

## 2016-09-08 DIAGNOSIS — Z79899 Other long term (current) drug therapy: Secondary | ICD-10-CM | POA: Insufficient documentation

## 2016-09-08 DIAGNOSIS — R05 Cough: Secondary | ICD-10-CM | POA: Diagnosis present

## 2016-09-08 DIAGNOSIS — F17210 Nicotine dependence, cigarettes, uncomplicated: Secondary | ICD-10-CM | POA: Diagnosis not present

## 2016-09-08 DIAGNOSIS — B9789 Other viral agents as the cause of diseases classified elsewhere: Secondary | ICD-10-CM

## 2016-09-08 NOTE — ED Notes (Signed)
Patient transported to X-ray 

## 2016-09-08 NOTE — ED Provider Notes (Signed)
[facility] Emergency Department Provider Note  ____________________________________________   First MD Initiated Contact with Patient 09/08/16 [phone]     (approximate)  I have reviewed the triage vital signs and the nursing notes.   HISTORY  Chief Complaint Chills    HPI Tanya Henderson is a 41 y.o. female who presents for evaluation of URIsymptoms that have been present for about 4-5 days.  She reports general malaise, nasal congestion, cough, occasional chills, and pressure in her head.  She describes symptoms as severe.  She was diagnosed with bronchitis in the emergency department 3 days ago and then followed up 2 days ago with her primary care doctor for additional evaluation.  He agreed with the diagnosis of viral URI and wrote her a prescription for cough medication.  She states that she has not gotten that filled because she is afraid she will overdose.  She came back tonight because she is concerned that she needs antibiotics.  Nothing is making her symptoms better and she feels like she might be getting worse.  She denies wheezing, nausea, vomiting, abdominal pain, chest pain, fever that she has measured, diarrhea, dysuria.   Past Medical History:  Diagnosis Date  . Anxiety   . Chronic kidney disease    Kidney stones  . GERD (gastroesophageal reflux disease)   . Headache   . Renal calculi   . Vitamin D deficiency     Patient Active Problem List   Diagnosis Date Noted  . Right ureteral stone 02/03/2015  . Hydronephrosis, right 02/03/2015  . Gross hematuria 02/03/2015  . 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 Biliary calculi 12/16/2013  . Clinical depression 12/16/2013  . Gastro-esophageal reflux disease without esophagitis 12/16/2013  . Headache, migraine 12/16/2013  . Unspecified vitamin D deficiency 01/13/2013  . Hidradenitis 01/13/2013    Past Surgical History:  Procedure Laterality Date  . CHOLECYSTECTOMY    .  CYSTOSCOPY W/ URETERAL STENT PLACEMENT Right 09/21/2015   Procedure: CYSTOSCOPY WITH STENT REPLACEMENT;  Surgeon: Vanna ScotlandAshley Brandon, MD;  Location: [facility];  Service: Urology;  Laterality: Right;  . CYSTOSCOPY W/ URETERAL STENT REMOVAL Right 02/10/2015   Procedure: CYSTOSCOPY WITH STENT REMOVAL;  Surgeon: Lorraine Laxichard D Hart, MD;  Location: [facility];  Service: Urology;  Laterality: Right;  . CYSTOSCOPY WITH STENT PLACEMENT Right 01/29/2015   Procedure: CYSTOSCOPY WITH STENT PLACEMENT;  Surgeon: Crist FatBenjamin W Herrick, MD;  Location: [facility];  Service: Urology;  Laterality: Right;  . CYSTOSCOPY WITH STENT PLACEMENT Right 02/10/2015   Procedure: CYSTOSCOPY WITH STENT PLACEMENT;  Surgeon: Lorraine Laxichard D Hart, MD;  Location: [facility];  Service: Urology;  Laterality: Right;  . CYSTOSCOPY WITH STENT PLACEMENT Right 09/07/2015   Procedure: CYSTOSCOPY WITH STENT PLACEMENT;  Surgeon: Crist FatBenjamin W Herrick, MD;  Location: [facility];  Service: Urology;  Laterality: Right;  . CYSTOSCOPY/RETROGRADE/URETEROSCOPY/STONE EXTRACTION WITH BASKET  05/26/2015   Procedure: CYSTOSCOPY/RETROGRADE/URETEROSCOPY/STONE EXTRACTION WITH BASKET;  Surgeon: Vanna ScotlandAshley Brandon, MD;  Location: [facility];  Service: Urology;;  . CYSTOSCOPY/URETEROSCOPY/HOLMIUM LASER  05/26/2015   Procedure: CYSTOSCOPY/URETEROSCOPY/HOLMIUM LASER;  Surgeon: Vanna ScotlandAshley Brandon, MD;  Location: [facility];  Service: Urology;;  . EXTRACORPOREAL SHOCK WAVE LITHOTRIPSY Right 04/22/2015   Procedure: EXTRACORPOREAL SHOCK WAVE LITHOTRIPSY (ESWL);  Surgeon: Hildred LaserBrian James Budzyn, MD;  Location: [facility];  Service: Urology;  Laterality: Right;  . OOPHORECTOMY Right 1996   Ovarian cyst  . URETEROSCOPY WITH HOLMIUM LASER LITHOTRIPSY Right 02/10/2015   Procedure: URETEROSCOPY WITH HOLMIUM LASER LITHOTRIPSY;  Surgeon: Lorraine Laxichard D Hart, MD;  Location:  [facility];  Service: Urology;  Laterality: Right;  . URETEROSCOPY WITH HOLMIUM LASER LITHOTRIPSY Right 09/21/2015   Procedure: URETEROSCOPY WITH HOLMIUM LASER  LITHOTRIPSY/ fragmentation and removal;  Surgeon: Vanna Scotland, MD;  Location: [facility];  Service: Urology;  Laterality: Right;    Prior to Admission medications   Medication Sig Start Date End Date Taking? Authorizing Provider  albuterol (PROVENTIL HFA;VENTOLIN HFA) 108 (90 Base) MCG/ACT inhaler Inhale 2 puffs into the lungs every 4 (four) hours as needed for wheezing or shortness of breath. 09/05/16   Delorise Royals Cuthriell, PA-C  brompheniramine-pseudoephedrine-DM 30-2-10 MG/5ML syrup Take 10 mLs by mouth 4 (four) times daily as needed. 09/05/16   Delorise Royals Cuthriell, PA-C  cetirizine (ZYRTEC) 10 MG tablet Take 1 tablet (10 mg total) by mouth daily. 09/05/16   Delorise Royals Cuthriell, PA-C  fluticasone (FLONASE) 50 MCG/ACT nasal spray Place 1 spray into both nostrils 2 (two) times daily. 09/05/16   Delorise Royals Cuthriell, PA-C  ibuprofen (ADVIL,MOTRIN) 400 MG tablet Take 800 mg by mouth every 6 (six) hours as needed for headache or mild pain.     Historical Provider, MD  predniSONE (DELTASONE) 50 MG tablet Take 1 tablet (50 mg total) by mouth daily with breakfast. 09/05/16   Delorise Royals Cuthriell, PA-C  traMADol (ULTRAM) 50 MG tablet Take 1 tablet (50 mg total) by mouth every 6 (six) hours as needed. 06/09/16 06/09/17  Enid Derry, PA-C    Allergies Clindamycin/lincomycin and Penicillins  Family History  Problem Relation Age of Onset  . Nephrolithiasis Paternal Grandfather     Social History Social History  Substance Use Topics  . Smoking status: Current Every Day Smoker    Packs/day: 1.00    Years: 8.00    Types: Cigarettes  . Smokeless tobacco: Never Used  . Alcohol use Yes     Comment: rare    Review of Systems Constitutional: +chills, general malaise, body aches Eyes: No visual changes. ENT: Mild sore throat.  Nasal congestion. Cardiovascular: Denies chest pain. Respiratory: +shortness of breath, +cough Gastrointestinal: No abdominal pain.  No nausea, no vomiting.  No diarrhea.   No constipation. Genitourinary: Negative for dysuria. Musculoskeletal: Negative for back pain. Skin: Negative for rash. Neurological: Negative for headaches, focal weakness or numbness.  10-point ROS otherwise negative.  ____________________________________________   PHYSICAL EXAM:  VITAL SIGNS: ED Triage Vitals [09/08/16 0343]  Enc Vitals Group     BP 119/75     Pulse Rate 88     Resp 18     Temp 98.2 F (36.8 C)     Temp Source Oral     SpO2 97 %     Weight 240 lb (108.9 kg)     Height 5\' 3"  (1.6 m)     Head Circumference      Peak Flow      Pain Score 6     Pain Loc      Pain Edu?      Excl. in GC?     Constitutional: Alert and oriented. Well appearing and in no acute distress. Eyes: Conjunctivae are normal. PERRL. EOMI. Head: Atraumatic. Nose: +congestion Mouth/Throat: Mucous membranes are moist. Neck: No stridor.  No meningeal signs.   Cardiovascular: Normal rate, regular rhythm. Good peripheral circulation. Grossly normal heart sounds. Respiratory: Normal respiratory effort.  No retractions. Lungs CTAB. Gastrointestinal: Soft and nontender. No distention.  Musculoskeletal: No lower extremity tenderness nor edema. No gross deformities of extremities. Neurologic:  Normal speech and language. No gross focal  neurologic deficits are appreciated.  Skin:  Skin is warm, dry and intact. No rash noted. Psychiatric: Mood and affect are normal. Speech and behavior are normal.  ____________________________________________   LABS (all labs ordered are listed, but only abnormal results are displayed)  Labs Reviewed - No data to display ____________________________________________  EKG  None - EKG not ordered by ED physician ____________________________________________  RADIOLOGY   Dg Chest 2 View  Result Date: 09/08/2016 CLINICAL DATA:  41 year old female with cough. EXAM: CHEST  2 VIEW COMPARISON:  Chest radiograph dated 03/15/2005 FINDINGS: The lungs are  clear. There is no pleural effusion or pneumothorax. The cardiac silhouette is within normal limits. No acute osseous pathology identified. IMPRESSION: No active cardiopulmonary disease. Electronically Signed   By: Elgie Collard M.D.   On: 09/08/2016 04:29    ____________________________________________   PROCEDURES  Critical Care performed: No   Procedure(s) performed:   Procedures   ____________________________________________   INITIAL IMPRESSION / ASSESSMENT AND PLAN / ED COURSE  Pertinent labs & imaging results that were available during my care of the patient were reviewed by me and considered in my medical decision making (see chart for details).  Since I am the third provider to see her in the last 3 days, I obtained a chest x-ray to rule out pneumonia and it was negative.  I reviewed the note from the patient's primary care provider in which she specifically told her that she can expect her symptoms to get worse before they get better and that they would likely last 10 days to as much as 3 weeks.  I reiterated this to her and encouraged her to take the medication she was prescribed which may help improve some of her symptoms.  I explained to her that it is always possible she may have influenza B which is prevalent in the community at this time, but there is no benefit to testing for it because she has had symptoms for a long period of time and Tamiflu is unlikely to help anyway.  I gave my usual and customary return precautions.  She understands the plan.      ____________________________________________  FINAL CLINICAL IMPRESSION(S) / ED DIAGNOSES  Final diagnoses:  Viral URI with cough  Flu-like symptoms     MEDICATIONS GIVEN DURING THIS VISIT:  Medications - No data to display   NEW OUTPATIENT MEDICATIONS STARTED DURING THIS VISIT:  New Prescriptions   No medications on file    Modified Medications   No medications on file    Discontinued  Medications   No medications on file     Note:  This document was prepared using Dragon voice recognition software and may include unintentional dictation errors.    Loleta Rose, MD 09/08/16 [phone]

## 2016-09-08 NOTE — ED Notes (Signed)
Reviewed d/c instructions, follow-up care, medications prescribed from prior visit to this ED and PCP, and OTC pain medications with patient. Patient verbalized understanding.

## 2016-09-08 NOTE — ED Triage Notes (Signed)
Pt here for cold symptoms, cough, congestion, chills, and head pressure. Was here Wednesday and dx with bronchitis and given an inhaler. Pt states symptoms are not better, wants to be rechecked.

## 2016-09-08 NOTE — ED Notes (Signed)
Patient c/o SOB, generalized pain, productive cough, ,  nasal congestion/drainage beginning Wednesday. Pain c/o nausea beginning Thursday evening.  [facility] ED and diagnosed with bronchitis and prescribed prednisone and an inhaler. Pt denies relief of symptoms. Pt reports that she no labs/xrays were performed on prior exam. Pt worried about possibility of Pneumonia, and asking if she needs to be prescribed antibiotics.

## 2016-09-08 NOTE — ED Notes (Signed)
MD Forbach and RN at bedside. 

## 2016-09-08 NOTE — Discharge Instructions (Signed)

## 2016-09-08 NOTE — ED Notes (Signed)
Patient reports she has not taken cough medicine prescribed by PCP because she is scared of becoming addicted to Codeine.

## 2017-01-25 ENCOUNTER — Other Ambulatory Visit: Payer: Self-pay | Admitting: Obstetrics and Gynecology

## 2017-01-25 DIAGNOSIS — Z1231 Encounter for screening mammogram for malignant neoplasm of breast: Secondary | ICD-10-CM

## 2017-02-04 IMAGING — CR DG ABDOMEN 1V
1 series · 2 of 2 positions shown · non-contrast
Comparison: 09/06/2015

CLINICAL DATA: History of kidney stones and flank pain

EXAM:
ABDOMEN - 1 VIEW

[Series 1: dg abd 1 view · 0.14mm/px · 2 of 2 slices shown]
[im 1/2]
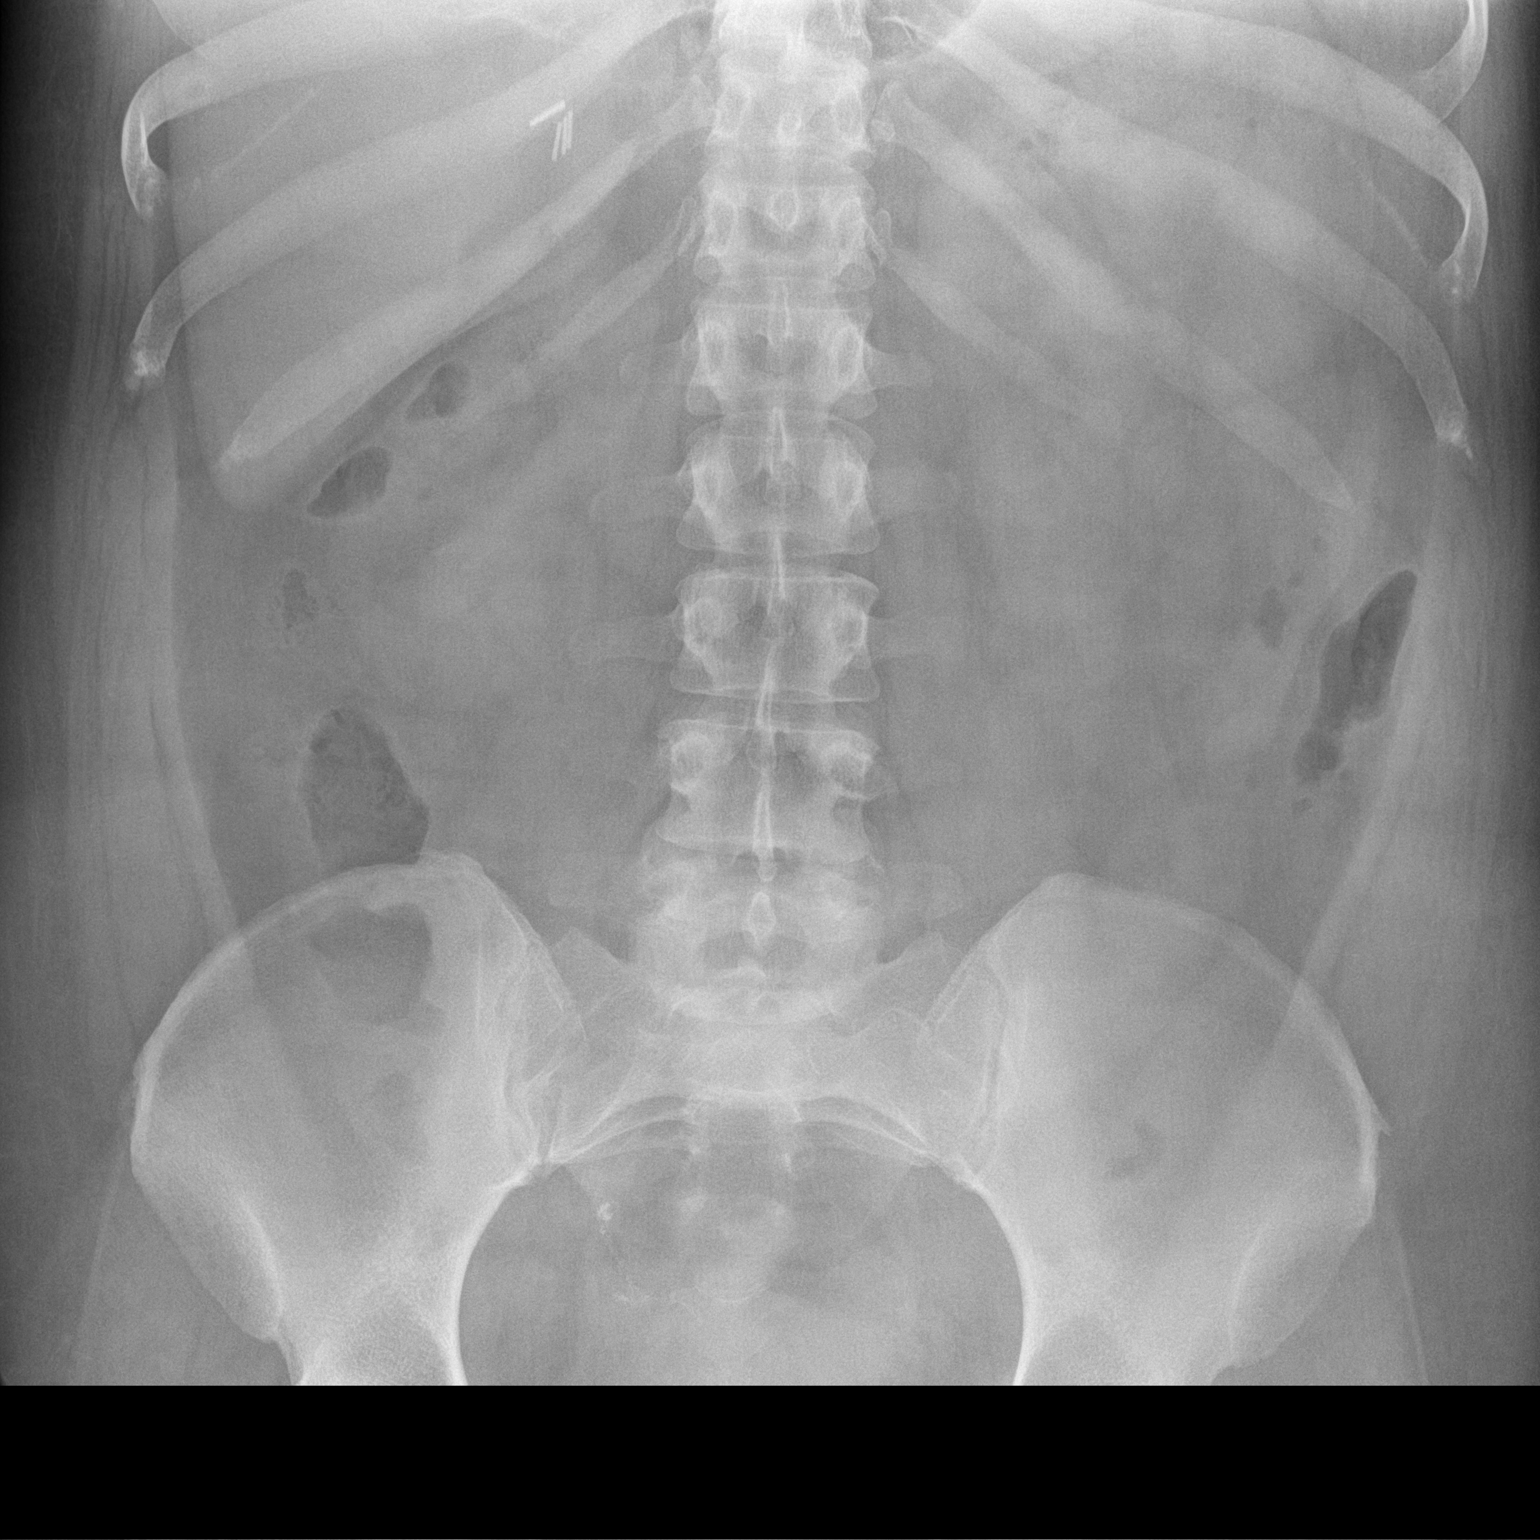
[im 2/2]
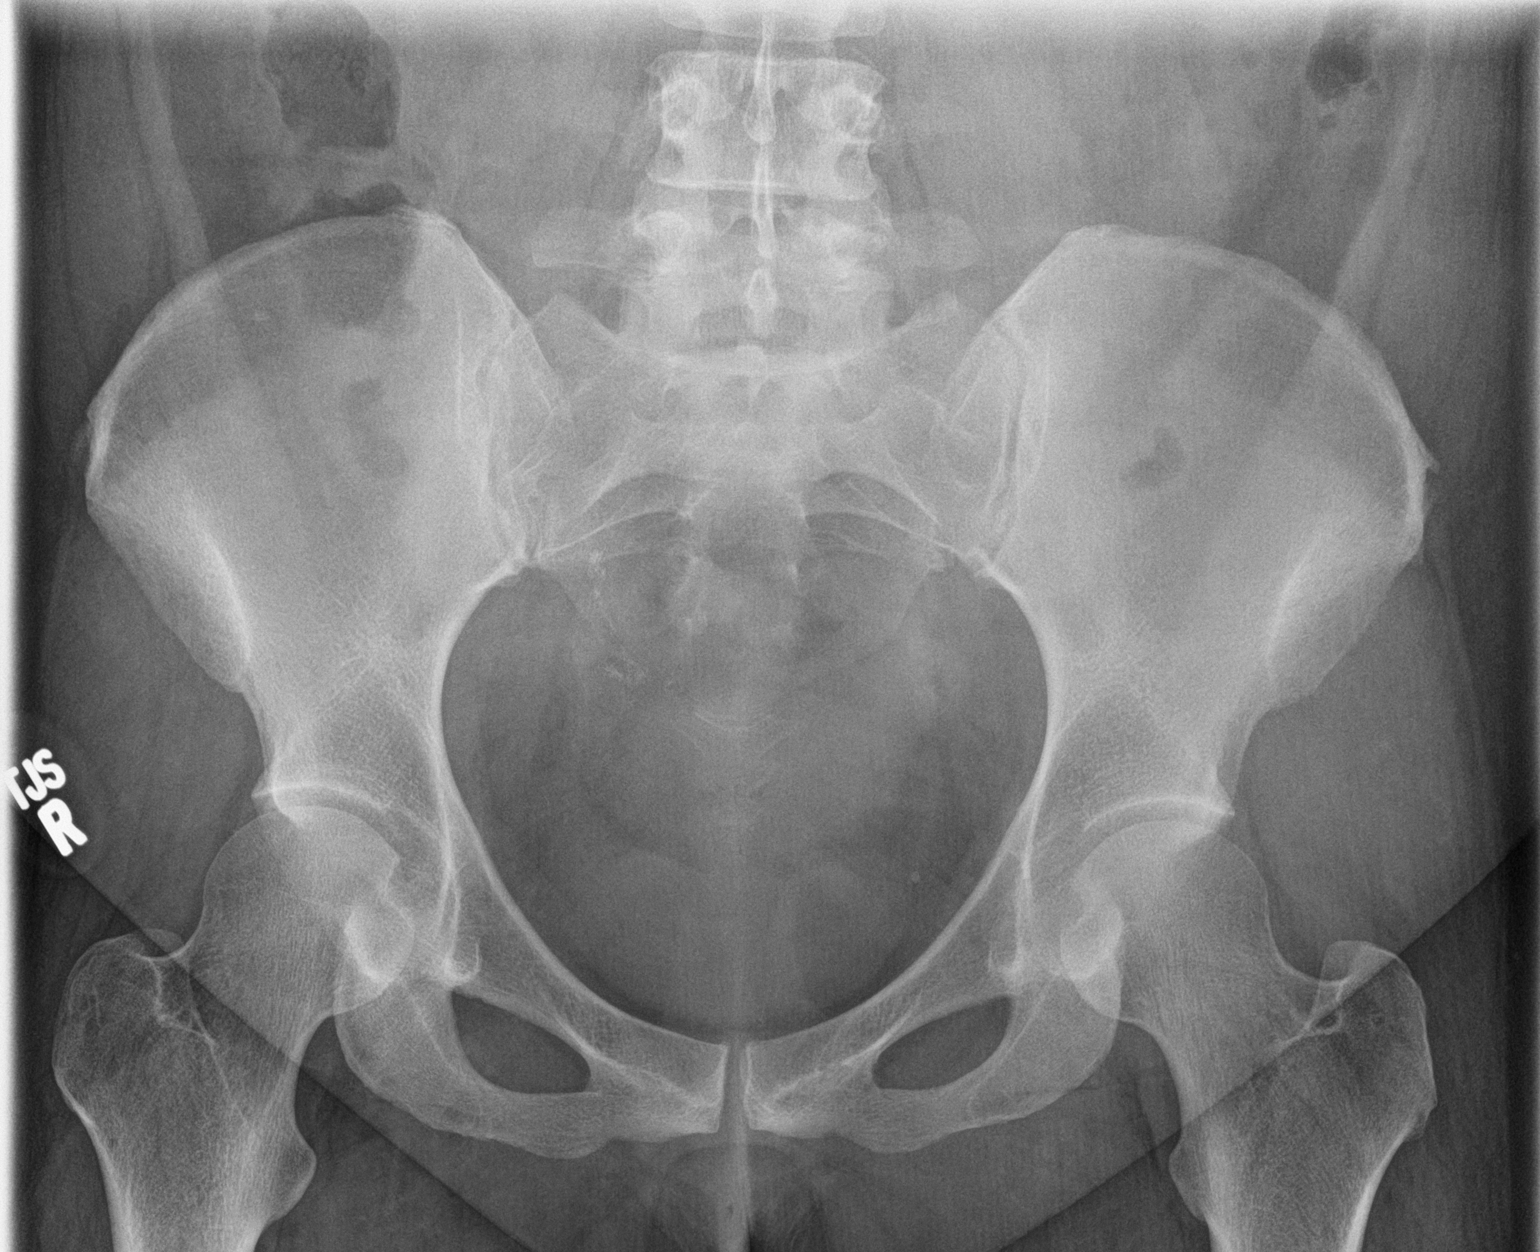

[2 of 2 positions shown; findings below may reference images not displayed]

FINDINGS: Scattered large and small bowel gas is noted. No abnormal mass or
abnormal calcifications are seen. No bony abnormality is noted.
IMPRESSION: No acute abnormality seen.

## 2017-02-07 ENCOUNTER — Ambulatory Visit
Admission: RE | Admit: 2017-02-07 | Discharge: 2017-02-07 | Disposition: A | Discharge status: Home / Self Care | Payer: Managed Care, Other (non HMO) | Source: Ambulatory Visit | Attending: Obstetrics and Gynecology | Admitting: Obstetrics and Gynecology

## 2017-02-07 DIAGNOSIS — Z1231 Encounter for screening mammogram for malignant neoplasm of breast: Secondary | ICD-10-CM | POA: Diagnosis not present

## 2017-07-23 ENCOUNTER — Other Ambulatory Visit: Payer: Self-pay | Admitting: Family Medicine

## 2017-07-23 DIAGNOSIS — R740 Nonspecific elevation of levels of transaminase and lactic acid dehydrogenase [LDH]: Principal | ICD-10-CM

## 2017-07-23 DIAGNOSIS — R7401 Elevation of levels of liver transaminase levels: Secondary | ICD-10-CM

## 2017-08-01 ENCOUNTER — Ambulatory Visit
Admission: RE | Admit: 2017-08-01 | Discharge: 2017-08-01 | Disposition: A | Discharge status: Home / Self Care | Payer: 59 | Source: Ambulatory Visit | Attending: Family Medicine | Admitting: Family Medicine

## 2017-08-01 DIAGNOSIS — R740 Nonspecific elevation of levels of transaminase and lactic acid dehydrogenase [LDH]: Secondary | ICD-10-CM | POA: Diagnosis present

## 2017-08-01 DIAGNOSIS — R7401 Elevation of levels of liver transaminase levels: Secondary | ICD-10-CM

## 2017-09-03 ENCOUNTER — Encounter: Payer: Self-pay | Admitting: Emergency Medicine

## 2017-09-03 ENCOUNTER — Other Ambulatory Visit: Payer: Self-pay

## 2017-09-03 ENCOUNTER — Emergency Department
Admission: EM | Admit: 2017-09-03 | Discharge: 2017-09-03 | Disposition: A | Discharge status: Home / Self Care | Payer: Managed Care, Other (non HMO) | Attending: Emergency Medicine | Admitting: Emergency Medicine

## 2017-09-03 DIAGNOSIS — Z79899 Other long term (current) drug therapy: Secondary | ICD-10-CM | POA: Insufficient documentation

## 2017-09-03 DIAGNOSIS — H00026 Hordeolum internum left eye, unspecified eyelid: Secondary | ICD-10-CM | POA: Insufficient documentation

## 2017-09-03 DIAGNOSIS — N189 Chronic kidney disease, unspecified: Secondary | ICD-10-CM | POA: Diagnosis not present

## 2017-09-03 DIAGNOSIS — H02844 Edema of left upper eyelid: Secondary | ICD-10-CM | POA: Diagnosis present

## 2017-09-03 DIAGNOSIS — H01004 Unspecified blepharitis left upper eyelid: Secondary | ICD-10-CM | POA: Diagnosis not present

## 2017-09-03 DIAGNOSIS — F17210 Nicotine dependence, cigarettes, uncomplicated: Secondary | ICD-10-CM | POA: Diagnosis not present

## 2017-09-03 MED ORDER — GENTAMICIN SULFATE 0.3 % OP SOLN: 1.0000 [drp] | Freq: Four times a day (QID) | OPHTHALMIC | 0 refills | Status: DC

## 2017-09-03 MED ORDER — HYDROXYZINE HCL 50 MG PO TABS: 50.0000 mg | ORAL_TABLET | Freq: Three times a day (TID) | ORAL | 0 refills | Status: DC | PRN

## 2017-09-03 NOTE — Discharge Instructions (Signed)
Follow discharge care instruction system warm compresses.  Use eyedrops as directed.

## 2017-09-03 NOTE — ED Triage Notes (Signed)
R periorbital edema noted yesterday. Tender. Feels like pressure. Denies injury to eyelids.

## 2017-09-03 NOTE — ED Provider Notes (Signed)
[facility] Emergency Department Provider Note   ____________________________________________   First MD Initiated Contact with Patient 09/03/17 1422     (approximate)  I have reviewed the triage vital signs and the nursing notes.   HISTORY  Chief Complaint Eye Problem    HPI Tanya Henderson is a 42 y.o. female patient complain right swollen left upper eyelid which started yesterday.  Patient state is tender to touch.  Patient denies vision disturbance.  Patient denies provocative incident.  Patient feeling pressure on the inside of her eyelid.  Patient states she went to optometrist today and he could not tell her what was wrong.  Past Medical History:  Diagnosis Date  . Anxiety   . Chronic kidney disease    Kidney stones  . GERD (gastroesophageal reflux disease)   . Headache   . Renal calculi   . Vitamin D deficiency     Patient Active Problem List   Diagnosis Date Noted  . Right ureteral stone 02/03/2015  . Hydronephrosis, right 02/03/2015  . Gross hematuria 02/03/2015  . 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 Biliary calculi 12/16/2013  . Clinical depression 12/16/2013  . Gastro-esophageal reflux disease without esophagitis 12/16/2013  . Headache, migraine 12/16/2013  . Unspecified vitamin D deficiency 01/13/2013  . Hidradenitis 01/13/2013    Past Surgical History:  Procedure Laterality Date  . CHOLECYSTECTOMY    . CYSTOSCOPY W/ URETERAL STENT PLACEMENT Right 09/21/2015   Procedure: CYSTOSCOPY WITH STENT REPLACEMENT;  Surgeon: Vanna ScotlandAshley Brandon, MD;  Location: [facility];  Service: Urology;  Laterality: Right;  . CYSTOSCOPY W/ URETERAL STENT REMOVAL Right 02/10/2015   Procedure: CYSTOSCOPY WITH STENT REMOVAL;  Surgeon: Lorraine Laxichard D Hart, MD;  Location: [facility];  Service: Urology;  Laterality: Right;  . CYSTOSCOPY WITH STENT PLACEMENT Right 01/29/2015   Procedure: CYSTOSCOPY WITH STENT PLACEMENT;  Surgeon: Crist FatBenjamin W Herrick,  MD;  Location: [facility];  Service: Urology;  Laterality: Right;  . CYSTOSCOPY WITH STENT PLACEMENT Right 02/10/2015   Procedure: CYSTOSCOPY WITH STENT PLACEMENT;  Surgeon: Lorraine Laxichard D Hart, MD;  Location: [facility];  Service: Urology;  Laterality: Right;  . CYSTOSCOPY WITH STENT PLACEMENT Right 09/07/2015   Procedure: CYSTOSCOPY WITH STENT PLACEMENT;  Surgeon: Crist FatBenjamin W Herrick, MD;  Location: [facility];  Service: Urology;  Laterality: Right;  . CYSTOSCOPY/RETROGRADE/URETEROSCOPY/STONE EXTRACTION WITH BASKET  05/26/2015   Procedure: CYSTOSCOPY/RETROGRADE/URETEROSCOPY/STONE EXTRACTION WITH BASKET;  Surgeon: Vanna ScotlandAshley Brandon, MD;  Location: [facility];  Service: Urology;;  . CYSTOSCOPY/URETEROSCOPY/HOLMIUM LASER  05/26/2015   Procedure: CYSTOSCOPY/URETEROSCOPY/HOLMIUM LASER;  Surgeon: Vanna ScotlandAshley Brandon, MD;  Location: [facility];  Service: Urology;;  . EXTRACORPOREAL SHOCK WAVE LITHOTRIPSY Right 04/22/2015   Procedure: EXTRACORPOREAL SHOCK WAVE LITHOTRIPSY (ESWL);  Surgeon: Hildred LaserBrian James Budzyn, MD;  Location: [facility];  Service: Urology;  Laterality: Right;  . OOPHORECTOMY Right 1996   Ovarian cyst  . URETEROSCOPY WITH HOLMIUM LASER LITHOTRIPSY Right 02/10/2015   Procedure: URETEROSCOPY WITH HOLMIUM LASER LITHOTRIPSY;  Surgeon: Lorraine Laxichard D Hart, MD;  Location: [facility];  Service: Urology;  Laterality: Right;  . URETEROSCOPY WITH HOLMIUM LASER LITHOTRIPSY Right 09/21/2015   Procedure: URETEROSCOPY WITH HOLMIUM LASER LITHOTRIPSY/ fragmentation and removal;  Surgeon: Vanna ScotlandAshley Brandon, MD;  Location: [facility];  Service: Urology;  Laterality: Right;    Prior to Admission medications   Medication Sig Start Date End Date Taking? Authorizing Provider  albuterol (PROVENTIL HFA;VENTOLIN HFA) 108 (90 Base) MCG/ACT inhaler Inhale 2 puffs into the lungs every 4 (four) hours as needed for wheezing or shortness of  breath. 09/05/16   Cuthriell, Delorise Royals, PA-C  brompheniramine-pseudoephedrine-DM 30-2-10 MG/5ML syrup Take 10 mLs by mouth  4 (four) times daily as needed. 09/05/16   Cuthriell, Delorise Royals, PA-C  cetirizine (ZYRTEC) 10 MG tablet Take 1 tablet (10 mg total) by mouth daily. 09/05/16   Cuthriell, Delorise Royals, PA-C  fluticasone (FLONASE) 50 MCG/ACT nasal spray Place 1 spray into both nostrils 2 (two) times daily. 09/05/16   Cuthriell, Delorise Royals, PA-C  gentamicin (GARAMYCIN) 0.3 % ophthalmic solution Place 1 drop into the left eye 4 (four) times daily. 09/03/17   Joni Reining, PA-C  hydrOXYzine (ATARAX/VISTARIL) 50 MG tablet Take 1 tablet (50 mg total) by mouth 3 (three) times daily as needed for itching. 09/03/17   Joni Reining, PA-C  ibuprofen (ADVIL,MOTRIN) 400 MG tablet Take 800 mg by mouth every 6 (six) hours as needed for headache or mild pain.     [provider]  predniSONE (DELTASONE) 50 MG tablet Take 1 tablet (50 mg total) by mouth daily with breakfast. 09/05/16   Cuthriell, Delorise Royals, PA-C    Allergies Clindamycin/lincomycin and Penicillins  Family History  Problem Relation Age of Onset  . Nephrolithiasis Paternal Grandfather   . Breast cancer Neg Hx     Social History Social History   Tobacco Use  . Smoking status: Current Every Day Smoker    Packs/day: 1.00    Years: 8.00    Pack years: 8.00    Types: Cigarettes  . Smokeless tobacco: Never Used  Substance Use Topics  . Alcohol use: Yes    Comment: rare  . Drug use: No    Review of Systems Constitutional: No fever/chills Eyes: No visual changes. ENT: No sore throat. Cardiovascular: Denies chest pain. Respiratory: Denies shortness of breath. Gastrointestinal: No abdominal pain.  No nausea, no vomiting.  No diarrhea.  No constipation. Genitourinary: Negative for dysuria. Musculoskeletal: Negative for back pain. Skin: Negative for rash. Neurological: Negative for headaches, focal weakness or numbness. Psychiatric:Anxiety and depression  Allergic/Immunilogical: Clindamycin and  penicillin ____________________________________________   PHYSICAL EXAM:  VITAL SIGNS: ED Triage Vitals  Enc Vitals Group     BP 09/03/17 1327 132/81     Pulse Rate 09/03/17 1327 78     Resp 09/03/17 1327 20     Temp 09/03/17 1327 99.2 F (37.3 C)     Temp Source 09/03/17 1327 Oral     SpO2 09/03/17 1327 98 %     Weight 09/03/17 1328 211 lb (95.7 kg)     Height 09/03/17 1328 5\' 4"  (1.626 m)     Head Circumference --      Peak Flow --      Pain Score 09/03/17 1328 4     Pain Loc --      Pain Edu? --      Excl. in GC? --    Constitutional: Alert and oriented. Well appearing and in no acute distress. Eyes: Conjunctivae are normal. PERRL. EOMI. edematous and erythematous left upper eyelid.  Papular lesion anterior left upper eyelid.   Head: Atraumatic. Nose: No congestion/rhinnorhea. Mouth/Throat: Mucous membranes are moist.  Oropharynx non-erythematous. Neck: No stridor.  No cervical spine tenderness to palpation. Hematological/Lymphatic/Immunilogical: No cervical lymphadenopathy. Cardiovascular: Normal rate, regular rhythm. Grossly normal heart sounds.  Good peripheral circulation. Respiratory: Normal respiratory effort.  No retractions. Lungs CTAB. Gastrointestinal: Soft and nontender. No distention. No abdominal bruits. No CVA tenderness. Musculoskeletal: No lower extremity tenderness nor edema.  No joint effusions. Neurologic:  Normal  speech and language. No gross focal neurologic deficits are appreciated. No gait instability. Skin:  Skin is warm, dry and intact. No rash noted. Psychiatric: Mood and affect are normal. Speech and behavior are normal.  ____________________________________________   LABS (all labs ordered are listed, but only abnormal results are displayed)  Labs Reviewed - No data to display ____________________________________________  EKG   ____________________________________________  RADIOLOGY  ED MD interpretation:    Official radiology  report(s): No results found.  ____________________________________________   PROCEDURES  Procedure(s) performed: None  Procedures  Critical Care performed: No  ____________________________________________   INITIAL IMPRESSION / ASSESSMENT AND PLAN / ED COURSE  As part of my medical decision making, I reviewed the following data within the electronic MEDICAL RECORD NUMBER    Edema erythema left upper eyelid secondary to stye.  Patient given discharge care instruction advised to use eyedrops as directed.  Patient given a work note for 2 days.  Patient advised to follow-up with ophthalmologist if no improvement in 3 days.      ____________________________________________   FINAL CLINICAL IMPRESSION(S) / ED DIAGNOSES  Final diagnoses:  Blepharitis of left upper eyelid, unspecified type  Meibomian blepharitis, left     ED Discharge Orders        Ordered    gentamicin (GARAMYCIN) 0.3 % ophthalmic solution  4 times daily     09/03/17 1436    hydrOXYzine (ATARAX/VISTARIL) 50 MG tablet  3 times daily PRN     09/03/17 1436       Note:  This document was prepared using Dragon voice recognition software and may include unintentional dictation errors.    Joni Reining, PA-C 09/03/17 1447    Jene Every, MD 09/03/17 1459

## 2017-09-03 NOTE — ED Notes (Signed)
Patient presents to the ED with swelling to her right eyelid since yesterday.  Area is painful.  Patient denies any vision problems.  Patient went to the eye doctor prior to coming to the ED and was told if she noticed it getting worse to come to the ED.  Patient states it is getting worse and not better.  Patient denies any other symptoms at this time.

## 2018-11-25 ENCOUNTER — Telehealth: Payer: Self-pay | Admitting: Internal Medicine

## 2018-11-25 NOTE — Telephone Encounter (Signed)
Patient called to make a new patient appointment with [facility].  Patient said she's had an ongoing cough and that's one of the things she wants to discuss with Rollene Fare when she comes in for her appointment.. She said it's not new.  Patient said she prefers to come in to the office. Please advise.

## 2018-11-27 NOTE — Telephone Encounter (Signed)
I would feel more comfortable doing a [email or web address] initial appt given the fact that she has a cough, despite the fact it is a chronic cough.

## 2018-12-05 ENCOUNTER — Ambulatory Visit: Payer: Self-pay | Admitting: Internal Medicine

## 2019-01-14 ENCOUNTER — Other Ambulatory Visit: Payer: Self-pay | Admitting: Family Medicine

## 2019-01-14 ENCOUNTER — Ambulatory Visit
Admission: RE | Admit: 2019-01-14 | Discharge: 2019-01-14 | Disposition: A | Discharge status: Home / Self Care | Payer: PRIVATE HEALTH INSURANCE | Source: Ambulatory Visit | Attending: Family Medicine | Admitting: Family Medicine

## 2019-01-14 DIAGNOSIS — Z1231 Encounter for screening mammogram for malignant neoplasm of breast: Secondary | ICD-10-CM | POA: Diagnosis present

## 2019-01-15 ENCOUNTER — Encounter: Payer: Self-pay | Admitting: Adult Health

## 2019-01-15 ENCOUNTER — Ambulatory Visit: Payer: No Typology Code available for payment source | Admitting: Adult Health

## 2019-01-15 ENCOUNTER — Other Ambulatory Visit: Payer: Self-pay

## 2019-01-15 ENCOUNTER — Ambulatory Visit
Admission: RE | Admit: 2019-01-15 | Discharge: 2019-01-15 | Disposition: A | Discharge status: Home / Self Care | Payer: PRIVATE HEALTH INSURANCE | Attending: Adult Health | Admitting: Adult Health

## 2019-01-15 ENCOUNTER — Ambulatory Visit
Admission: RE | Admit: 2019-01-15 | Discharge: 2019-01-15 | Disposition: A | Discharge status: Home / Self Care | Payer: PRIVATE HEALTH INSURANCE | Source: Ambulatory Visit | Attending: Adult Health | Admitting: Adult Health

## 2019-01-15 VITALS — BP 137/89 | HR 72 | Temp 98.0°F | Resp 16 | Ht 64.0 in | Wt 271.3 lb

## 2019-01-15 DIAGNOSIS — K219 Gastro-esophageal reflux disease without esophagitis: Secondary | ICD-10-CM

## 2019-01-15 DIAGNOSIS — R05 Cough: Secondary | ICD-10-CM

## 2019-01-15 DIAGNOSIS — R059 Cough, unspecified: Secondary | ICD-10-CM

## 2019-01-15 MED ORDER — OMEPRAZOLE 20 MG PO CPDR: 20.0000 mg | DELAYED_RELEASE_CAPSULE | Freq: Every day | ORAL | 3 refills | Status: DC

## 2019-01-15 NOTE — Progress Notes (Signed)
[facility] [address] [place], [place] [address]  Internal MEDICINE  Office Visit Note  Patient Name: Tanya FootmanChastity Henderson  [id number]  [id number]  Date of Service: 01/15/2019   Complaints/HPI Pt is here for establishment of PCP. Chief Complaint  Patient presents with  . New Patient (Initial Visit)  . Cough    dry cough or cough up clear, throat feels constricted causing cough  . Shortness of Breath  . Gastroesophageal Reflux    dx with hitial??? hernia through endoscopy previously    HPI Pt is here to reestablish care. Pt reports she has been having sob, for about 2 months.  She reports she quit smoking about 1.5 months ago.  She was a 1 PPD smoker. She reports noticing her SOB more at night, or first thing in the morning. She reports waking up in the night. She does have a hiatal hernia that she was diagnosed with about 5 years ago.  She had an endoscopy at that time but no surgical repair. She also reports wheezing at night, however she says it sounds like its in her throat, not her lungs. She denies every having a sleep study.  She reports excessive daytime fatigue.  She does not feel rested in the morning.  She reports drinking coffee in the afternoon to help her stay awake.  She does report that she has a significant amount of personal stress right now.        Current Medication: Outpatient Encounter Medications as of 01/15/2019  Medication Sig  . BIOTIN PO Take by mouth daily.  Marland Kitchen. ibuprofen (ADVIL,MOTRIN) 400 MG tablet Take 800 mg by mouth every 6 (six) hours as needed for headache or mild pain.   Marland Kitchen. sertraline (ZOLOFT) 50 MG tablet Take 50 mg by mouth daily.  Marland Kitchen. VITAMIN D, CHOLECALCIFEROL, PO Take by mouth daily.  . [DISCONTINUED] albuterol (PROVENTIL HFA;VENTOLIN HFA) 108 (90 Base) MCG/ACT inhaler Inhale 2 puffs into the lungs every 4 (four) hours as needed for wheezing or shortness of breath. (Patient not taking: Reported on 01/15/2019)  . [DISCONTINUED]  brompheniramine-pseudoephedrine-DM 30-2-10 MG/5ML syrup Take 10 mLs by mouth 4 (four) times daily as needed. (Patient not taking: Reported on 01/15/2019)  . [DISCONTINUED] cetirizine (ZYRTEC) 10 MG tablet Take 1 tablet (10 mg total) by mouth daily. (Patient not taking: Reported on 01/15/2019)  . [DISCONTINUED] fluticasone (FLONASE) 50 MCG/ACT nasal spray Place 1 spray into both nostrils 2 (two) times daily. (Patient not taking: Reported on 01/15/2019)  . [DISCONTINUED] gentamicin (GARAMYCIN) 0.3 % ophthalmic solution Place 1 drop into the left eye 4 (four) times daily. (Patient not taking: Reported on 01/15/2019)  . [DISCONTINUED] hydrOXYzine (ATARAX/VISTARIL) 50 MG tablet Take 1 tablet (50 mg total) by mouth 3 (three) times daily as needed for itching. (Patient not taking: Reported on 01/15/2019)  . [DISCONTINUED] predniSONE (DELTASONE) 50 MG tablet Take 1 tablet (50 mg total) by mouth daily with breakfast. (Patient not taking: Reported on 01/15/2019)   No facility-administered encounter medications on file as of 01/15/2019.     Surgical History: Past Surgical History:  Procedure Laterality Date  . CHOLECYSTECTOMY    . CYSTOSCOPY W/ URETERAL STENT PLACEMENT Right 09/21/2015   Procedure: CYSTOSCOPY WITH STENT REPLACEMENT;  Surgeon: Vanna ScotlandAshley Brandon, MD;  Location: [facility];  Service: Urology;  Laterality: Right;  . CYSTOSCOPY W/ URETERAL STENT REMOVAL Right 02/10/2015   Procedure: CYSTOSCOPY WITH STENT REMOVAL;  Surgeon: Lorraine Laxichard D Hart, MD;  Location: [facility];  Service: Urology;  Laterality: Right;  .  CYSTOSCOPY WITH STENT PLACEMENT Right 01/29/2015   Procedure: CYSTOSCOPY WITH STENT PLACEMENT;  Surgeon: Crist FatBenjamin W Herrick, MD;  Location: [facility];  Service: Urology;  Laterality: Right;  . CYSTOSCOPY WITH STENT PLACEMENT Right 02/10/2015   Procedure: CYSTOSCOPY WITH STENT PLACEMENT;  Surgeon: Lorraine Laxichard D Hart, MD;  Location: [facility];  Service: Urology;  Laterality: Right;  . CYSTOSCOPY WITH STENT PLACEMENT  Right 09/07/2015   Procedure: CYSTOSCOPY WITH STENT PLACEMENT;  Surgeon: Crist FatBenjamin W Herrick, MD;  Location: [facility];  Service: Urology;  Laterality: Right;  . CYSTOSCOPY/RETROGRADE/URETEROSCOPY/STONE EXTRACTION WITH BASKET  05/26/2015   Procedure: CYSTOSCOPY/RETROGRADE/URETEROSCOPY/STONE EXTRACTION WITH BASKET;  Surgeon: Vanna ScotlandAshley Brandon, MD;  Location: [facility];  Service: Urology;;  . CYSTOSCOPY/URETEROSCOPY/HOLMIUM LASER  05/26/2015   Procedure: CYSTOSCOPY/URETEROSCOPY/HOLMIUM LASER;  Surgeon: Vanna ScotlandAshley Brandon, MD;  Location: [facility];  Service: Urology;;  . EXTRACORPOREAL SHOCK WAVE LITHOTRIPSY Right 04/22/2015   Procedure: EXTRACORPOREAL SHOCK WAVE LITHOTRIPSY (ESWL);  Surgeon: Hildred LaserBrian James Budzyn, MD;  Location: [facility];  Service: Urology;  Laterality: Right;  . KIDNEY STONE SURGERY    . OOPHORECTOMY Right 1996   Ovarian cyst  . URETEROSCOPY WITH HOLMIUM LASER LITHOTRIPSY Right 02/10/2015   Procedure: URETEROSCOPY WITH HOLMIUM LASER LITHOTRIPSY;  Surgeon: Lorraine Laxichard D Hart, MD;  Location: [facility];  Service: Urology;  Laterality: Right;  . URETEROSCOPY WITH HOLMIUM LASER LITHOTRIPSY Right 09/21/2015   Procedure: URETEROSCOPY WITH HOLMIUM LASER LITHOTRIPSY/ fragmentation and removal;  Surgeon: Vanna ScotlandAshley Brandon, MD;  Location: [facility];  Service: Urology;  Laterality: Right;    Medical History: Past Medical History:  Diagnosis Date  . Anxiety   . Bronchitis   . Chronic kidney disease    Kidney stones  . Depression   . GERD (gastroesophageal reflux disease)   . Headache   . Renal calculi   . Vitamin D deficiency     Family History: Family History  Problem Relation Age of Onset  . Lupus Mother   . Hypertension Mother   . Nephrolithiasis Paternal Grandfather   . Breast cancer Neg Hx     Social History   Socioeconomic History  . Marital status: Divorced    Spouse name: Not on file  . Number of children: Not on file  . Years of education: Not on file  . Highest education level: Not on  file  Occupational History  . Not on file  Social Needs  . Financial resource strain: Not on file  . Food insecurity    Worry: Not on file    Inability: Not on file  . Transportation needs    Medical: Not on file    Non-medical: Not on file  Tobacco Use  . Smoking status: Former Games developermoker  . Smokeless tobacco: Never Used  Substance and Sexual Activity  . Alcohol use: Yes    Comment: rare  . Drug use: No  . Sexual activity: Not on file  Lifestyle  . Physical activity    Days per week: Not on file    Minutes per session: Not on file  . Stress: Not on file  Relationships  . Social Musicianconnections    Talks on phone: Not on file    Gets together: Not on file    Attends religious service: Not on file    Active member of club or organization: Not on file    Attends meetings of clubs or organizations: Not on file    Relationship status: Not on file  . Intimate partner violence    Fear of current or ex  partner: Not on file    Emotionally abused: Not on file    Physically abused: Not on file    Forced sexual activity: Not on file  Other Topics Concern  . Not on file  Social History Narrative   [facility] [facility] [employer]   One son- 44 yo.     Review of Systems  Constitutional: Negative for chills, fatigue and unexpected weight change.  HENT: Negative for congestion, rhinorrhea, sneezing and sore throat.   Eyes: Negative for photophobia, pain and redness.  Respiratory: Negative for cough, chest tightness and shortness of breath.   Cardiovascular: Negative for chest pain and palpitations.  Gastrointestinal: Negative for abdominal pain, constipation, diarrhea, nausea and vomiting.  Endocrine: Negative.   Genitourinary: Negative for dysuria and frequency.  Musculoskeletal: Negative for arthralgias, back pain, joint swelling and neck pain.  Skin: Negative for rash.  Allergic/Immunologic: Negative.   Neurological: Negative for tremors and numbness.  Hematological: Negative for adenopathy.  Does not bruise/bleed easily.  Psychiatric/Behavioral: Negative for behavioral problems and sleep disturbance. The patient is not nervous/anxious.     Vital Signs: BP 137/89   Pulse 72   Temp 98 F (36.7 C)   Resp 16   Ht 5\' 4"  (1.626 m)   Wt 271 lb 4.8 oz (123.1 kg)   LMP 12/18/2018 Comment: pt states no chance of pregnancy  SpO2 97%   BMI 46.57 kg/m    Physical Exam Vitals signs and nursing note reviewed.  Constitutional:      General: She is not in acute distress.    Appearance: She is well-developed. She is not diaphoretic.  HENT:     Head: Normocephalic and atraumatic.     Mouth/Throat:     Pharynx: No oropharyngeal exudate.  Eyes:     Pupils: Pupils are equal, round, and reactive to light.  Neck:     Musculoskeletal: Normal range of motion and neck supple.     Thyroid: No thyromegaly.     Vascular: No JVD.     Trachea: No tracheal deviation.  Cardiovascular:     Rate and Rhythm: Normal rate and regular rhythm.     Heart sounds: Normal heart sounds. No murmur. No friction rub. No gallop.   Pulmonary:     Effort: Pulmonary effort is normal. No respiratory distress.     Breath sounds: Normal breath sounds. No wheezing or rales.  Chest:     Chest wall: No tenderness.  Abdominal:     Palpations: Abdomen is soft.     Tenderness: There is no abdominal tenderness. There is no guarding.  Musculoskeletal: Normal range of motion.  Lymphadenopathy:     Cervical: No cervical adenopathy.  Skin:    General: Skin is warm and dry.  Neurological:     Mental Status: She is alert and oriented to person, place, and time.     Cranial Nerves: No cranial nerve deficit.  Psychiatric:        Behavior: Behavior normal.        Thought Content: Thought content normal.        Judgment: Judgment normal.    Assessment/Plan: 1. Gastroesophageal reflux disease without esophagitis Take Prilosec as discussed.  - omeprazole (PRILOSEC) 20 MG capsule; Take 1 capsule (20 mg total) by  mouth daily.  Dispense: 30 capsule; Refill: 3  2. Cough Get CXR and PFT.  - DG Chest 2 View; Future - Pulmonary Function Test; Future  General Counseling: Jianni verbalizes understanding of the findings of todays  visit and agrees with plan of treatment. I have discussed any further diagnostic evaluation that may be needed or ordered today. We also reviewed her medications today. she has been encouraged to call the office with any questions or concerns that should arise related to todays visit.  No orders of the defined types were placed in this encounter.   No orders of the defined types were placed in this encounter.   Time spent: 30 Minutes   This patient was seen by Orson Gear AGNP-C in Collaboration with Dr Lavera Guise as a part of collaborative care agreement  Kendell Bane AGNP-C Internal Medicine

## 2019-01-21 ENCOUNTER — Ambulatory Visit: Payer: Self-pay | Admitting: Nurse Practitioner

## 2019-01-22 ENCOUNTER — Encounter: Payer: Self-pay | Admitting: Adult Health

## 2019-01-29 ENCOUNTER — Ambulatory Visit: Payer: No Typology Code available for payment source | Admitting: Internal Medicine

## 2019-02-13 ENCOUNTER — Ambulatory Visit: Payer: No Typology Code available for payment source | Admitting: Internal Medicine

## 2021-03-15 ENCOUNTER — Emergency Department: Payer: 59

## 2021-03-15 ENCOUNTER — Emergency Department
Admission: EM | Admit: 2021-03-15 | Discharge: 2021-03-15 | Disposition: A | Discharge status: Home / Self Care | Payer: 59 | Attending: Emergency Medicine | Admitting: Emergency Medicine

## 2021-03-15 DIAGNOSIS — Z87891 Personal history of nicotine dependence: Secondary | ICD-10-CM | POA: Insufficient documentation

## 2021-03-15 DIAGNOSIS — R209 Unspecified disturbances of skin sensation: Secondary | ICD-10-CM | POA: Insufficient documentation

## 2021-03-15 DIAGNOSIS — N189 Chronic kidney disease, unspecified: Secondary | ICD-10-CM | POA: Insufficient documentation

## 2021-03-15 DIAGNOSIS — G43809 Other migraine, not intractable, without status migrainosus: Secondary | ICD-10-CM | POA: Insufficient documentation

## 2021-03-15 LAB — COMPREHENSIVE METABOLIC PANEL
ALT: 55 U/L — ABNORMAL HIGH (ref 0–44)
AST: 37 U/L (ref 15–41)
Albumin: 3.9 g/dL (ref 3.5–5.0)
Alkaline Phosphatase: 135 U/L — ABNORMAL HIGH (ref 38–126)
Anion gap: 9 (ref 5–15)
BUN: 15 mg/dL (ref 6–20)
CO2: 23 mmol/L (ref 22–32)
Calcium: 9 mg/dL (ref 8.9–10.3)
Chloride: 104 mmol/L (ref 98–111)
Creatinine, Ser: 0.79 mg/dL (ref 0.44–1.00)
GFR, Estimated: >60 mL/min (ref >60)
Glucose, Bld: 88 mg/dL (ref 70–99)
Potassium: 4 mmol/L (ref 3.5–5.1)
Sodium: 136 mmol/L (ref 135–145)
Total Bilirubin: 0.8 mg/dL (ref 0.3–1.2)
Total Protein: 7.8 g/dL (ref 6.5–8.1)

## 2021-03-15 LAB — URINALYSIS, ROUTINE W REFLEX MICROSCOPIC
Bilirubin Urine: NEGATIVE
Glucose, UA: NEGATIVE mg/dL
Ketones, ur: NEGATIVE mg/dL
Leukocytes,Ua: NEGATIVE
Nitrite: NEGATIVE
Protein, ur: NEGATIVE mg/dL
Specific Gravity, Urine: 1.017 (ref 1.005–1.030)
pH: 5 (ref 5.0–8.0)

## 2021-03-15 LAB — CBC WITH DIFFERENTIAL/PLATELET
Abs Immature Granulocytes: 0.02 10*3/uL (ref 0.00–0.07)
Basophils Absolute: 0.1 10*3/uL (ref 0.0–0.1)
Basophils Relative: 1 %
Eosinophils Absolute: 0.3 10*3/uL (ref 0.0–0.5)
Eosinophils Relative: 4 %
HCT: 45.5 % (ref 36.0–46.0)
Hemoglobin: 14.7 g/dL (ref 12.0–15.0)
Immature Granulocytes: 0 %
Lymphocytes Relative: 21 %
Lymphs Abs: 1.6 10*3/uL (ref 0.7–4.0)
MCH: 28.9 pg (ref 26.0–34.0)
MCHC: 32.3 g/dL (ref 30.0–36.0)
MCV: 89.6 fL (ref 80.0–100.0)
Monocytes Absolute: 0.6 10*3/uL (ref 0.1–1.0)
Monocytes Relative: 7 %
Neutro Abs: 4.9 10*3/uL (ref 1.7–7.7)
Neutrophils Relative %: 67 %
Platelets: 277 10*3/uL (ref 150–400)
RBC: 5.08 MIL/uL (ref 3.87–5.11)
RDW: 12.9 % (ref 11.5–15.5)
WBC: 7.5 10*3/uL (ref 4.0–10.5)
nRBC: 0 % (ref 0.0–0.2)

## 2021-03-15 LAB — POC URINE PREG, ED: Preg Test, Ur: NEGATIVE

## 2021-03-15 MED ORDER — LOSARTAN POTASSIUM 25 MG PO TABS: 25.0000 mg | ORAL_TABLET | Freq: Every day | ORAL | 2 refills | Status: DC

## 2021-03-15 MED ORDER — KETOROLAC TROMETHAMINE 30 MG/ML IJ SOLN
30.0000 mg | Freq: Once | INTRAMUSCULAR | Status: AC
  Administered 2021-03-15: 30 mg via INTRAMUSCULAR
  Filled 2021-03-15: qty 1

## 2021-03-15 NOTE — ED Triage Notes (Signed)
Pt presents to ED with c/o of migraine that has been ongoing since this past Saturday. Pt states she takes BP at home and has been elevated, pt denies taking BP meds at this time. Pt is A&Ox4.   Pt denies HX of migraines at this time. Pt state staking multiple OTC meds with minimal relief. Pt denies injury or trauma to her head.

## 2021-03-15 NOTE — ED Provider Notes (Signed)
[facility]  ____________________________________________  Time seen: Approximately 4:39 PM  I have reviewed the triage vital signs and the nursing notes.   HISTORY  Chief Complaint Migraine   Historian Patient     HPI Tanya Henderson is a 45 y.o. female presents to the emergency department with a persistent migraine that has occurred since Saturday.  Patient reports that headache came on slowly and progressed in intensity gradually.  Patient states that her blood pressure has been elevated at home and she has never had to take medicine for hypertension in the past.  She denies rhinorrhea, nasal congestion or nonproductive cough at home.  She denies history of migraines.  She has been taking Tylenol and ibuprofen at home with little relief.  She also states that she has had some tingling in her right arm and her right leg which is new for her.  No chest pain, chest tightness or abdominal pain.   Past Medical History:  Diagnosis Date   Anxiety    Bronchitis    Chronic kidney disease    Kidney stones   Depression    GERD (gastroesophageal reflux disease)    Headache    Renal calculi    Vitamin D deficiency      Immunizations up to date:  Yes.     Past Medical History:  Diagnosis Date   Anxiety    Bronchitis    Chronic kidney disease    Kidney stones   Depression    GERD (gastroesophageal reflux disease)    Headache    Renal calculi    Vitamin D deficiency     Patient Active Problem List   Diagnosis Date Noted   Right ureteral stone 02/03/2015   Hydronephrosis, right 02/03/2015   Gross hematuria 02/03/2015   Compulsive tobacco user syndrome 02/02/2015   Biliary calculi 12/16/2013   Clinical depression 12/16/2013   Gastro-esophageal reflux disease without esophagitis 12/16/2013   Headache, migraine 12/16/2013   Unspecified vitamin D deficiency 01/13/2013   Hidradenitis 01/13/2013    Past Surgical History:  Procedure Laterality Date    CHOLECYSTECTOMY     CYSTOSCOPY W/ URETERAL STENT PLACEMENT Right 09/21/2015   Procedure: CYSTOSCOPY WITH STENT REPLACEMENT;  Surgeon: Hollice Espy, MD;  Location: [facility];  Service: Urology;  Laterality: Right;   CYSTOSCOPY W/ URETERAL STENT REMOVAL Right 02/10/2015   Procedure: CYSTOSCOPY WITH STENT REMOVAL;  Surgeon: Collier Flowers, MD;  Location: [facility];  Service: Urology;  Laterality: Right;   CYSTOSCOPY WITH STENT PLACEMENT Right 01/29/2015   Procedure: CYSTOSCOPY WITH STENT PLACEMENT;  Surgeon: Ardis Hughs, MD;  Location: [facility];  Service: Urology;  Laterality: Right;   CYSTOSCOPY WITH STENT PLACEMENT Right 02/10/2015   Procedure: CYSTOSCOPY WITH STENT PLACEMENT;  Surgeon: Collier Flowers, MD;  Location: [facility];  Service: Urology;  Laterality: Right;   CYSTOSCOPY WITH STENT PLACEMENT Right 09/07/2015   Procedure: CYSTOSCOPY WITH STENT PLACEMENT;  Surgeon: Ardis Hughs, MD;  Location: [facility];  Service: Urology;  Laterality: Right;   CYSTOSCOPY/RETROGRADE/URETEROSCOPY/STONE EXTRACTION WITH BASKET  05/26/2015   Procedure: CYSTOSCOPY/RETROGRADE/URETEROSCOPY/STONE EXTRACTION WITH BASKET;  Surgeon: Hollice Espy, MD;  Location: [facility];  Service: Urology;;   CYSTOSCOPY/URETEROSCOPY/HOLMIUM LASER  05/26/2015   Procedure: CYSTOSCOPY/URETEROSCOPY/HOLMIUM LASER;  Surgeon: Hollice Espy, MD;  Location: [facility];  Service: Urology;;   EXTRACORPOREAL SHOCK WAVE LITHOTRIPSY Right 04/22/2015   Procedure: EXTRACORPOREAL SHOCK WAVE LITHOTRIPSY (ESWL);  Surgeon: Nickie Retort, MD;  Location: [facility];  Service: Urology;  Laterality: Right;  KIDNEY STONE SURGERY     OOPHORECTOMY Right 1996   Ovarian cyst   URETEROSCOPY WITH HOLMIUM LASER LITHOTRIPSY Right 02/10/2015   Procedure: URETEROSCOPY WITH HOLMIUM LASER LITHOTRIPSY;  Surgeon: Collier Flowers, MD;  Location: [facility];  Service: Urology;  Laterality: Right;   URETEROSCOPY WITH HOLMIUM LASER LITHOTRIPSY Right 09/21/2015   Procedure:  URETEROSCOPY WITH HOLMIUM LASER LITHOTRIPSY/ fragmentation and removal;  Surgeon: Hollice Espy, MD;  Location: [facility];  Service: Urology;  Laterality: Right;    Prior to Admission medications   Medication Sig Start Date End Date Taking? Authorizing Provider  losartan (COZAAR) 25 MG tablet Take 1 tablet (25 mg total) by mouth daily. 03/15/21 04/14/21 Yes Vallarie Mare M, PA-C  BIOTIN PO Take by mouth daily.    [provider]  ibuprofen (ADVIL,MOTRIN) 400 MG tablet Take 800 mg by mouth every 6 (six) hours as needed for headache or mild pain.     [provider]  omeprazole (PRILOSEC) 20 MG capsule Take 1 capsule (20 mg total) by mouth daily. 01/15/19   Kendell Bane, NP  sertraline (ZOLOFT) 50 MG tablet Take 50 mg by mouth daily.    [provider]  VITAMIN D, CHOLECALCIFEROL, PO Take by mouth daily.    [provider]    Allergies Clindamycin/lincomycin and Penicillins  Family History  Problem Relation Age of Onset   Lupus Mother    Hypertension Mother    Nephrolithiasis Paternal Grandfather    Breast cancer Neg Hx     Social History Social History   Tobacco Use   Smoking status: Former   Smokeless tobacco: Never  Substance Use Topics   Alcohol use: Yes    Comment: rare   Drug use: No     Review of Systems  Constitutional: No fever/chills Eyes:  No discharge ENT: No upper respiratory complaints. Respiratory: no cough. No SOB/ use of accessory muscles to breath Gastrointestinal:   No nausea, no vomiting.  No diarrhea.  No constipation. Musculoskeletal: Negative for musculoskeletal pain. Neuro: Patient has headache.  Skin: Negative for rash, abrasions, lacerations, ecchymosis.    ____________________________________________   PHYSICAL EXAM:  VITAL SIGNS: ED Triage Vitals  Enc Vitals Group     BP 03/15/21 1540 (!) 168/79     Pulse Rate 03/15/21 1540 83     Resp 03/15/21 1540 17     Temp 03/15/21 1540 98.4 F (36.9 C)      Temp Source 03/15/21 1540 Oral     SpO2 03/15/21 1540 97 %     Weight --      Height --      Head Circumference --      Peak Flow --      Pain Score 03/15/21 1627 7     Pain Loc --      Pain Edu? --      Excl. in [place]? --      Constitutional: Alert and oriented. Well appearing and in no acute distress. Eyes: Conjunctivae are normal. PERRL. EOMI. Head: Atraumatic. ENT:      Nose: No congestion/rhinnorhea.      Mouth/Throat: Mucous membranes are moist.  Neck: No stridor.  No cervical spine tenderness to palpation. Cardiovascular: Normal rate, regular rhythm. Normal S1 and S2.  Good peripheral circulation. Respiratory: Normal respiratory effort without tachypnea or retractions. Lungs CTAB. Good air entry to the bases with no decreased or absent breath sounds Gastrointestinal: Bowel sounds x 4 quadrants. Soft and nontender to palpation. No guarding  or rigidity. No distention. Musculoskeletal: Patient has symmetric strength in the upper and lower extremities.  Full range of motion to all extremities. No obvious deformities noted Neurologic:  Normal for age. No gross focal neurologic deficits are appreciated.  Negative Romberg Skin:  Skin is warm, dry and intact. No rash noted. Psychiatric: Mood and affect are normal for age. Speech and behavior are normal.   ____________________________________________   LABS (all labs ordered are listed, but only abnormal results are displayed)  Labs Reviewed  COMPREHENSIVE METABOLIC PANEL - Abnormal; Notable for the following components:      Result Value   ALT 55 (*)    Alkaline Phosphatase 135 (*)    All other components within normal limits  CBC WITH DIFFERENTIAL/PLATELET  URINALYSIS, ROUTINE W REFLEX MICROSCOPIC  POC URINE PREG, ED   ____________________________________________  EKG   ____________________________________________  RADIOLOGY Unk Pinto, personally viewed and evaluated these images (plain radiographs) as  part of my medical decision making, as well as reviewing the written report by the radiologist.    [place] (5MM)  Result Date: 03/15/2021 CLINICAL DATA:  Headache, classic migraine EXAM: CT HEAD WITHOUT CONTRAST TECHNIQUE: Contiguous axial images were obtained from the base of the skull through the vertex without intravenous contrast. COMPARISON:  None. FINDINGS: Brain: No evidence of acute infarction, hemorrhage, hydrocephalus, extra-axial collection or mass lesion/mass effect. Partially empty sella. Vascular: No hyperdense vessel identified. Skull: No acute fracture. Sinuses/Orbits: Visualized sinuses are clear. Visualized orbits are unremarkable. Other: No mastoid effusions. IMPRESSION: 1. No evidence of acute intracranial abnormality. 2. Partially empty sella, which is often a normal anatomic variant but can be associated with idiopathic intracranial hypertension. Electronically Signed   By: Margaretha Sheffield M.D.   On: 03/15/2021 17:13    ____________________________________________    PROCEDURES  Procedure(s) performed:     Procedures     Medications  ketorolac (TORADOL) 30 MG/ML injection 30 mg (has no administration in time range)     ____________________________________________   INITIAL IMPRESSION / ASSESSMENT AND PLAN / ED COURSE  Pertinent labs & imaging results that were available during my care of the patient were reviewed by me and considered in my medical decision making (see chart for details).     Assessment and plan: Headache Hypertension  45 year old female presents to the emergency department with headache that has occurred intermittently since Saturday.  Patient's blood pressure has been elevated daily since headache started.  Patient was hypertensive at triage but vital signs were otherwise reassuring.  She had no neurodeficits on exam.  Basic labs were obtained which showed mildly elevated ALT and alk phos which patient stated is  consistent with her baseline.  CT head shows no evidence of intracranial bleed or other acute abnormality.  Patient was given an injection of Toradol in the emergency department for headache and started on losartan for hypertension.  She was advised to follow-up with primary care if headache persists.  Return precautions were given to return to the emergency department if headache seems to be worsening at home.  All patient questions were answered.  ____________________________________________  FINAL CLINICAL IMPRESSION(S) / ED DIAGNOSES  Final diagnoses:  Other migraine without status migrainosus, not intractable      NEW MEDICATIONS STARTED DURING THIS VISIT:  ED Discharge Orders          Ordered    losartan (COZAAR) 25 MG tablet  Daily        03/15/21 2036  This chart was dictated using voice recognition software/Dragon. Despite best efforts to proofread, errors can occur which can change the meaning. Any change was purely unintentional.     Karren Cobble 03/15/21 2113    Delman Kitten, MD 03/15/21 [phone]

## 2021-03-15 NOTE — Discharge Instructions (Signed)
Please start losartan for hypertension and follow up with your PCP.

## 2021-03-21 ENCOUNTER — Other Ambulatory Visit: Payer: Self-pay | Admitting: Internal Medicine

## 2021-03-21 DIAGNOSIS — Z1231 Encounter for screening mammogram for malignant neoplasm of breast: Secondary | ICD-10-CM

## 2021-05-23 ENCOUNTER — Observation Stay
Admission: EM | Admit: 2021-05-23 | Discharge: 2021-05-25 | Disposition: A | Discharge status: Home / Self Care | Payer: Commercial Managed Care - PPO | Attending: Student | Admitting: Student

## 2021-05-23 ENCOUNTER — Emergency Department: Payer: Commercial Managed Care - PPO

## 2021-05-23 DIAGNOSIS — L732 Hidradenitis suppurativa: Secondary | ICD-10-CM | POA: Diagnosis present

## 2021-05-23 DIAGNOSIS — F17200 Nicotine dependence, unspecified, uncomplicated: Secondary | ICD-10-CM | POA: Diagnosis present

## 2021-05-23 DIAGNOSIS — N189 Chronic kidney disease, unspecified: Secondary | ICD-10-CM | POA: Insufficient documentation

## 2021-05-23 DIAGNOSIS — I639 Cerebral infarction, unspecified: Principal | ICD-10-CM | POA: Diagnosis present

## 2021-05-23 DIAGNOSIS — I129 Hypertensive chronic kidney disease with stage 1 through stage 4 chronic kidney disease, or unspecified chronic kidney disease: Secondary | ICD-10-CM | POA: Insufficient documentation

## 2021-05-23 DIAGNOSIS — Z8673 Personal history of transient ischemic attack (TIA), and cerebral infarction without residual deficits: Secondary | ICD-10-CM | POA: Insufficient documentation

## 2021-05-23 DIAGNOSIS — Z20822 Contact with and (suspected) exposure to covid-19: Secondary | ICD-10-CM | POA: Insufficient documentation

## 2021-05-23 DIAGNOSIS — Z79899 Other long term (current) drug therapy: Secondary | ICD-10-CM | POA: Insufficient documentation

## 2021-05-23 DIAGNOSIS — G43909 Migraine, unspecified, not intractable, without status migrainosus: Secondary | ICD-10-CM | POA: Diagnosis present

## 2021-05-23 DIAGNOSIS — F32A Depression, unspecified: Secondary | ICD-10-CM | POA: Diagnosis present

## 2021-05-23 DIAGNOSIS — R531 Weakness: Secondary | ICD-10-CM

## 2021-05-23 DIAGNOSIS — Y900 Blood alcohol level of less than 20 mg/100 ml: Secondary | ICD-10-CM | POA: Insufficient documentation

## 2021-05-23 DIAGNOSIS — E66813 Obesity, class 3: Secondary | ICD-10-CM

## 2021-05-23 DIAGNOSIS — Z87891 Personal history of nicotine dependence: Secondary | ICD-10-CM | POA: Insufficient documentation

## 2021-05-23 DIAGNOSIS — K219 Gastro-esophageal reflux disease without esophagitis: Secondary | ICD-10-CM | POA: Diagnosis present

## 2021-05-23 HISTORY — DX: Personal history of transient ischemic attack (TIA), and cerebral infarction without residual deficits: Z86.73

## 2021-05-23 LAB — COMPREHENSIVE METABOLIC PANEL
ALT: 74 U/L — ABNORMAL HIGH (ref 0–44)
AST: 48 U/L — ABNORMAL HIGH (ref 15–41)
Albumin: 4.1 g/dL (ref 3.5–5.0)
Alkaline Phosphatase: 161 U/L — ABNORMAL HIGH (ref 38–126)
Anion gap: 5 (ref 5–15)
BUN: 11 mg/dL (ref 6–20)
CO2: 25 mmol/L (ref 22–32)
Calcium: 9.1 mg/dL (ref 8.9–10.3)
Chloride: 105 mmol/L (ref 98–111)
Creatinine, Ser: 0.73 mg/dL (ref 0.44–1.00)
GFR, Estimated: >60 mL/min (ref >60)
Glucose, Bld: 100 mg/dL — ABNORMAL HIGH (ref 70–99)
Potassium: 4 mmol/L (ref 3.5–5.1)
Sodium: 135 mmol/L (ref 135–145)
Total Bilirubin: 0.6 mg/dL (ref 0.3–1.2)
Total Protein: 7.9 g/dL (ref 6.5–8.1)

## 2021-05-23 LAB — CBC
HCT: 42.1 % (ref 36.0–46.0)
Hemoglobin: 13.9 g/dL (ref 12.0–15.0)
MCH: 29.8 pg (ref 26.0–34.0)
MCHC: 33 g/dL (ref 30.0–36.0)
MCV: 90.1 fL (ref 80.0–100.0)
Platelets: 316 10*3/uL (ref 150–400)
RBC: 4.67 MIL/uL (ref 3.87–5.11)
RDW: 13.2 % (ref 11.5–15.5)
WBC: 8.8 10*3/uL (ref 4.0–10.5)
nRBC: 0 % (ref 0.0–0.2)

## 2021-05-23 LAB — TROPONIN I (HIGH SENSITIVITY): Troponin I (High Sensitivity): 4 ng/L (ref <18)

## 2021-05-23 LAB — URINALYSIS, ROUTINE W REFLEX MICROSCOPIC
Bacteria, UA: NONE SEEN
Bilirubin Urine: NEGATIVE
Glucose, UA: NEGATIVE mg/dL
Ketones, ur: NEGATIVE mg/dL
Leukocytes,Ua: NEGATIVE
Nitrite: NEGATIVE
Protein, ur: NEGATIVE mg/dL
RBC / HPF: >50 RBC/hpf — ABNORMAL HIGH (ref 0–5)
Specific Gravity, Urine: 1.02 (ref 1.005–1.030)
pH: 5 (ref 5.0–8.0)

## 2021-05-23 LAB — POC URINE PREG, ED: Preg Test, Ur: NEGATIVE

## 2021-05-23 MED ORDER — GADOBUTROL 1 MMOL/ML IV SOLN
10.0000 mL | Freq: Once | INTRAVENOUS | Status: DC | PRN
  Filled 2021-05-23: qty 10

## 2021-05-23 NOTE — ED Provider Notes (Signed)
HPI: Pt is a 45 y.o. female who presents with complaints of R sided feeling strange  The patient p/w  R sided feel strange after standing up.  Denies this ever happening previously.  Symptom onset was at 8 PM.  Was normal prior to that.  ROS: Denies fever, chest pain, vomiting  Past Medical History:  Diagnosis Date   Anxiety    Bronchitis    Chronic kidney disease    Kidney stones   Depression    GERD (gastroesophageal reflux disease)    Headache    Renal calculi    Vitamin D deficiency    Vitals:   05/23/21 2110  BP: 134/76  Pulse: 84  Resp: 18  Temp: 97.9 F (36.6 C)  SpO2: 97%    Focused Physical Exam: Gen: No acute distress Head: atraumatic, normocephalic Eyes: Extraocular movements grossly intact; conjunctiva clear CV: RRR Lung: No increased WOB, no stridor GI: ND, no obvious masses Neuro: Alert and awake nerves II to XII are intact.  Equal strength in arms and legs.  No pronator drift.  No leg drift.  Patient is ambulatory around the room.  Patient does report subjective weakness on the right.  NIH stroke scale is 0.  Contrary to triage note there is no numbness or tingling.  She is sensation intact throughout  Medical Decision Making and Plan: Given the patient's initial medical screening exam, the following diagnostic evaluation has been ordered. The patient will be placed in the appropriate treatment space, once one is available, to complete the evaluation and treatment. I have discussed the plan of care with the patient and I have advised the patient that an ED physician or mid-level practitioner will reevaluate their condition after the test results have been received, as the results may give them additional insight into the type of treatment they may need.   Diagnostics: CT head was negative.  I discussed with patient that she could be having a small stroke but I did not call a stroke code given I do not feel if she is a tPA candidate given her low NIH stroke scale  of zero.  Patient expressed understanding for this.  However we will get an MRI and I have called MRI and they should be able to get her in the next 30 minutes.  Patient understands that if her symptoms are worsening to let us know because she would be a tPA candidate if symptoms are worsening before 1230AM given last known normal of 8 PM on 12/5.  Patient stated that her symptoms of already gotten better and again this would be a reason not to do tPA at this time or call stroke code.  We have no beds available in ED at this time.   Treatments: none immediately   Concha Se, MD 05/23/21 2217

## 2021-05-23 NOTE — ED Notes (Signed)
Pt to MRI

## 2021-05-23 NOTE — ED Triage Notes (Addendum)
Pt presents via POV with complaints of right arm & leg numbness that started an hour ago. Pt was ambulatory from the waiting room to triage without difficulty. Pt states she's "an employee at [employer] and the RN on staff stated she was having a stroke." This RN informed MD  view orders for intervention. NIHSS 0. Denies CP, SOB.

## 2021-05-23 NOTE — ED Provider Notes (Addendum)
[facility] Emergency Department Provider Note  ____________________________________________   Event Date/Time   First MD Initiated Contact with Patient 05/23/21 2253     (approximate)  I have reviewed the triage vital signs and the nursing notes.   HISTORY  Chief Complaint Weakness   HPI Tanya Henderson is a 45 y.o. female with history of CKD who comes in with concern for right arm weakness.  I saw patient in triage and there was a stroke scale of 0 therefore stroke code was not called.  Weakness has been improving, unclear what brought it on.  Denies ever having a stroke previously          Past Medical History:  Diagnosis Date   Anxiety    Bronchitis    Chronic kidney disease    Kidney stones   Depression    GERD (gastroesophageal reflux disease)    Headache    Renal calculi    Vitamin D deficiency     Patient Active Problem List   Diagnosis Date Noted   Right ureteral stone 02/03/2015   Hydronephrosis, right 02/03/2015   Gross hematuria 02/03/2015   Compulsive tobacco user syndrome 02/02/2015   Biliary calculi 12/16/2013   Clinical depression 12/16/2013   Gastro-esophageal reflux disease without esophagitis 12/16/2013   Headache, migraine 12/16/2013   Unspecified vitamin D deficiency 01/13/2013   Hidradenitis 01/13/2013    Past Surgical History:  Procedure Laterality Date   CHOLECYSTECTOMY     CYSTOSCOPY W/ URETERAL STENT PLACEMENT Right 09/21/2015   Procedure: CYSTOSCOPY WITH STENT REPLACEMENT;  Surgeon: Hollice Espy, MD;  Location: [facility];  Service: Urology;  Laterality: Right;   CYSTOSCOPY W/ URETERAL STENT REMOVAL Right 02/10/2015   Procedure: CYSTOSCOPY WITH STENT REMOVAL;  Surgeon: Collier Flowers, MD;  Location: [facility];  Service: Urology;  Laterality: Right;   CYSTOSCOPY WITH STENT PLACEMENT Right 01/29/2015   Procedure: CYSTOSCOPY WITH STENT PLACEMENT;  Surgeon: Ardis Hughs, MD;  Location: [facility];  Service:  Urology;  Laterality: Right;   CYSTOSCOPY WITH STENT PLACEMENT Right 02/10/2015   Procedure: CYSTOSCOPY WITH STENT PLACEMENT;  Surgeon: Collier Flowers, MD;  Location: [facility];  Service: Urology;  Laterality: Right;   CYSTOSCOPY WITH STENT PLACEMENT Right 09/07/2015   Procedure: CYSTOSCOPY WITH STENT PLACEMENT;  Surgeon: Ardis Hughs, MD;  Location: [facility];  Service: Urology;  Laterality: Right;   CYSTOSCOPY/RETROGRADE/URETEROSCOPY/STONE EXTRACTION WITH BASKET  05/26/2015   Procedure: CYSTOSCOPY/RETROGRADE/URETEROSCOPY/STONE EXTRACTION WITH BASKET;  Surgeon: Hollice Espy, MD;  Location: [facility];  Service: Urology;;   CYSTOSCOPY/URETEROSCOPY/HOLMIUM LASER  05/26/2015   Procedure: CYSTOSCOPY/URETEROSCOPY/HOLMIUM LASER;  Surgeon: Hollice Espy, MD;  Location: [facility];  Service: Urology;;   EXTRACORPOREAL SHOCK WAVE LITHOTRIPSY Right 04/22/2015   Procedure: EXTRACORPOREAL SHOCK WAVE LITHOTRIPSY (ESWL);  Surgeon: Nickie Retort, MD;  Location: [facility];  Service: Urology;  Laterality: Right;   KIDNEY STONE SURGERY     OOPHORECTOMY Right 1996   Ovarian cyst   URETEROSCOPY WITH HOLMIUM LASER LITHOTRIPSY Right 02/10/2015   Procedure: URETEROSCOPY WITH HOLMIUM LASER LITHOTRIPSY;  Surgeon: Collier Flowers, MD;  Location: [facility];  Service: Urology;  Laterality: Right;   URETEROSCOPY WITH HOLMIUM LASER LITHOTRIPSY Right 09/21/2015   Procedure: URETEROSCOPY WITH HOLMIUM LASER LITHOTRIPSY/ fragmentation and removal;  Surgeon: Hollice Espy, MD;  Location: [facility];  Service: Urology;  Laterality: Right;    Prior to Admission medications   Medication Sig Start Date End Date Taking? Authorizing Provider  BIOTIN PO Take by mouth daily.  [provider]  ibuprofen (ADVIL,MOTRIN) 400 MG tablet Take 800 mg by mouth every 6 (six) hours as needed for headache or mild pain.     [provider]  losartan (COZAAR) 25 MG tablet Take 1 tablet (25 mg total) by mouth daily. 03/15/21 04/14/21   Lannie Fields, PA-C  omeprazole (PRILOSEC) 20 MG capsule Take 1 capsule (20 mg total) by mouth daily. 01/15/19   Kendell Bane, NP  sertraline (ZOLOFT) 50 MG tablet Take 50 mg by mouth daily.    [provider]  VITAMIN D, CHOLECALCIFEROL, PO Take by mouth daily.    [provider]    Allergies Clindamycin/lincomycin and Penicillins  Family History  Problem Relation Age of Onset   Lupus Mother    Hypertension Mother    Nephrolithiasis Paternal Grandfather    Breast cancer Neg Hx     Social History Social History   Tobacco Use   Smoking status: Former   Smokeless tobacco: Never  Substance Use Topics   Alcohol use: Yes    Comment: rare   Drug use: No      Review of Systems Constitutional: No fever/chills Eyes: No visual changes. ENT: No sore throat. Cardiovascular: Denies chest pain. Respiratory: Denies shortness of breath. Gastrointestinal: No abdominal pain.  No nausea, no vomiting.  No diarrhea.  No constipation. Genitourinary: Negative for dysuria. Musculoskeletal: Negative for back pain. Skin: Negative for rash. Neurological: Reports feeling strange on the right leg and arm All other ROS negative ____________________________________________   PHYSICAL EXAM:  VITAL SIGNS: ED Triage Vitals  Enc Vitals Group     BP 05/23/21 2110 134/76     Pulse Rate 05/23/21 2110 84     Resp 05/23/21 2110 18     Temp 05/23/21 2110 97.9 F (36.6 C)     Temp Source 05/23/21 2110 Oral     SpO2 05/23/21 2110 97 %     Weight 05/23/21 2119 265 lb (120.2 kg)     Height 05/23/21 2119 5\' 4"  (1.626 m)     Head Circumference --      Peak Flow --      Pain Score 05/23/21 2119 0     Pain Loc --      Pain Edu? --      Excl. in [place]? --     Constitutional: Alert and oriented. Well appearing and in no acute distress. Eyes: Conjunctivae are normal. EOMI. Head: Atraumatic. Nose: No congestion/rhinnorhea. Mouth/Throat: Mucous membranes are moist.   Neck: No  stridor. Trachea Midline. FROM Cardiovascular: Normal rate, regular rhythm. Grossly normal heart sounds.  Good peripheral circulation. Respiratory: Normal respiratory effort.  No retractions. Lungs CTAB. Gastrointestinal: Soft and nontender. No distention. No abdominal bruits.  Musculoskeletal: No lower extremity tenderness nor edema.  No joint effusions. Neurologic:  Normal speech and language. No gross focal neurologic deficits are appreciated.  Cranial nerves II through XII are intact.  Equal strength in arms and legs. Skin:  Skin is warm, dry and intact. No rash noted. Psychiatric: Mood and affect are normal. Speech and behavior are normal. GU: Deferred   ____________________________________________   LABS (all labs ordered are listed, but only abnormal results are displayed)  Labs Reviewed  COMPREHENSIVE METABOLIC PANEL - Abnormal; Notable for the following components:      Result Value   Glucose, Bld 100 (*)    AST 48 (*)    ALT 74 (*)    Alkaline Phosphatase 161 (*)    All  other components within normal limits  URINALYSIS, ROUTINE W REFLEX MICROSCOPIC - Abnormal; Notable for the following components:   Color, Urine YELLOW (*)    APPearance CLEAR (*)    Hgb urine dipstick LARGE (*)    RBC / HPF >50 (*)    All other components within normal limits  CBC  POC URINE PREG, ED  TROPONIN I (HIGH SENSITIVITY)  TROPONIN I (HIGH SENSITIVITY)   ____________________________________________   ED ECG REPORT I, Concha Se, the attending physician, personally viewed and interpreted this ECG.  Normal sinus rate of 71, no ST elevation, no T wave versions, normal intervals ____________________________________________  RADIOLOGY Vela Prose, personally viewed and evaluated these images (plain radiographs) as part of my medical decision making, as well as reviewing the written report by the radiologist.  ED MD interpretation: No intracranial hemorrhage  Official radiology  report(s): CT HEAD WO CONTRAST ( )  Result Date: 05/23/2021 CLINICAL DATA:  Neuro deficit, acute, stroke suspected. Right arm and leg numbness EXAM: CT HEAD WITHOUT CONTRAST TECHNIQUE: Contiguous axial images were obtained from the base of the skull through the vertex without intravenous contrast. COMPARISON:  None. FINDINGS: Brain: Normal anatomic configuration. No abnormal intra or extra-axial mass lesion or fluid collection. No abnormal mass effect or midline shift. No evidence of acute intracranial hemorrhage or infarct. Ventricular size is normal. Cerebellum unremarkable. Vascular: Unremarkable Skull: Intact Sinuses/Orbits: Paranasal sinuses are clear. Orbits are unremarkable. Other: Mastoid air cells and middle ear cavities are clear. IMPRESSION: No acute abnormality.  Normal examination. Electronically Signed   By: Helyn Numbers M.D.   On: 05/23/2021 22:01    ____________________________________________   PROCEDURES  Procedure(s) performed (including Critical Care):  Procedures   ____________________________________________   INITIAL IMPRESSION / ASSESSMENT AND PLAN / ED COURSE  Danaja Marcellus was evaluated in Emergency Department on 05/23/2021 for the symptoms described in the history of present illness. She was evaluated in the context of the global COVID-19 pandemic, which necessitated consideration that the patient might be at risk for infection with the SARS-CoV-2 virus that causes COVID-19. Institutional protocols and algorithms that pertain to the evaluation of patients at risk for COVID-19 are in a state of rapid change based on information released by regulatory bodies including the CDC and federal and state organizations. These policies and algorithms were followed during the patient's care in the ED.    Patient was seen out in triage given no beds were available initially stroke code was not called due to NIH stroke scale of 0.  However I got an MRI that showed concern for  active stroke therefore he wanted to have her back into her room in case her symptoms are worsening she would be in the stroke code window up until 12:30 AM.  Therefore if her NIH stroke scale is increasing she may be a tPA candidate.  I added on on MRA's to evaluate for any stenosis, LVO.  I discussed with neurology and called a stroke code so they can be on board.  No chest pain or other symptoms to suggest dissection  Patient will be back to MRI prior to me leaving but will be immediately placed into room 15 for neurological consult and repeat neuro exam.  Again if symptoms are worsening could consider tPA   ____________________________________________   FINAL CLINICAL IMPRESSION(S) / ED DIAGNOSES   Final diagnoses:  Left-sided weakness  Cerebrovascular accident (CVA), unspecified mechanism (HCC)      MEDICATIONS GIVEN DURING THIS VISIT:  Medications  gadobutrol (GADAVIST) 1 MMOL/ML injection 10 mL (has no administration in time range)     ED Discharge Orders     None        Note:  This document was prepared using Dragon voice recognition software and may include unintentional dictation errors.    Vanessa Ochiltree, MD 05/23/21 2300    Vanessa , MD 05/23/21 [phone]

## 2021-05-23 NOTE — Consult Note (Signed)
[facility] Consult Services   Patient Name:   Tanya Henderson, Tanya Henderson Date of Birth:   05-22-1976 Identification Number:   MRN - [id number] Date of Service:   05/23/2021 22:53:09  Diagnosis:       I63.9 - Cerebrovascular accident (CVA), unspecified mechanism (HCC)  Impression:      Tanya Henderson is a 45 year-old woman with a PMH of Anxiety, Depression, Kidney Stones, HTN, GERD, Tobacco User, Headache who presents with right arm and right leg weakness. tPA was not given as she has no disabling symptoms.  Metrics: Last Known Well: 05/23/2021 20:00:00 [facility] Notification Time: 05/23/2021 22:52:45 Arrival Time: 05/23/2021 22:41:00 Stamp Time: 05/23/2021 22:53:09 Initial Response Time: 05/23/2021 22:54:35 Symptoms: Right arm and leg numbness. NIHSS Start Assessment Time: 05/23/2021 23:31:41 Patient is not a candidate for Thrombolytic. Thrombolytic Medical Decision: 05/23/2021 23:05:42 Patient was not deemed candidate for Thrombolytic because of following reasons: No disabling symptoms.  CT head showed no acute hemorrhage or acute core infarct.  ED Physician notified of diagnostic impression and management plan on 05/23/2021 23:40:20  Advanced Imaging: Advanced Imaging Not Completed because:  LVO is not suspected   Our recommendations are outlined below.  Recommendations:        Stroke/Telemetry Floor       Neuro Checks       Bedside Swallow Eval       DVT Prophylaxis       IV Fluids, Normal Saline       Head of Bed 30 Degrees       Euglycemia and Avoid Hyperthermia (PRN Acetaminophen)       Initiate or continue Aspirin 325 MG daily       Antihypertensives PRN if Blood pressure is greater than 220/120 or there is a concern for End organ damage/contraindications for permissive HTN. If blood pressure is greater than 220/120 give labetalol PO or IV or Vasotec IV with a goal of 15% reduction in BP during the first 24 hours.       - MRI Brain  without contrast       - MRA head and Neck Stroke Protocol       - TTE       - Labs: lipid panel; HgbA1c       - PT/OT/ST where applicable  Routine Consultation with Inhouse Neurology for Follow up Care  Sign Out:       Discussed with Emergency Department Provider    ------------------------------------------------------------------------------  History of Present Illness: Patient is a 45 year old Female.  Patient was brought by private transportation with symptoms of Right arm and leg numbness.  Tanya Henderson is a 44 year-old woman with a PMH of Anxiety, Depression, Kidney Stones, HTN, GERD, Tobacco User, Headache who presents with right arm and right leg weakness. 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 She stood up and her right leg felt wobbly. She says that she almost tripped while walking. Tanya Henderson reports that she then noticed that her right arm was weak and she was having trouble using her phone with the right hand, especially texting. She states that nothing like this has ever happened to her before.    Past Medical History:      Hypertension      There is no history of Diabetes Mellitus      There is no history of Hyperlipidemia      There is no history of Atrial Fibrillation  There is no history of Coronary Artery Disease      There is no history of Stroke  No Anticoagulant use   No Antiplatelet use  Allergies:  Reviewed Allergies Text: Clindamycin, PCN     Examination: BP(155/96), Pulse(84), 1A: Level of Consciousness - Alert; keenly responsive + 0 1B: Ask Month and Age - Both Questions Right + 0 1C: Blink Eyes & Squeeze Hands - Performs Both Tasks + 0 2: Test Horizontal Extraocular Movements - Normal + 0 3: Test Visual Fields - No Visual Loss + 0 4: Test Facial Palsy (Use Grimace if Obtunded) - Normal symmetry + 0 5A: Test Left Arm Motor Drift - No Drift for 10 Seconds + 0 5B: Test Right Arm Motor Drift - Drift,  but doesn't hit bed + 1 6A: Test Left Leg Motor Drift - No Drift for 5 Seconds + 0 6B: Test Right Leg Motor Drift - Drift, but doesn't hit bed + 1 7: Test Limb Ataxia (FNF/Heel-Shin) - No Ataxia + 0 8: Test Sensation - Normal; No sensory loss + 0 9: Test Language/Aphasia - Normal; No aphasia + 0 10: Test Dysarthria - Normal + 0 11: Test Extinction/Inattention - No abnormality + 0  NIHSS Score: 2   Pre-Morbid Modified Rankin Scale: 0 Points = No symptoms at all   Patient/Family was informed the Neurology Consult would occur via TeleHealth consult by way of interactive audio and video telecommunications and consented to receiving care in this manner.   Patient is being evaluated for possible acute neurologic impairment and high probability of imminent or life-threatening deterioration. I spent total of 45 minutes providing care to this patient, including time for face to face visit via telemedicine, review of medical records, imaging studies and discussion of findings with providers, the patient and/or family.   Dr Asa Saunas   [facility] [phone]  Case LO:[id number]

## 2021-05-24 ENCOUNTER — Observation Stay: Payer: Commercial Managed Care - PPO

## 2021-05-24 ENCOUNTER — Observation Stay (HOSPITAL_BASED_OUTPATIENT_CLINIC_OR_DEPARTMENT_OTHER)
Admit: 2021-05-24 | Discharge: 2021-05-24 | Disposition: A | Discharge status: Home / Self Care | Payer: Commercial Managed Care - PPO | Attending: Internal Medicine | Admitting: Internal Medicine

## 2021-05-24 ENCOUNTER — Encounter: Payer: Self-pay | Admitting: Internal Medicine

## 2021-05-24 DIAGNOSIS — I6389 Other cerebral infarction: Secondary | ICD-10-CM

## 2021-05-24 DIAGNOSIS — K219 Gastro-esophageal reflux disease without esophagitis: Secondary | ICD-10-CM

## 2021-05-24 DIAGNOSIS — F17200 Nicotine dependence, unspecified, uncomplicated: Secondary | ICD-10-CM

## 2021-05-24 DIAGNOSIS — G43909 Migraine, unspecified, not intractable, without status migrainosus: Secondary | ICD-10-CM

## 2021-05-24 DIAGNOSIS — F32A Depression, unspecified: Secondary | ICD-10-CM

## 2021-05-24 DIAGNOSIS — I639 Cerebral infarction, unspecified: Secondary | ICD-10-CM | POA: Diagnosis present

## 2021-05-24 LAB — LIPID PANEL
Cholesterol: 166 mg/dL (ref 0–200)
HDL: 48 mg/dL (ref >40)
LDL Cholesterol: 99 mg/dL (ref 0–99)
Total CHOL/HDL Ratio: 3.5 RATIO
Triglycerides: 97 mg/dL (ref <150)
VLDL: 19 mg/dL (ref 0–40)

## 2021-05-24 LAB — PHOSPHORUS: Phosphorus: 3.4 mg/dL (ref 2.5–4.6)

## 2021-05-24 LAB — URINE DRUG SCREEN, QUALITATIVE (ARMC ONLY)
Amphetamines, Ur Screen: NOT DETECTED
Barbiturates, Ur Screen: NOT DETECTED
Benzodiazepine, Ur Scrn: NOT DETECTED
Cannabinoid 50 Ng, Ur ~~LOC~~: NOT DETECTED
Cocaine Metabolite,Ur ~~LOC~~: NOT DETECTED
MDMA (Ecstasy)Ur Screen: NOT DETECTED
Methadone Scn, Ur: NOT DETECTED
Opiate, Ur Screen: NOT DETECTED
Phencyclidine (PCP) Ur S: NOT DETECTED
Tricyclic, Ur Screen: NOT DETECTED

## 2021-05-24 LAB — APTT: aPTT: 34 seconds (ref 24–36)

## 2021-05-24 LAB — HIV ANTIBODY (ROUTINE TESTING W REFLEX): HIV Screen 4th Generation wRfx: NONREACTIVE

## 2021-05-24 LAB — ECHOCARDIOGRAM COMPLETE
AR max vel: 2.13 cm2
AV Area VTI: 2.23 cm2
AV Area mean vel: 2.02 cm2
AV Mean grad: 5 mmHg
AV Peak grad: 9.5 mmHg
Ao pk vel: 1.54 m/s
Area-P 1/2: 3.15 cm2
Height: 64 in
MV VTI: 2.61 cm2
S' Lateral: 3.75 cm
Weight: 4240 oz

## 2021-05-24 LAB — TSH: TSH: 3.216 u[IU]/mL (ref 0.350–4.500)

## 2021-05-24 LAB — PROTIME-INR
INR: 1 (ref 0.8–1.2)
Prothrombin Time: 12.8 seconds (ref 11.4–15.2)

## 2021-05-24 LAB — MAGNESIUM: Magnesium: 2 mg/dL (ref 1.7–2.4)

## 2021-05-24 LAB — RESP PANEL BY RT-PCR (FLU A&B, COVID) ARPGX2
Influenza A by PCR: NEGATIVE
Influenza B by PCR: NEGATIVE
SARS Coronavirus 2 by RT PCR: NEGATIVE

## 2021-05-24 LAB — TROPONIN I (HIGH SENSITIVITY): Troponin I (High Sensitivity): <2 ng/L (ref <18)

## 2021-05-24 LAB — VITAMIN D 25 HYDROXY (VIT D DEFICIENCY, FRACTURES): Vit D, 25-Hydroxy: 50.2 ng/mL (ref 30–100)

## 2021-05-24 LAB — VITAMIN B12: Vitamin B-12: 430 pg/mL (ref 180–914)

## 2021-05-24 LAB — ETHANOL: Alcohol, Ethyl (B): <10 mg/dL (ref <10)

## 2021-05-24 LAB — HEMOGLOBIN A1C
Hgb A1c MFr Bld: 5.7 % — ABNORMAL HIGH (ref 4.8–5.6)
Mean Plasma Glucose: 117 mg/dL

## 2021-05-24 MED ORDER — ACETAMINOPHEN 325 MG PO TABS
650.0000 mg | ORAL_TABLET | ORAL | Status: DC | PRN
  Administered 2021-05-24 (×2): 650 mg via ORAL
  Filled 2021-05-24 (×2): qty 2

## 2021-05-24 MED ORDER — SENNOSIDES-DOCUSATE SODIUM 8.6-50 MG PO TABS: 1.0000 | ORAL_TABLET | Freq: Every evening | ORAL | Status: DC | PRN

## 2021-05-24 MED ORDER — MELATONIN 5 MG PO TABS
5.0000 mg | ORAL_TABLET | Freq: Every evening | ORAL | Status: DC | PRN
  Filled 2021-05-24: qty 1

## 2021-05-24 MED ORDER — LORAZEPAM 1 MG PO TABS
1.0000 mg | ORAL_TABLET | Freq: Three times a day (TID) | ORAL | Status: AC | PRN
  Administered 2021-05-24: 23:00:00 1 mg via ORAL
  Filled 2021-05-24: qty 1

## 2021-05-24 MED ORDER — PANTOPRAZOLE SODIUM 40 MG PO TBEC
40.0000 mg | DELAYED_RELEASE_TABLET | Freq: Every day | ORAL | Status: DC
  Administered 2021-05-24 – 2021-05-25 (×2): 40 mg via ORAL
  Filled 2021-05-24 (×2): qty 1

## 2021-05-24 MED ORDER — ASPIRIN EC 81 MG PO TBEC
81.0000 mg | DELAYED_RELEASE_TABLET | Freq: Every day | ORAL | Status: DC
  Administered 2021-05-24 – 2021-05-25 (×2): 81 mg via ORAL
  Filled 2021-05-24 (×2): qty 1

## 2021-05-24 MED ORDER — LABETALOL HCL 5 MG/ML IV SOLN: 5.0000 mg | INTRAVENOUS | Status: AC | PRN

## 2021-05-24 MED ORDER — ACETAMINOPHEN 160 MG/5ML PO SOLN
650.0000 mg | ORAL | Status: DC | PRN
  Filled 2021-05-24: qty 20.3

## 2021-05-24 MED ORDER — ATORVASTATIN CALCIUM 20 MG PO TABS
40.0000 mg | ORAL_TABLET | Freq: Every evening | ORAL | Status: DC
  Administered 2021-05-24 – 2021-05-25 (×2): 40 mg via ORAL
  Filled 2021-05-24 (×2): qty 2

## 2021-05-24 MED ORDER — STROKE: EARLY STAGES OF RECOVERY BOOK: Freq: Once | Status: DC

## 2021-05-24 MED ORDER — GADOBUTROL 1 MMOL/ML IV SOLN
12.0000 mL | Freq: Once | INTRAVENOUS | Status: AC | PRN
  Administered 2021-05-24: 10 mL via INTRAVENOUS
  Filled 2021-05-24: qty 15

## 2021-05-24 MED ORDER — ASPIRIN 81 MG PO CHEW
324.0000 mg | CHEWABLE_TABLET | Freq: Once | ORAL | Status: AC
  Administered 2021-05-24: 324 mg via ORAL
  Filled 2021-05-24: qty 4

## 2021-05-24 MED ORDER — ACETAMINOPHEN 650 MG RE SUPP: 650.0000 mg | RECTAL | Status: DC | PRN

## 2021-05-24 MED ORDER — ENOXAPARIN SODIUM 60 MG/0.6ML IJ SOSY
0.5000 mg/kg | PREFILLED_SYRINGE | INTRAMUSCULAR | Status: DC
  Administered 2021-05-25: 09:00:00 60 mg via SUBCUTANEOUS
  Filled 2021-05-24: qty 0.6

## 2021-05-24 MED ORDER — OXYCODONE HCL 5 MG PO TABS
5.0000 mg | ORAL_TABLET | Freq: Four times a day (QID) | ORAL | Status: DC | PRN
  Administered 2021-05-24: 5 mg via ORAL
  Filled 2021-05-24: qty 1

## 2021-05-24 MED ORDER — PERFLUTREN LIPID MICROSPHERE
1.0000 mL | INTRAVENOUS | Status: AC | PRN
Start: 2021-05-24 — End: 2021-05-24
  Administered 2021-05-24: 3 mL via INTRAVENOUS
  Filled 2021-05-24: qty 10

## 2021-05-24 NOTE — Evaluation (Signed)
Occupational Therapy Evaluation Patient Details Name: Tanya Henderson MRN: [id number] DOB: 09-14-1975 Today's Date: 05/24/2021   History of Present Illness Patient is a 45 year old female with with medical history significant for hypertension, hyperlipidemia, depression, anxiety, GERD, morbid obesity, snores, empty sella, former tobacco user, who presents emergency department from work for chief concerns of right-sided weakness. MRI of the head and neck with and without contrast showed area of early subacute ischemia in the left caudate tail. Etiology work-up ongoing.   Clinical Impression   Pt seen for OT evaluation this date. Prior to hospital admission, pt was independent in all aspects of ADL/IADL, working as a Lawyer at [employer], and driving. Pt lives with mother and Irena Reichmann adult son in a level entry home. Currently pt demonstrates mild FMC and strength impairments on the R side (RUE>RLE) which is impairing her ability to perform mobility and ADL tasks at baseline independence. Pt demonstrates modified independence with bed mobility, requiring increased time/effort for task involving RUE. Pt denies visual, cognitive, and sensory deficits. Pt/family instructed in [facility] activities and exercises to perform and encouraged increasing RUE use into daily tasks she may otherwise use her dominant LUE for. Pt educated in stroke recovery and encouraged pt to address driving and return to work with the MD. Pt verbalizes understanding but would benefit from additional skilled OT services to maximize recall and carryover of learned strategies as well as to maximize return to PLOF/independence, minimize falls risk, and minimize caregiver burden. Recommend Outpatient OT services upon discharge.    Recommendations for follow up therapy are one component of a multi-disciplinary discharge planning process, led by the attending physician.  Recommendations may be updated based on patient status, additional functional criteria  and insurance authorization.   Follow Up Recommendations  Outpatient OT    Assistance Recommended at Discharge PRN  Functional Status Assessment  Patient has had a recent decline in their functional status and demonstrates the ability to make significant improvements in function in a reasonable and predictable amount of time.  Equipment Recommendations  None recommended by OT    Recommendations for Other Services       Precautions / Restrictions Precautions Precautions: Fall Restrictions Weight Bearing Restrictions: No      Mobility Bed Mobility Overal bed mobility: Modified Independent             General bed mobility comments: not observed as patient sitting up on arrival and post session    Transfers Overall transfer level: Modified independent                 General transfer comment: increased time required. no physical assistance needed      Balance Overall balance assessment: Needs assistance Sitting-balance support: No upper extremity supported;Feet supported Sitting balance-Leahy Scale: Good     Standing balance support: During functional activity;No upper extremity supported Standing balance-Leahy Scale: Good Standing balance comment: patient is able to reach outside base of support without UE support, reach overhead with no loss of balance. dyanmic activity with turns while ambulation without loss of balance                           ADL either performed or assessed with clinical judgement   ADL Overall ADL's : Modified independent  General ADL Comments: Pt able to perform UB/LB dressing and toileting with increased time/effort 2/2 impaired FMC RUE>RLE. Supervision/mod indep for ADL transfers     Vision Baseline Vision/History: 0 No visual deficits Ability to See in Adequate Light: 0 Adequate Patient Visual Report: No change from baseline Vision Assessment?: No apparent  visual deficits     Perception     Praxis      Pertinent Vitals/Pain Pain Assessment: No/denies pain     Hand Dominance Left   Extremity/Trunk Assessment Upper Extremity Assessment Upper Extremity Assessment: RUE deficits/detail RUE Deficits / Details: shoulder flexion 4+/5, shoulder abduction 4/5, elbow extension 4/5, elbow flexion 4-/5, grip 4/5; FMC impairments with finger to nose and thumb opposition testing RUE Sensation: WNL RUE Coordination: decreased fine motor;decreased gross motor LUE Deficits / Details: WNL LUE Sensation: WNL LUE Coordination: WNL   Lower Extremity Assessment Lower Extremity Assessment: RLE deficits/detail;LLE deficits/detail RLE Deficits / Details: hip add/abd 5/5, knee extension 5/5, ankle dorsiflexion/plantarflexion 5/5 RLE Sensation: WNL RLE Coordination:  (alternating foot tapping symmetrical) LLE Deficits / Details: WNL       Communication Communication Communication: No difficulties   Cognition Arousal/Alertness: Awake/alert Behavior During Therapy: WFL for tasks assessed/performed Overall Cognitive Status: Within Functional Limits for tasks assessed                                 General Comments: patient is alert and oriented x 4. patient is able to follow all commands without difficulty     General Comments       Exercises Other Exercises Other Exercises: Pt/family instructed in [facility] activities and exercises to perform and encouraged increasing RUE use into daily tasks she may otherwise use her dominant LUE for. 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)             Mobility Comments: independent with no assistive device ADLs Comments: independent        OT Problem List: Decreased coordination;Decreased strength;Decreased activity tolerance;Decreased knowledge of use of DME or AE;Impaired UE functional use      OT Treatment/Interventions: Self-care/ADL training;Therapeutic exercise;Therapeutic activities;Neuromuscular education;DME and/or AE instruction;Patient/family education    OT Goals(Current goals can be found in the care plan section) Acute Rehab OT Goals Patient Stated Goal: return to work and driving OT Goal Formulation: With patient/family Time For Goal Achievement: 06/07/21 Potential to Achieve Goals: Good ADL Goals Pt Will Transfer to Toilet: with modified independence;ambulating (LRAD PRN) Additional ADL Goal #1: Pt will perform morning ADL routine with modified independence Additional ADL Goal #2: Pt will independently perform RUE exercises program with written handout Additional ADL Goal #3: Pt will independently perform [facility] ex/activities assigned with handout  OT Frequency: Min 3X/week   Barriers to D/C:  Co-evaluation              AM-PAC OT "6 Clicks" Daily Activity     Outcome Measure Help from another person eating meals?: None Help from another person taking care of personal grooming?: A Little Help from another person toileting, which includes using toliet, bedpan, or urinal?: A Little Help from another person bathing (including washing, rinsing, drying)?: A Little Help from another person to put on and taking off regular upper body clothing?: None Help from another person to put on and taking off regular lower  body clothing?: None 6 Click Score: 21   End of Session    Activity Tolerance: Patient tolerated treatment well Patient left: in bed;with call bell/phone within reach;with family/visitor present (seated EOB per pt's request)  OT Visit Diagnosis: Other abnormalities of gait and mobility (R26.89);Hemiplegia and hemiparesis Hemiplegia - Right/Left: Right Hemiplegia - dominant/non-dominant: Non-Dominant Hemiplegia - caused by: Cerebral infarction                Time: 4656-8127 OT Time Calculation (min): 21 min Charges:  OT General Charges $OT Visit: 1 Visit OT Evaluation $OT Eval Low Complexity: 1 Low OT Treatments $Neuromuscular Re-education: 8-22 mins  Arman Filter., MPH, MS, OTR/L ascom [phone] 05/24/21, 3:47 PM

## 2021-05-24 NOTE — ED Notes (Signed)
Patient is resting comfortably. Ambulating to bathroom with standby assistance. Denies further needs at this time.

## 2021-05-24 NOTE — Progress Notes (Signed)
Neurology progress note  Patient ID: 45 yo woman with pmhx anxiety, depression, kidney stones, HTN, tobacco abuse presented to ED last night with RUE and RLE weakness. She had drift in both extremities without other symptoms and had NIHSS = 2, therefore TNK was not administered 2/2 absence of disabling sx.  Stroke workup this hospitalization:  MRI brain without contrast: early subacute ischemia in L caudate tail  MRA head: WNL  MRA neck: pending  TTE: no intracardiac clot  Stroke Labs     Component Value Date/Time   CHOL 166 05/24/2021 0642   TRIG 97 05/24/2021 0642   HDL 48 05/24/2021 0642   CHOLHDL 3.5 05/24/2021 0642   VLDL 19 05/24/2021 0642   LDLCALC 99 05/24/2021 0642    No results found for: HGBA1C  O:  Vitals:   05/24/21 1130 05/24/21 1430  BP: 126/82 129/75  Pulse: 62 75  Resp: 11 16  Temp:    SpO2: 99% 97%   Gen: A&Ox4, follows commands, NAD CV: RRR Resp: CTAB  Neurologic MS: A&Ox4, follows simple commands Speech: minimal dysarthria, can name and repeat CN: PERRL, VFF, EOMI, sensation intact, R UMN facial droop, hearing intact Motor: subtle drift RUE and RLE, LUE and LLE full strength Sensory: SILT Reflexes: 2+ symmetric throughout Coordination: FNF intact bilat Gait: deferred  A/P: 45 yo woman with pmhx anxiety, depression, kidney stones, HTN, tobacco abuse presented to ED last night with RUE and RLE weakness and 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 Given her relative paucity of risk factors (she quit smoking 2.5 yrs ago) will order hypercoag workup and make sure she gets a bubble study (was not performed with original TTE). - Hypercoag workup - Bubble study add on - Permissive HTN x48 hrs from sx onset goal BP <220/110. PRN labetalol or hydralazine if BP above these parameters. Avoid oral antihypertensives. After 48 hours goal is normotension, strict avoidance of hypotension. - Check A1c and LDL + add statin per guidelines - ASA 81mg  daily +  plavix 75mg  daily x21 days f/b ASA 81mg  daily monotherapy after that - q4 hr neuro checks - STAT head CT for any change in neuro exam - Tele - PT/OT/SLP - Stroke education - Amb referral to neurology upon discharge  Will follow  , MD [facility] [phone]  If 7pm- 7am, please page neurology on call as listed in AMION.

## 2021-05-24 NOTE — Progress Notes (Signed)
*  PRELIMINARY RESULTS* Echocardiogram 2D Echocardiogram has been performed.  Tanya Henderson 05/24/2021, 11:09 AM

## 2021-05-24 NOTE — Progress Notes (Signed)
Patient concerned about not taking her BP meds. Per MD, BP meds being held right now. Patient made aware. Patient also c/o dull 3 out of 10 headache on left side of head. MD made aware. Will administer PRN tylenol.

## 2021-05-24 NOTE — Evaluation (Signed)
Physical Therapy Evaluation Patient Details Name: Tanya Henderson MRN: [id number] DOB: Apr 26, 1976 Today's Date: 05/24/2021  History of Present Illness  Patient is a 45 year old female with with medical history significant for hypertension, hyperlipidemia, depression, anxiety, GERD, morbid obesity, snores, empty sella, former tobacco user, who presents emergency department from work for chief concerns of right-sided weakness. MRI of the head and neck with and without contrast showed area of early subacute ischemia in the left caudate tail. Etiology work-up ongoing.   Clinical Impression  Patient is agreeable to PT evaluation. 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 She lives with her mom and son.  On exam, patient has decreased strength with RUE with manual muscle testing. Sensation appears intact throughout but she does demonstrate dysmetria with RUE. The RLE is 5/5 with manual muscle testing, however she does fatigue with ambulation with gait deficits present and more pronounced with standing activity. She has decreased foot clearance of RLE with longer distance ambulation and requires occasional cues for safety. No physical assistance or assistive device required with mobility and ambulation. The patient is not at her baseline level of functional mobility. Recommend PT follow up to maximize independence and facilitate return to prior level of function. Anticipate patient will be able to return home when medically ready with outpatient PT recommended at discharge.    Recommendations for follow up therapy are one component of a multi-disciplinary discharge planning process, led by the attending physician.  Recommendations may be updated based on patient status, additional functional criteria and insurance authorization.  Follow Up Recommendations Outpatient PT    Assistance Recommended at Discharge  PRN  Functional Status Assessment Patient has had a recent decline in their functional status and demonstrates the ability to make significant improvements in function in a reasonable and predictable amount of time.  Equipment Recommendations  None recommended by PT    Recommendations for Other Services       Precautions / Restrictions Precautions Precautions: Fall Restrictions Weight Bearing Restrictions: No      Mobility  Bed Mobility               General bed mobility comments: not observed as patient sitting up on arrival and post session    Transfers Overall transfer level: Modified independent                 General transfer comment: increased time required. no physical assistance needed    Ambulation/Gait Ambulation/Gait assistance: Min guard;Modified independent (Device/Increase time) Gait Distance (Feet): 250 Feet Assistive device: None Gait Pattern/deviations: Step-through pattern;Decreased step length - right;Wide base of support Gait velocity: decreased     General Gait Details: with short distance ambulation, patient has wide base of support with no loss of balance. with longer distance ambulation, patient has decreased foot clearance of RLE and needs cues for safety. no loss of balance, however occasional Min guard provided with decreased foot clearance episodes, more pronounced towards end of walking bout  Stairs            Wheelchair Mobility    Modified Rankin (Stroke Patients Only)       Balance Overall balance assessment: Needs assistance Sitting-balance support: No upper extremity supported;Feet supported Sitting balance-Leahy Scale: Good     Standing balance support: During functional activity;No upper extremity supported Standing balance-Leahy Scale: Good Standing balance comment: patient is able to reach outside base of  support without UE support, reach overhead with no loss of balance. 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 RUE Sensation: WNL RUE Coordination: decreased fine motor;decreased gross motor (finger to nose with dysmetira, rapid alternating movements delayed with RUE) LUE Deficits / Details: WNL LUE Sensation: WNL LUE Coordination: WNL    Lower Extremity Assessment Lower Extremity Assessment: RLE deficits/detail;LLE deficits/detail RLE Deficits / Details: hip add/abd 5/5, knee extension 5/5, ankle dorsiflexion/plantarflexion 5/5 (patient has gait deficits, likely related to fatigue with prolonged activity) RLE Sensation: WNL RLE Coordination:  (alternating foot tapping symmetrical) LLE Deficits / Details: WNL       Communication   Communication: No difficulties  Cognition Arousal/Alertness: Awake/alert Behavior During Therapy: WFL for tasks assessed/performed Overall Cognitive Status: Within Functional Limits for tasks assessed                                 General Comments: patient is alert and oriented x 4. patient is able to  follow all commands without difficulty        General Comments      Exercises     Assessment/Plan    PT Assessment Patient needs continued PT services  PT Problem List Decreased strength;Decreased range of motion;Decreased activity tolerance;Decreased balance;Decreased mobility;Decreased coordination;Decreased safety awareness       PT Treatment Interventions Gait training;DME instruction;Stair training;Functional mobility training;Therapeutic activities;Therapeutic exercise;Balance training;Cognitive remediation;Neuromuscular re-education;Patient/family education    PT Goals (Current goals can be found in the Care Plan section)  Acute Rehab PT Goals Patient Stated Goal: to regain strength in her right side PT Goal Formulation: With patient Time For Goal Achievement: 06/07/21 Potential to Achieve Goals: Good    Frequency 7X/week   Barriers to discharge        Co-evaluation               AM-PAC PT "6 Clicks" Mobility  Outcome Measure Help needed turning from your back to your side while in a flat bed without using bedrails?: None Help needed moving from lying on your back to sitting on the side of a flat bed without using bedrails?: None Help needed moving to and from a bed to a chair (including a wheelchair)?: None Help needed standing up from a chair using your arms (e.g., wheelchair or bedside chair)?: None Help needed to walk in hospital room?: A Little Help needed climbing 3-5 steps  with a railing? : A Little 6 Click Score: 22    End of Session   Activity Tolerance: Patient tolerated treatment well Patient left: with family/visitor present (sitting up on edge of bed, eating lunch) Nurse Communication: Mobility status PT Visit Diagnosis: Muscle weakness (generalized) (M62.81);Other abnormalities of gait and mobility (R26.89)    Time: 6195-0932 PT Time Calculation (min) (ACUTE ONLY): 31 min   Charges:   PT Evaluation $PT Eval Low Complexity: 1 Low PT  Treatments $Gait Training: 8-22 mins        Donna Bernard, PT, MPT  [employer] 05/24/2021, 1:55 PM

## 2021-05-24 NOTE — H&P (Signed)
History and Physical   Tanya Henderson FYB:[id number] DOB: 1975-12-12 DOA: 05/23/2021  PCP: Barbette Reichmann, MD  Outpatient Specialists: Dr. Christeen Douglas, OB/GYN Patient coming from: Work  I have personally briefly reviewed patient's old medical records in [facility] EMR.  Chief Concern: Right-sided weakness  HPI: Tanya Henderson is a left handed 45 y.o. female with medical history significant for hypertension, hyperlipidemia, depression, anxiety, GERD, morbid obesity, snores, empty sella, former tobacco user, who presents emergency department from work for chief concerns of right-sided weakness and weird feeling.  At bedside, she is able to me her name, age, current lcoation, current calendar year.   Tanya Henderson states that when she was at work, she was walking to help her coworker with a patient when she developed sudden onset of right lower extremity weakness and then this progressed to right arm 'floppiness'. She reports the symptoms are still there. She has never felt this way before. She reports the symptoms are mildly betters. She denies vision changes, chest pain. She reports that for the last two weeks, she \\has  been feeling on/off shortness of breath.   She reports that in the last few months, her she has been having difficulty with her memory, specially not remembering her coworker's name correctly. She denies seizure activity, urinary and/or bowel incontinence.   Social history: She lives at home with her mother and her son. She is a former smoker, quiting two years ago. At her peak, she smoked 1ppd. She started smoking in her mid twenties. She endorses infrequent etoh, last drink as Saturday evening and it was one glass of wine. She denies recreational drug use. She works at [employer] as a Lawyer.   Vaccination history: She is vaccinate for covid 19, all vaccines except for the most recent booster.   ROS: Constitutional: no weight change, no fever Henderson/Mouth: no sore throat, no  rhinorrhea Eyes: no eye pain, no vision changes Cardiovascular: no chest pain, no dyspnea,  no edema, no palpitations Respiratory: no cough, no sputum, no wheezing Gastrointestinal: no nausea, no vomiting, no diarrhea, no constipation Genitourinary: no urinary incontinence, no dysuria, no hematuria Musculoskeletal: no arthralgias, no myalgias Skin: no skin lesions, no pruritus, Neuro: + weakness, no loss of consciousness, no syncope Psych: no anxiety, no depression, + decrease appetite Heme/Lymph: no bruising, no bleeding  ED Course: Discussed with emergency medicine provider, patient requiring hospitalization for chief concerns of acute stroke.  Vitals in the emergency department was remarkable for temperature of 97.9, respiration rate of 18, heart rate of 84, initial blood pressure 134/78, SPO2 of 97% on room air, serum sodium 135, potassium 4.0, chloride 105, bicarb 25, BUN of 11, serum creatinine of 0.73, nonfasting blood glucose of 100, WBC of 8.8, hemoglobin 13.9, platelets 316, GFR 60.  UA was negative for nitrates or leukocytes.  Urine pregnancy test was negative.  In the emergency department patient was given aspirin 324 mg p.o. once.  ED provider consulted telemetry neurologist recommend admission to the medicine service.  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 team to consult formally -  Complete echo ordered - MRI of the head and neck with and without contrast showed area of early subacute ischemia in  the left caudate tail, in keeping with reported right-sided weakness.  No hemorrhage or mass-effect.  Normal intracranial MRA. - Fasting lipid, A1c, B12, TSH, magnesium, phosphorus, HIV ordered - Permissive hypertension with labetalol 5 mg IV to 2 hours.  For SBP greater than 180, 1 day ordered - Frequent neuro vascular checks - Heart healthy - PT, OT - Fall precaution   # Snoring presumed secondary to undiagnosed OSA-CPAP nightly ordered - She has an upcoming sleep study in December - Extensive counseling with patient regarding keeping her sleep study exam and following CPAP compliance - I explained to patient that should she lose weight, she would not likely need another sleep study to assess her sleep apneic status  # Hypertension-patient takes losartan 25 mg daily, this has been held at this time to allow for permissive hypertension  # Morbid obesity-recommend work with outpatient PCP for healthy weight loss  # Depression/anxiety-patient takes sertraline 50 mg daily  # GERD-PPI  # Empty sella  # COVID/influenza A/influenza B PCR has been ordered and pending collection  Chart reviewed.   DVT prophylaxis: Enoxaparin Code Status: Full code Diet: Heart healthy Family Communication: no Disposition Plan: Pending clinical course, anticipate less than 2 night stay Consults called: Telemetry neurology via EDP Admission status: Telemetry medical, observation  Past Medical History:  Diagnosis Date   Anxiety    Bronchitis    Chronic kidney disease    Kidney stones   Depression    GERD (gastroesophageal reflux disease)    Headache    Renal calculi    Vitamin D deficiency    Past Surgical History:  Procedure Laterality Date   CHOLECYSTECTOMY     CYSTOSCOPY W/ URETERAL STENT PLACEMENT Right 09/21/2015   Procedure: CYSTOSCOPY WITH STENT REPLACEMENT;  Surgeon: Hollice Espy, MD;  Location: [facility];  Service: Urology;  Laterality: Right;   CYSTOSCOPY W/ URETERAL STENT REMOVAL  Right 02/10/2015   Procedure: CYSTOSCOPY WITH STENT REMOVAL;  Surgeon: Collier Flowers, MD;  Location: [facility];  Service: Urology;  Laterality: Right;   CYSTOSCOPY WITH STENT PLACEMENT Right 01/29/2015   Procedure: CYSTOSCOPY WITH STENT PLACEMENT;  Surgeon: Ardis Hughs, MD;  Location: [facility];  Service: Urology;  Laterality: Right;   CYSTOSCOPY WITH STENT PLACEMENT Right 02/10/2015   Procedure: CYSTOSCOPY WITH STENT PLACEMENT;  Surgeon: Collier Flowers, MD;  Location: [facility];  Service: Urology;  Laterality: Right;   CYSTOSCOPY WITH STENT PLACEMENT Right 09/07/2015   Procedure: CYSTOSCOPY WITH STENT PLACEMENT;  Surgeon: Ardis Hughs, MD;  Location: [facility];  Service: Urology;  Laterality: Right;   CYSTOSCOPY/RETROGRADE/URETEROSCOPY/STONE EXTRACTION WITH BASKET  05/26/2015   Procedure: CYSTOSCOPY/RETROGRADE/URETEROSCOPY/STONE EXTRACTION WITH BASKET;  Surgeon: Hollice Espy, MD;  Location: [facility];  Service: Urology;;   CYSTOSCOPY/URETEROSCOPY/HOLMIUM LASER  05/26/2015   Procedure: CYSTOSCOPY/URETEROSCOPY/HOLMIUM LASER;  Surgeon: Hollice Espy, MD;  Location: [facility];  Service: Urology;;   EXTRACORPOREAL SHOCK WAVE LITHOTRIPSY Right 04/22/2015   Procedure: EXTRACORPOREAL SHOCK WAVE LITHOTRIPSY (ESWL);  Surgeon: Nickie Retort, MD;  Location: [facility];  Service: Urology;  Laterality: Right;   KIDNEY STONE SURGERY     OOPHORECTOMY Right 1996   Ovarian cyst   URETEROSCOPY WITH HOLMIUM LASER LITHOTRIPSY Right 02/10/2015   Procedure: URETEROSCOPY WITH HOLMIUM LASER LITHOTRIPSY;  Surgeon: Collier Flowers, MD;  Location: [facility];  Service: Urology;  Laterality: Right;   URETEROSCOPY WITH  HOLMIUM LASER LITHOTRIPSY Right 09/21/2015   Procedure: URETEROSCOPY WITH HOLMIUM LASER LITHOTRIPSY/ fragmentation and removal;  Surgeon: Vanna Scotland, MD;  Location: [facility];  Service: Urology;  Laterality: Right;   Social History:  reports that she has quit smoking. She has never used smokeless  tobacco. She reports current alcohol use. She reports that she does not use drugs.  Allergies  Allergen Reactions   Clindamycin/Lincomycin    Penicillins Other (See Comments)    Reaction:  Unknown    Family History  Problem Relation Age of Onset   Lupus Mother    Hypertension Mother    Nephrolithiasis Paternal Grandfather    Breast cancer Neg Hx    Family history: Family history reviewed and not pertinent  Prior to Admission medications   Medication Sig Start Date End Date Taking? Authorizing Provider  BIOTIN PO Take by mouth daily.    [provider]  ibuprofen (ADVIL,MOTRIN) 400 MG tablet Take 800 mg by mouth every 6 (six) hours as needed for headache or mild pain.     [provider]  losartan (COZAAR) 25 MG tablet Take 1 tablet (25 mg total) by mouth daily. 03/15/21 04/14/21  Orvil Feil, PA-C  omeprazole (PRILOSEC) 20 MG capsule Take 1 capsule (20 mg total) by mouth daily. 01/15/19   Johnna Acosta, NP  sertraline (ZOLOFT) 50 MG tablet Take 50 mg by mouth daily.    [provider]  VITAMIN D, CHOLECALCIFEROL, PO Take by mouth daily.    [provider]   Physical Exam: Vitals:   05/23/21 2110 05/23/21 2119  BP: 134/76   Pulse: 84   Resp: 18   Temp: 97.9 F (36.6 C)   TempSrc: Oral   SpO2: 97%   Weight:  120.2 kg  Height:  5\' 4"  (1.626 m)   Constitutional: appears age-appropriate, NAD, calm, comfortable Eyes: PERRL, lids and conjunctivae normal ENMT: Mucous membranes are moist. Posterior pharynx clear of any exudate or lesions. Age-appropriate dentition. Hearing appropriate Neck: normal, supple, no masses, no thyromegaly Respiratory: clear to auscultation bilaterally, no wheezing, no crackles. Normal respiratory effort. No accessory muscle use.  Cardiovascular: Regular rate and rhythm, no murmurs / rubs / gallops. No extremity edema. 2+ pedal pulses. No carotid bruits.  Abdomen: Morbidly obese abdomen no tenderness, no masses  palpated, no hepatosplenomegaly. Bowel sounds positive.  Musculoskeletal: no clubbing / cyanosis. No joint deformity upper and lower extremities. Good ROM, no contractures, no atrophy. Normal muscle tone.  Skin: no rashes, lesions, ulcers. No induration Neurologic: Sensation intact. Strength 5/5 in all upper and lower left-sided extremity.  Strength is mildly decreased in the bilateral upper and lower right-sided extremities.  Mild right-sided upper extremity pronator drift. Psychiatric: Normal judgment and insight. Alert and oriented x 3. Normal mood.   EKG: independently reviewed, showing sinus rhythm with rate of 71, QTc 417  CT of the head without contrast on Admission: I personally reviewed and I agree with radiologist reading as below.  CT HEAD WO CONTRAST ( )  Result Date: 05/23/2021 CLINICAL DATA:  Neuro deficit, acute, stroke suspected. Right arm and leg numbness EXAM: CT HEAD WITHOUT CONTRAST TECHNIQUE: Contiguous axial images were obtained from the base of the skull through the vertex without intravenous contrast. COMPARISON:  None. FINDINGS: Brain: Normal anatomic configuration. No abnormal intra or extra-axial mass lesion or fluid collection. No abnormal mass effect or midline shift. No evidence of acute intracranial hemorrhage or infarct. Ventricular size is normal. Cerebellum unremarkable. Vascular: Unremarkable Skull: Intact  Sinuses/Orbits: Paranasal sinuses are clear. Orbits are unremarkable. Other: Mastoid air cells and middle ear cavities are clear. IMPRESSION: No acute abnormality.  Normal examination. Electronically Signed   By: Fidela Salisbury M.D.   On: 05/23/2021 22:01   MR ANGIO HEAD WO CONTRAST  Result Date: 05/23/2021 CLINICAL DATA:  Seizure with right arm and leg numbness EXAM: MRI HEAD WITHOUT CONTRAST MRA HEAD WITHOUT CONTRAST TECHNIQUE: Multiplanar, multi-echo pulse sequences of the brain and surrounding structures were acquired without intravenous contrast.  Angiographic images of the Circle of Willis were acquired using MRA technique without intravenous contrast. COMPARISON:  None FINDINGS: MRI HEAD FINDINGS Brain: Area of early subacute ischemia in the left caudate tail. No acute or chronic hemorrhage. Normal white matter signal, parenchymal volume and CSF spaces. The midline structures are normal. Vascular: Major flow voids are preserved. Skull and upper cervical spine: Normal calvarium and skull base. Visualized upper cervical spine and soft tissues are normal. Sinuses/Orbits:No paranasal sinus fluid levels or advanced mucosal thickening. No mastoid or middle ear effusion. Normal orbits. MRA HEAD FINDINGS POSTERIOR CIRCULATION: --Vertebral arteries: Diminutive right V4 segment.  Normal left. --Inferior cerebellar arteries: Normal. --Basilar artery: Normal. --Superior cerebellar arteries: Normal. --Posterior cerebral arteries: Normal. ANTERIOR CIRCULATION: --Intracranial internal carotid arteries: Normal. --Anterior cerebral arteries (ACA): Normal. --Middle cerebral arteries (MCA): Normal. ANATOMIC VARIANTS: None IMPRESSION: 1. Area of early subacute ischemia in the left caudate tail, in keeping with reported right-sided weakness. No hemorrhage or mass effect. 2. Normal intracranial MRA. Electronically Signed   By: Ulyses Jarred M.D.   On: 05/23/2021 23:58   MR BRAIN WO CONTRAST  Result Date: 05/23/2021 CLINICAL DATA:  Seizure with right arm and leg numbness EXAM: MRI HEAD WITHOUT CONTRAST MRA HEAD WITHOUT CONTRAST TECHNIQUE: Multiplanar, multi-echo pulse sequences of the brain and surrounding structures were acquired without intravenous contrast. Angiographic images of the Circle of Willis were acquired using MRA technique without intravenous contrast. COMPARISON:  None FINDINGS: MRI HEAD FINDINGS Brain: Area of early subacute ischemia in the left caudate tail. No acute or chronic hemorrhage. Normal white matter signal, parenchymal volume and CSF spaces. The  midline structures are normal. Vascular: Major flow voids are preserved. Skull and upper cervical spine: Normal calvarium and skull base. Visualized upper cervical spine and soft tissues are normal. Sinuses/Orbits:No paranasal sinus fluid levels or advanced mucosal thickening. No mastoid or middle ear effusion. Normal orbits. MRA HEAD FINDINGS POSTERIOR CIRCULATION: --Vertebral arteries: Diminutive right V4 segment.  Normal left. --Inferior cerebellar arteries: Normal. --Basilar artery: Normal. --Superior cerebellar arteries: Normal. --Posterior cerebral arteries: Normal. ANTERIOR CIRCULATION: --Intracranial internal carotid arteries: Normal. --Anterior cerebral arteries (ACA): Normal. --Middle cerebral arteries (MCA): Normal. ANATOMIC VARIANTS: None IMPRESSION: 1. Area of early subacute ischemia in the left caudate tail, in keeping with reported right-sided weakness. No hemorrhage or mass effect. 2. Normal intracranial MRA. Electronically Signed   By: Ulyses Jarred M.D.   On: 05/23/2021 23:58    Labs on Admission: I have personally reviewed following labs  CBC: Recent Labs  Lab 05/23/21 2213  WBC 8.8  HGB 13.9  HCT 42.1  MCV 90.1  PLT 123XX123   Basic Metabolic Panel: Recent Labs  Lab 05/23/21 2213  NA 135  K 4.0  CL 105  CO2 25  GLUCOSE 100*  BUN 11  CREATININE 0.73  CALCIUM 9.1   GFR: Estimated Creatinine Clearance: 112.2 mL/min (by C-G formula based on SCr of 0.73 mg/dL).  Liver Function Tests: Recent Labs  Lab 05/23/21 2213  AST 48*  ALT 74*  ALKPHOS 161*  BILITOT 0.6  PROT 7.9  ALBUMIN 4.1   Urine analysis:    Component Value Date/Time   COLORURINE YELLOW (A) 05/23/2021 2214   APPEARANCEUR CLEAR (A) 05/23/2021 2214   APPEARANCEUR Cloudy (A) 03/27/2016 1541   LABSPEC 1.020 05/23/2021 2214   PHURINE 5.0 05/23/2021 2214   GLUCOSEU NEGATIVE 05/23/2021 2214   HGBUR LARGE (A) 05/23/2021 2214   BILIRUBINUR NEGATIVE 05/23/2021 2214   BILIRUBINUR Negative 03/27/2016 1541    KETONESUR NEGATIVE 05/23/2021 2214   PROTEINUR NEGATIVE 05/23/2021 2214   UROBILINOGEN 0.2 07/24/2009 0934   NITRITE NEGATIVE 05/23/2021 2214   LEUKOCYTESUR NEGATIVE 05/23/2021 2214   Dr. Tobie Poet [facility]  If 7PM-7AM, please contact overnight-coverage provider If 7AM-7PM, please contact day coverage provider [email or web address]  05/24/2021, 1:01 AM

## 2021-05-24 NOTE — ED Notes (Signed)
Called Teleneuro for recommendations per Don Perking, MD

## 2021-05-24 NOTE — Plan of Care (Signed)
Patient was seen and examined in the ED, patient feels weakness in the right side, no numbness.  CN grossly intact, no difficulty in speech and no problem in the vision  2D echocardiogram did not show any significant findings, bubble study was not done which is pending cardiology was consulted again. MRA neck is pending Neurology following, patient will need hypercoagulable work-up We will continue current management

## 2021-05-24 NOTE — Progress Notes (Signed)
Anticoagulation monitoring(Lovenox):  45 yo female ordered Lovenox 40 mg Q24h    Filed Weights   05/23/21 2119  Weight: 120.2 kg (265 lb)   BMI 45   Lab Results  Component Value Date   CREATININE 0.73 05/23/2021   CREATININE 0.79 03/15/2021   CREATININE 1.19 (H) 09/06/2015   Estimated Creatinine Clearance: 112.2 mL/min (by C-G formula based on SCr of 0.73 mg/dL). Hemoglobin & Hematocrit     Component Value Date/Time   HGB 13.9 05/23/2021 2213   HGB 14.7 03/24/2013 1809   HCT 42.1 05/23/2021 2213   HCT 43.6 03/24/2013 1809     Per Protocol for Patient with estCrcl > 30 ml/min and BMI > 30, will transition to Lovenox 60 mg Q24h.

## 2021-05-24 NOTE — ED Provider Notes (Signed)
Accepted care of this patient from Dr. Fuller Plan at 11:30 PM.  Patient currently at MRI.  Patient made a code stroke for right-sided weakness.  Initial NIH stroke scale of 0.  Once patient arrived back to the room I repeated neurological exam.  She has a slight right upper extremity pronator drift but otherwise neurologically intact.  Has been seen by neurology.  Last seen normal 8 PM.  Awaiting to hear back from neurology for recommendations.  I did look at patient's MRI and it seems like she does have an acute ischemic stroke but he has not been formally read by radiology yet.  Aspirin ordered.   NIH Stroke Scale  Interval: Sign out Time: 12:12 AM Person Administering Scale: [place]  Administer stroke scale items in the order listed. Record performance in each category after each subscale exam. Do not go back and change scores. Follow directions provided for each exam technique. Scores should reflect what the patient does, not what the clinician thinks the patient can do. The clinician should record answers while administering the exam and work quickly. Except where indicated, the patient should not be coached (i.e., repeated requests to patient to make a special effort).   1a  Level of consciousness: 0=alert; keenly responsive  1b. LOC questions:  0=Performs both tasks correctly  1c. LOC commands: 0=Performs both tasks correctly  2.  Best Gaze: 0=normal  3.  Visual: 0=No visual loss  4. Facial Palsy: 0=Normal symmetric movement  5a.  Motor left arm: 0=No drift, limb holds 90 (or 45) degrees for full 10 seconds  5b.  Motor right arm: 1=Drift, limb holds 90 (or 45) degrees but drifts down before full 10 seconds: does not hit bed  6a. motor left leg: 0=No drift, limb holds 90 (or 45) degrees for full 10 seconds  6b  Motor right leg:  0=No drift, limb holds 90 (or 45) degrees for full 10 seconds  7. Limb Ataxia: 0=Absent  8.  Sensory: 0=Normal; no sensory loss  9. Best Language:  0=No  aphasia, normal  10. Dysarthria: 0=Normal  11. Extinction and Inattention: 0=No abnormality   Total:   1     _________________________ 12:03 AM on 05/24/2021 ----------------------------------------- MRI just read showing a left caudate subacute stroke.  We have been trying to reach the neurologist for recommendations for the last 30 minutes.  We will continue to try. Exam is unchanged  _________________________ 12:18 AM on 05/24/2021 ----------------------------------------- Spoke with neurologist who recommended full dose ASA and admission for stroke work up./      Don Perking, [place], MD 05/24/21 [phone]

## 2021-05-24 NOTE — ED Notes (Signed)
Pt sitting up in bed, pt awake and alert, pt denies pain, resps even and unlabored.  Meal tray given. Md notified of NIH score,

## 2021-05-25 ENCOUNTER — Observation Stay (HOSPITAL_BASED_OUTPATIENT_CLINIC_OR_DEPARTMENT_OTHER)
Admit: 2021-05-25 | Discharge: 2021-05-25 | Disposition: A | Discharge status: Home / Self Care | Payer: Commercial Managed Care - PPO | Attending: Student | Admitting: Student

## 2021-05-25 ENCOUNTER — Other Ambulatory Visit: Payer: Self-pay

## 2021-05-25 DIAGNOSIS — Z87891 Personal history of nicotine dependence: Secondary | ICD-10-CM | POA: Diagnosis not present

## 2021-05-25 DIAGNOSIS — I639 Cerebral infarction, unspecified: Secondary | ICD-10-CM | POA: Diagnosis not present

## 2021-05-25 DIAGNOSIS — N189 Chronic kidney disease, unspecified: Secondary | ICD-10-CM | POA: Diagnosis not present

## 2021-05-25 DIAGNOSIS — Y900 Blood alcohol level of less than 20 mg/100 ml: Secondary | ICD-10-CM | POA: Diagnosis not present

## 2021-05-25 DIAGNOSIS — Z8673 Personal history of transient ischemic attack (TIA), and cerebral infarction without residual deficits: Secondary | ICD-10-CM | POA: Diagnosis not present

## 2021-05-25 DIAGNOSIS — R531 Weakness: Secondary | ICD-10-CM | POA: Diagnosis present

## 2021-05-25 DIAGNOSIS — I6389 Other cerebral infarction: Secondary | ICD-10-CM

## 2021-05-25 DIAGNOSIS — I129 Hypertensive chronic kidney disease with stage 1 through stage 4 chronic kidney disease, or unspecified chronic kidney disease: Secondary | ICD-10-CM | POA: Diagnosis not present

## 2021-05-25 DIAGNOSIS — Z20822 Contact with and (suspected) exposure to covid-19: Secondary | ICD-10-CM | POA: Diagnosis not present

## 2021-05-25 DIAGNOSIS — Z79899 Other long term (current) drug therapy: Secondary | ICD-10-CM | POA: Diagnosis not present

## 2021-05-25 LAB — ANTITHROMBIN III: AntiThromb III Func: 114 % (ref 75–120)

## 2021-05-25 MED ORDER — ATORVASTATIN CALCIUM 40 MG PO TABS: 40.0000 mg | ORAL_TABLET | Freq: Every evening | ORAL | 2 refills | Status: DC

## 2021-05-25 MED ORDER — CLOPIDOGREL BISULFATE 75 MG PO TABS: 75.0000 mg | ORAL_TABLET | Freq: Every day | ORAL | 0 refills | Status: AC

## 2021-05-25 MED ORDER — ASPIRIN 81 MG PO TBEC
81.0000 mg | DELAYED_RELEASE_TABLET | Freq: Every day | ORAL | 11 refills | Status: DC
Start: 2021-05-26 — End: 2022-01-06

## 2021-05-25 MED ORDER — IPRATROPIUM-ALBUTEROL 0.5-2.5 (3) MG/3ML IN SOLN
3.0000 mL | Freq: Four times a day (QID) | RESPIRATORY_TRACT | Status: DC | PRN
  Administered 2021-05-25: 3 mL via RESPIRATORY_TRACT
  Filled 2021-05-25: qty 3

## 2021-05-25 MED ORDER — CLOPIDOGREL BISULFATE 75 MG PO TABS
75.0000 mg | ORAL_TABLET | Freq: Every day | ORAL | Status: DC
  Administered 2021-05-25: 75 mg via ORAL
  Filled 2021-05-25: qty 1

## 2021-05-25 NOTE — Progress Notes (Signed)
Received MD order to discharge patient to home. Reviewed discharge instructions home meds, prescriptions and follow up appointments with patient and she verbalized understanding.  Elana in Care management to follow up with patient in regards to outpatient PT.

## 2021-05-25 NOTE — Discharge Summary (Signed)
[facility] Discharge Summary   Patient: Tanya Henderson XEN:[id number]  PCP: Barbette Reichmann, MD  Date of admission: 05/23/2021   Date of discharge:  05/25/2021     Discharge Diagnoses:  Principal Problem:   Stroke [facility]) Active Problems:   Hidradenitis   Clinical depression   Gastro-esophageal reflux disease without esophagitis   Headache, migraine   Compulsive tobacco user syndrome   CVA (cerebral vascular accident) (HCC)   Obesity, Class III, BMI 40-49.9 (morbid obesity) (HCC)   Admitted From: Home Disposition:  Home with outpatient PT/OT  Recommendations for Outpatient Follow-up:  PCP: in 1 wk Neurology in 1 week Follow up LABS/TEST: Hypercoagulable work-up   Diet recommendation: Cardiac diet  Activity: The patient is advised to gradually reintroduce usual activities, as tolerated  Discharge Condition: stable  Code Status: Full code   History of present illness: As per the H and P dictated on admission Hospital Course:  Tanya Henderson is a left handed 45 y.o. female with medical history significant for hypertension, hyperlipidemia, depression, anxiety, GERD, morbid obesity, snores, empty sella, former tobacco user, who presents emergency department from work for chief concerns of right-sided weakness and weird feeling.  CT head positive for early subacute stroke, neurology was consulted and patient was admitted for further work-up as below. ssessment/Plan   Principal Problem:   Stroke [facility]) Active Problems:   Hidradenitis   Clinical depression   Gastro-esophageal reflux disease without esophagitis   Headache, migraine   Compulsive tobacco user syndrome   CVA (cerebral vascular accident) (HCC)   Obesity, Class III, BMI 40-49.9 (morbid obesity) (HCC)   Early subacute left caudate tail ischemic stroke-etiology known, Risk factors include morbid obesity, likely undiagnosed sleep apnea. EDP consulted telemetry neurologist, not a tPA candidate because she does  not have significant deficit.  Patient was monitored on telemetry, no significant events. MRI of the head and neck with and without contrast showed area of early subacute ischemia in the left caudate tail, in keeping with reported right-sided weakness.  No hemorrhage or mass-effect.  Normal intracranial MRA, and normal MRA neck, no large vessel occlusion. Fasting lipid LDL 99, A1c 5.7, B12 level 430, TSH 3.2, magnesium & phosphorus WNL, HIV negative Permissive hypertension during hospital stay.  Neuro check was done as per protocol, no new findings during hospital stay.   Started aspirin and Plavix for 21 days followed by aspirin 21 mg daily, started Lipitor 40 mg daily Patient was seen by PT and OT recommended outpatient PT/OT Neurology consulted, recommended hypercoagulable work-up.  TTE ruled out intracardiac source, negative bubble study.  Follow with neurology in 1 to 2 weeks, to follow hypercoagulable work-up COVID/influenza A/influenza B PCR negative   Snoring presumed secondary to undiagnosed OSA-CPAP nightly ordered during hospital stay. She has an upcoming sleep study in December, Extensive counseling with patient regarding keeping her sleep study exam and following CPAP compliance. I explained to patient that should she lose weight, she would not likely need another sleep study to assess her sleep apneic status   Hypertension, blood pressure soft, permissive hypertension was allowed, resumed losartan, advised to take it if blood pressure remains above 130 mmHg.  Follow with PCP for further management.  Depression/anxiety-patient takes sertraline 50 mg daily GERD-PPI Empty sella, follow with PCP Morbid obesity-recommend work with outpatient PCP for healthy weight loss  Body mass index is 45.49 kg/m.  Nutrition Interventions: Calorie restricted diet and daily exercise advised.      Patient was seen by physical therapy, who recommended  outpatient PT/OT, which was arranged. On the day  of the discharge the patient's vitals were stable, and no other acute medical condition were reported by patient. the patient was felt safe to be discharge at Home with outpatient PT/OT  Consultants: Neurology Procedures: None  Discharge Exam: General: Appear in mild distress, no Rash; Oral Mucosa Clear, moist. Cardiovascular: S1 and S2 Present, no Murmur, Respiratory: normal respiratory effort, Bilateral Air entry present and no Crackles, occasional very mild wheezes Abdomen: Bowel Sound present, Soft and no tenderness, no hernia Extremities: no Pedal edema, no calf tenderness Neurology: alert and oriented to time, place, and person.  Right-sided weakness power 4/5, no numbness, CN grossly intact affect appropriate.  Filed Weights   05/23/21 2119  Weight: 120.2 kg   Vitals:   05/25/21 1140 05/25/21 1440  BP: (!) 104/59   Pulse: 72   Resp: 15   Temp: 98 F (36.7 C)   SpO2: 93% 93%    DISCHARGE MEDICATION: Allergies as of 05/25/2021       Reactions   Clindamycin/lincomycin    Penicillins Other (See Comments)   Reaction:  Unknown         Medication List     TAKE these medications    aspirin 81 MG EC tablet Take 1 tablet (81 mg total) by mouth daily. Swallow whole. Start taking on: May 26, 2021   atorvastatin 40 MG tablet Commonly known as: LIPITOR Take 1 tablet (40 mg total) by mouth every evening.   BIOTIN PO Take by mouth daily.   clopidogrel 75 MG tablet Commonly known as: PLAVIX Take 1 tablet (75 mg total) by mouth daily for 21 days.   Cozaar 25 MG tablet Generic drug: losartan Take 1 tablet (25 mg total) by mouth daily. Skip dose if systolic BP less than 130 mmHg What changed: additional instructions   omeprazole 20 MG capsule Commonly known as: PRILOSEC Take 1 capsule (20 mg total) by mouth daily.   VITAMIN D (CHOLECALCIFEROL) PO Take by mouth daily.       Allergies  Allergen Reactions   Clindamycin/Lincomycin    Penicillins Other  (See Comments)    Reaction:  Unknown    Discharge Instructions     Ambulatory referral to Neurology   Complete by: As directed    6 wks   Call MD for:   Complete by: As directed    Any neurological symptoms, weakness and numbness   Call MD for:  difficulty breathing, headache or visual disturbances   Complete by: As directed    Call MD for:  extreme fatigue   Complete by: As directed    Call MD for:  persistant dizziness or light-headedness   Complete by: As directed    Call MD for:  severe uncontrolled pain   Complete by: As directed    Diet - low sodium heart healthy   Complete by: As directed    Discharge instructions   Complete by: As directed    Follow-up with PCP in 1 week, continue to monitor BP at home and skip dose of losartan if SBP less than 130 mmHg, follow with PCP to titrate medication accordingly.  Continue aspirin and Lipitor.  Repeat lipid profile after 3 months. Follow with neurology in 1 to 2 weeks as an outpatient to follow-up on hypercoagulable work-up   Increase activity slowly   Complete by: As directed        The results of significant diagnostics from this hospitalization (including imaging, microbiology, ancillary  and laboratory) are listed below for reference.    Significant Diagnostic Studies: DG Chest 2 View  Result Date: 05/24/2021 CLINICAL DATA:  Stroke EXAM: CHEST - 2 VIEW COMPARISON:  None. FINDINGS: The heart size and mediastinal contours are within normal limits. Both lungs are clear. The visualized skeletal structures are unremarkable. IMPRESSION: No active cardiopulmonary disease. Electronically Signed   By: Helyn Numbers M.D.   On: 05/24/2021 01:05   CT HEAD WO CONTRAST ( )  Result Date: 05/23/2021 CLINICAL DATA:  Neuro deficit, acute, stroke suspected. Right arm and leg numbness EXAM: CT HEAD WITHOUT CONTRAST TECHNIQUE: Contiguous axial images were obtained from the base of the skull through the vertex without intravenous contrast.  COMPARISON:  None. FINDINGS: Brain: Normal anatomic configuration. No abnormal intra or extra-axial mass lesion or fluid collection. No abnormal mass effect or midline shift. No evidence of acute intracranial hemorrhage or infarct. Ventricular size is normal. Cerebellum unremarkable. Vascular: Unremarkable Skull: Intact Sinuses/Orbits: Paranasal sinuses are clear. Orbits are unremarkable. Other: Mastoid air cells and middle ear cavities are clear. IMPRESSION: No acute abnormality.  Normal examination. Electronically Signed   By: Helyn Numbers M.D.   On: 05/23/2021 22:01   MR ANGIO HEAD WO CONTRAST  Result Date: 05/23/2021 CLINICAL DATA:  Seizure with right arm and leg numbness EXAM: MRI HEAD WITHOUT CONTRAST MRA HEAD WITHOUT CONTRAST TECHNIQUE: Multiplanar, multi-echo pulse sequences of the brain and surrounding structures were acquired without intravenous contrast. Angiographic images of the Circle of Willis were acquired using MRA technique without intravenous contrast. COMPARISON:  None FINDINGS: MRI HEAD FINDINGS Brain: Area of early subacute ischemia in the left caudate tail. No acute or chronic hemorrhage. Normal white matter signal, parenchymal volume and CSF spaces. The midline structures are normal. Vascular: Major flow voids are preserved. Skull and upper cervical spine: Normal calvarium and skull base. Visualized upper cervical spine and soft tissues are normal. Sinuses/Orbits:No paranasal sinus fluid levels or advanced mucosal thickening. No mastoid or middle ear effusion. Normal orbits. MRA HEAD FINDINGS POSTERIOR CIRCULATION: --Vertebral arteries: Diminutive right V4 segment.  Normal left. --Inferior cerebellar arteries: Normal. --Basilar artery: Normal. --Superior cerebellar arteries: Normal. --Posterior cerebral arteries: Normal. ANTERIOR CIRCULATION: --Intracranial internal carotid arteries: Normal. --Anterior cerebral arteries (ACA): Normal. --Middle cerebral arteries (MCA): Normal. ANATOMIC  VARIANTS: None IMPRESSION: 1. Area of early subacute ischemia in the left caudate tail, in keeping with reported right-sided weakness. No hemorrhage or mass effect. 2. Normal intracranial MRA. Electronically Signed   By: Deatra Robinson M.D.   On: 05/23/2021 23:58   MR Angiogram Neck W or Wo Contrast  Result Date: 05/25/2021 CLINICAL DATA:  Carotid artery stenosis EXAM: MRA NECK WITHOUT AND WITH CONTRAST TECHNIQUE: Multiplanar and multiecho pulse sequences of the neck were obtained without and with intravenous contrast. Angiographic images of the neck were obtained using MRA technique without and with intravenous contrast. CONTRAST:  10mL GADAVIST GADOBUTROL 1 MMOL/ML IV SOLN COMPARISON:  None. FINDINGS: Motion artifact is present. Common, internal, and external carotid arteries are patent. Extracranial vertebral arteries are patent. Great vessel and vertebral artery origins are not well evaluated due to artifact. No hemodynamically significant stenosis visualized portions IMPRESSION: Degraded by motion. No occlusion or hemodynamically significant stenosis. Electronically Signed   By: Guadlupe Spanish M.D.   On: 05/25/2021 08:29   MR BRAIN WO CONTRAST  Result Date: 05/23/2021 CLINICAL DATA:  Seizure with right arm and leg numbness EXAM: MRI HEAD WITHOUT CONTRAST MRA HEAD WITHOUT CONTRAST TECHNIQUE: Multiplanar, multi-echo pulse sequences of the  brain and surrounding structures were acquired without intravenous contrast. Angiographic images of the Circle of Willis were acquired using MRA technique without intravenous contrast. COMPARISON:  None FINDINGS: MRI HEAD FINDINGS Brain: Area of early subacute ischemia in the left caudate tail. No acute or chronic hemorrhage. Normal white matter signal, parenchymal volume and CSF spaces. The midline structures are normal. Vascular: Major flow voids are preserved. Skull and upper cervical spine: Normal calvarium and skull base. Visualized upper cervical spine and soft  tissues are normal. Sinuses/Orbits:No paranasal sinus fluid levels or advanced mucosal thickening. No mastoid or middle ear effusion. Normal orbits. MRA HEAD FINDINGS POSTERIOR CIRCULATION: --Vertebral arteries: Diminutive right V4 segment.  Normal left. --Inferior cerebellar arteries: Normal. --Basilar artery: Normal. --Superior cerebellar arteries: Normal. --Posterior cerebral arteries: Normal. ANTERIOR CIRCULATION: --Intracranial internal carotid arteries: Normal. --Anterior cerebral arteries (ACA): Normal. --Middle cerebral arteries (MCA): Normal. ANATOMIC VARIANTS: None IMPRESSION: 1. Area of early subacute ischemia in the left caudate tail, in keeping with reported right-sided weakness. No hemorrhage or mass effect. 2. Normal intracranial MRA. Electronically Signed   By: Deatra Robinson M.D.   On: 05/23/2021 23:58   ECHOCARDIOGRAM COMPLETE  Result Date: 05/24/2021    ECHOCARDIOGRAM REPORT   Patient Name:   Tanya Henderson Date of Exam: 05/24/2021 Medical Rec #:  [id number]       Height:       64.0 in Accession #:    [id number]      Weight:       265.0 lb Date of Birth:  [birth year]        BSA:          2.205 m Patient Age:    45 years        BP:           130/88 mmHg Patient Gender: F               HR:           70 bpm. Exam Location:  [facility] Procedure: 2D Echo, Color Doppler, Cardiac Doppler and Intracardiac            Opacification Agent Indications:     I63.9 Stroke  History:         Patient has no prior history of Echocardiogram examinations.                  CKD.  Sonographer:     Humphrey Rolls Referring Phys:  [id number] AMY N COX Diagnosing Phys: Cristal Deer End MD  Sonographer Comments: Technically difficult study due to poor echo windows. Image acquisition challenging due to patient body habitus. IMPRESSIONS  1. Left ventricular ejection fraction, by estimation, is 55 to 60%. The left ventricle has normal function. The left ventricle has no regional wall motion abnormalities. The left ventricular internal  cavity size was borderline dilated. Left ventricular diastolic parameters were normal.  2. Right ventricular systolic function is normal. The right ventricular size is normal. Tricuspid regurgitation signal is inadequate for assessing PA pressure.  3. The mitral valve was not well visualized. No evidence of mitral valve regurgitation. No evidence of mitral stenosis.  4. The aortic valve was not well visualized. Aortic valve regurgitation is not visualized. No aortic stenosis is present.  5. The inferior vena cava is normal in size with <50% respiratory variability, suggesting right atrial pressure of 8 mmHg. FINDINGS  Left Ventricle: Left ventricular ejection fraction, by estimation, is 55 to 60%. The left ventricle has normal function. The left ventricle has  no regional wall motion abnormalities. Definity contrast agent was given IV to delineate the left ventricular  endocardial borders. The left ventricular internal cavity size was borderline dilated. There is no left ventricular hypertrophy. Left ventricular diastolic parameters were normal. Right Ventricle: The right ventricular size is normal. No increase in right ventricular wall thickness. Right ventricular systolic function is normal. Tricuspid regurgitation signal is inadequate for assessing PA pressure. Left Atrium: Left atrial size was normal in size. Right Atrium: Right atrial size was normal in size. Pericardium: There is no evidence of pericardial effusion. Presence of epicardial fat layer. Mitral Valve: The mitral valve was not well visualized. No evidence of mitral valve regurgitation. No evidence of mitral valve stenosis. MV peak gradient, 2.8 mmHg. The mean mitral valve gradient is 1.0 mmHg. Tricuspid Valve: The tricuspid valve is not well visualized. Tricuspid valve regurgitation is not demonstrated. Aortic Valve: The aortic valve was not well visualized. Aortic valve regurgitation is not visualized. No aortic stenosis is present. Aortic valve  mean gradient measures 5.0 mmHg. Aortic valve peak gradient measures 9.5 mmHg. Aortic valve area, by VTI measures 2.23 cm. Pulmonic Valve: The pulmonic valve was not well visualized. Pulmonic valve regurgitation is not visualized. No evidence of pulmonic stenosis. Aorta: The aortic root is normal in size and structure. Pulmonary Artery: The pulmonary artery is of normal size. Venous: The inferior vena cava is normal in size with less than 50% respiratory variability, suggesting right atrial pressure of 8 mmHg. IAS/Shunts: The interatrial septum was not well visualized.  LEFT VENTRICLE PLAX 2D LVIDd:         5.25 cm   Diastology LVIDs:         3.75 cm   LV e' medial:    8.38 cm/s LV PW:         0.95 cm   LV E/e' medial:  10.2 LV IVS:        0.78 cm   LV e' lateral:   9.25 cm/s LVOT diam:     2.10 cm   LV E/e' lateral: 9.2 LV SV:         67 LV SV Index:   30 LVOT Area:     3.46 cm  LEFT ATRIUM             Index LA diam:        4.00 cm 1.81 cm/m LA Vol (A2C):   29.3 ml 13.29 ml/m LA Vol (A4C):   41.6 ml 18.87 ml/m LA Biplane Vol: 34.9 ml 15.83 ml/m  AORTIC VALVE                     PULMONIC VALVE AV Area (Vmax):    2.13 cm      PV Vmax:       0.93 m/s AV Area (Vmean):   2.02 cm      PV Vmean:      63.000 cm/s AV Area (VTI):     2.23 cm      PV VTI:        0.208 m AV Vmax:           154.00 cm/s   PV Peak grad:  3.5 mmHg AV Vmean:          104.000 cm/s  PV Mean grad:  2.0 mmHg AV VTI:            0.300 m AV Peak Grad:      9.5 mmHg AV Mean Grad:  5.0 mmHg LVOT Vmax:         94.60 cm/s LVOT Vmean:        60.800 cm/s LVOT VTI:          0.193 m LVOT/AV VTI ratio: 0.64  AORTA Ao Root diam: 2.60 cm MITRAL VALVE MV Area (PHT): 3.15 cm    SHUNTS MV Area VTI:   2.61 cm    Systemic VTI:  0.19 m MV Peak grad:  2.8 mmHg    Systemic Diam: 2.10 cm MV Mean grad:  1.0 mmHg MV Vmax:       0.84 m/s MV Vmean:      54.1 cm/s MV Decel Time: 241 msec MV E velocity: 85.30 cm/s MV A velocity: 76.70 cm/s MV E/A ratio:  1.11  Yvonne Kendall MD Electronically signed by Yvonne Kendall MD Signature Date/Time: 05/24/2021/2:24:39 PM    Final    ECHOCARDIOGRAM LIMITED BUBBLE STUDY  Result Date: 05/25/2021    ECHOCARDIOGRAM LIMITED REPORT   Patient Name:   Tanya Henderson Date of Exam: 05/25/2021 Medical Rec #:  [id number]       Height:       64.0 in Accession #:    [id number]      Weight:       265.0 lb Date of Birth:  1975-09-21        BSA:          2.205 m Patient Age:    45 years        BP:           122/76 mmHg Patient Gender: F               HR:           73 bpm. Exam Location:  [facility] Procedure: Limited Echo and Saline Contrast Bubble Study Indications:     I63.9 Stroke  History:         Patient has prior history of Echocardiogram examinations, most                  recent 05/24/2021. CKD.  Sonographer:     Humphrey Rolls Referring Phys:  [id number] Gillis Santa Diagnosing Phys: Debbe Odea MD  Sonographer Comments: Suboptimal apical window. IMPRESSIONS  1. Left ventricular ejection fraction, by estimation, is 60 to 65%. The left ventricle has normal function.  2. Images were suboptimal. Agitated saline contrast bubble study was negative, with no evidence of any interatrial shunt. FINDINGS  Left Ventricle: Left ventricular ejection fraction, by estimation, is 60 to 65%. The left ventricle has normal function. IAS/Shunts: Agitated saline contrast was given intravenously to evaluate for intracardiac shunting. Agitated saline contrast bubble study was negative, with no evidence of any interatrial shunt. Debbe Odea MD Electronically signed by Debbe Odea MD Signature Date/Time: 05/25/2021/2:19:29 PM    Final     Microbiology: Recent Results (from the past 240 hour(s))  Resp Panel by RT-PCR (Flu A&B, Covid) Nasopharyngeal Swab     Status: None   Collection Time: 05/24/21 12:25 AM   Specimen: Nasopharyngeal Swab; Nasopharyngeal(NP) swabs in vial transport medium  Result Value Ref Range Status   SARS Coronavirus 2 by RT PCR  NEGATIVE NEGATIVE Final    Comment: (NOTE) SARS-CoV-2 target nucleic acids are NOT DETECTED.  The SARS-CoV-2 RNA is generally detectable in upper respiratory specimens during the acute phase of infection. The lowest concentration of SARS-CoV-2 viral copies this assay can detect is 138 copies/mL. A negative result does not preclude SARS-Cov-2 infection and  should not be used as the sole basis for treatment or other patient management decisions. A negative result may occur with  improper specimen collection/handling, submission of specimen other than nasopharyngeal swab, presence of viral mutation(s) within the areas targeted by this assay, and inadequate number of viral copies(<138 copies/mL). A negative result must be combined with clinical observations, patient history, and epidemiological information. The expected result is Negative.  Fact Sheet for Patients:  BloggerCourse.com  Fact Sheet for Healthcare Providers:  SeriousBroker.it  This test is no t yet approved or cleared by the [place] FDA and  has been authorized for detection and/or diagnosis of SARS-CoV-2 by FDA under an Emergency Use Authorization (EUA). This EUA will remain  in effect (meaning this test can be used) for the duration of the COVID-19 declaration under Section 564(b)(1) of the Act, 21 U.S.C.section 360bbb-3(b)(1), unless the authorization is terminated  or revoked sooner.       Influenza A by PCR NEGATIVE NEGATIVE Final   Influenza B by PCR NEGATIVE NEGATIVE Final    Comment: (NOTE) The Xpert Xpress SARS-CoV-2/FLU/RSV plus assay is intended as an aid in the diagnosis of influenza from Nasopharyngeal swab specimens and should not be used as a sole basis for treatment. Nasal washings and aspirates are unacceptable for Xpert Xpress SARS-CoV-2/FLU/RSV testing.  Fact Sheet for Patients: BloggerCourse.com  Fact Sheet for  Healthcare Providers: SeriousBroker.it  This test is not yet approved or cleared by the [place] FDA and has been authorized for detection and/or diagnosis of SARS-CoV-2 by FDA under an Emergency Use Authorization (EUA). This EUA will remain in effect (meaning this test can be used) for the duration of the COVID-19 declaration under Section 564(b)(1) of the Act, 21 U.S.C. section 360bbb-3(b)(1), unless the authorization is terminated or revoked.  Performed at [facility], [address] [place], [place] [address]      Labs: CBC: Recent Labs  Lab 05/23/21 2213  WBC 8.8  HGB 13.9  HCT 42.1  MCV 90.1  PLT 316   Basic Metabolic Panel: Recent Labs  Lab 05/23/21 2213 05/24/21 0642  NA 135  --   K 4.0  --   CL 105  --   CO2 25  --   GLUCOSE 100*  --   BUN 11  --   CREATININE 0.73  --   CALCIUM 9.1  --   MG  --  2.0  PHOS  --  3.4   Liver Function Tests: Recent Labs  Lab 05/23/21 2213  AST 48*  ALT 74*  ALKPHOS 161*  BILITOT 0.6  PROT 7.9  ALBUMIN 4.1   No results for input(s): LIPASE, AMYLASE in the last 168 hours. No results for input(s): AMMONIA in the last 168 hours. Cardiac Enzymes: No results for input(s): CKTOTAL, CKMB, CKMBINDEX, TROPONINI in the last 168 hours. BNP (last 3 results) No results for input(s): BNP in the last 8760 hours. CBG: No results for input(s): GLUCAP in the last 168 hours.  Time spent: 35 minutes  Signed:  Gillis Santa  [facility]  05/25/2021 4:10 PM

## 2021-05-25 NOTE — Plan of Care (Signed)
Bubble study negative. Hypercoag w/u ordered. Ambulatory referral to neurology placed to Dr. Sherryll Burger for hospital f/u for CVA. Hypercoag results may be followed up by Dr. Sherryll Burger in clinic.  Bing Neighbors, MD [facility] [phone]  If 7pm- 7am, please page neurology on call as listed in AMION.

## 2021-05-25 NOTE — Progress Notes (Signed)
Occupational Therapy Treatment Patient Details Name: Tanya Henderson MRN: [id number] DOB: 05-08-1976 Today's Date: 05/25/2021   History of present illness Patient is a 45 year old female with with medical history significant for hypertension, hyperlipidemia, depression, anxiety, GERD, morbid obesity, snores, empty sella, former tobacco user, who presents emergency department from work for chief concerns of right-sided weakness. MRI of the head and neck with and without contrast showed area of early subacute ischemia in the left caudate tail. Etiology work-up ongoing.   OT comments  Tanya Henderson was seen for OT treatment on this date. Upon arrival to room pt semi-supine in bed. She presents with impaired abiilty to perform ADL/self-care tasks at her baseline level of independence 2/2 ongoing R sided weakness (RUE>RLE). OT facilitates functional ADL tasks as described below (See ADL section for additional details). Pt initially required CGA for stability upon initial STS attempt, but is able to progress to modified independence for functional transfers and standing standing grooming tasks at sink. Pt provided with reinforcement of past education on [facility] exercises and strategies incorporate RUE into daily routines to maximize functional return. Pt/provider discussed: health care management strategies, strateigies to support safety during functional activities including safe positioning techniques and falls prevention strategies. Pt return verbalized understanding of instruction provided. Pt making good progress toward goals and continues to benefit from skilled OT services to maximize return to PLOF and minimize risk of future falls, injury, caregiver burden, and readmission. Will continue to follow POC as written. Discharge recommendation remains appropriate.     Recommendations for follow up therapy are one component of a multi-disciplinary discharge planning process, led by the attending physician.   Recommendations may be updated based on patient status, additional functional criteria and insurance authorization.    Follow Up Recommendations  Outpatient OT    Assistance Recommended at Discharge PRN  Equipment Recommendations  None recommended by OT    Recommendations for Other Services      Precautions / Restrictions Precautions Precautions: Fall Precaution Comments: Low Fall Restrictions Weight Bearing Restrictions: No       Mobility Bed Mobility Overal bed mobility: Modified Independent             General bed mobility comments: Increased time/effort to perform. No physical assist with use of bed rails when exiting bed to L side.    Transfers Overall transfer level: Modified independent Equipment used: 1 person hand held assist               General transfer comment: Initial CGA for STS from EOB at start of session.     Balance Overall balance assessment: Needs assistance Sitting-balance support: No upper extremity supported;Feet supported Sitting balance-Leahy Scale: Good     Standing balance support: During functional activity;No upper extremity supported Standing balance-Leahy Scale: Good                             ADL either performed or assessed with clinical judgement   ADL Overall ADL's : Needs assistance/impaired                                     Functional mobility during ADLs: Min guard;Supervision/safety General ADL Comments: Pt performs standing grooming tasks at sink with modified independence and min cueing for starategies to incorporate RUE into functional tasks. Supervision with initial CGA for standing balance during transfers. Pt endorses  RLE/RUE "feel like jelly". Noted decreased dorsiflexion with amb in RLE which impacts pt safety with functional mobility. Educated on strategies to maximize functional use of affected side and falls prevention techniques during ADL management.    Extremity/Trunk  Assessment Upper Extremity Assessment Upper Extremity Assessment: RUE deficits/detail RUE Deficits / Details: Pt continues to demonstrate functional deficits with her RUE. Decreased [facility] appreciated with decreased finger isolation and palm to finger translation during functional tasks. RUE Sensation: WNL RUE Coordination: decreased fine motor;decreased gross motor LUE Deficits / Details: WNL LUE Sensation: WNL LUE Coordination: WNL   Lower Extremity Assessment Lower Extremity Assessment: Defer to PT evaluation;RLE deficits/detail RLE Deficits / Details: Pt noted with minimally circumducted gait during functional mobility with decreased active dorsiflexion appreciated.   Cervical / Trunk Assessment Cervical / Trunk Assessment: Normal    Vision Baseline Vision/History: 0 No visual deficits Patient Visual Report: No change from baseline     Perception     Praxis      Cognition Arousal/Alertness: Awake/alert Behavior During Therapy: WFL for tasks assessed/performed Overall Cognitive Status: Within Functional Limits for tasks assessed                                 General Comments: patient is alert and oriented x 4. patient is able to follow all commands without difficulty          Exercises Other Exercises Other Exercises: Pt provided with reinforcement of past education on [facility] exercises and strategies incorporate RUE into daily routines to maximize functional return. Pt/provider discussed: health care management strategies, strateigies to support safety during functional activities including safe positioning techniques and falls prevention strategies.   Shoulder Instructions       General Comments      Pertinent Vitals/ Pain       Pain Assessment: 0-10 Pain Score: 4  Pain Location: BLE soreness. Pain Descriptors / Indicators: Sore Pain Intervention(s): Monitored during session;Repositioned (RN aware, pt declines pain medication.)  Home Living                                           Prior Functioning/Environment              Frequency  Min 3X/week        Progress Toward Goals  OT Goals(current goals can now be found in the care plan section)  Progress towards OT goals: Progressing toward goals  Acute Rehab OT Goals Patient Stated Goal: To return to work and my regular routines. OT Goal Formulation: With patient/family Time For Goal Achievement: 06/07/21 Potential to Achieve Goals: Good  Plan Discharge plan remains appropriate;Frequency remains appropriate    Co-evaluation                 AM-PAC OT "6 Clicks" Daily Activity     Outcome Measure   Help from another person eating meals?: None Help from another person taking care of personal grooming?: A Little Help from another person toileting, which includes using toliet, bedpan, or urinal?: A Little Help from another person bathing (including washing, rinsing, drying)?: A Little Help from another person to put on and taking off regular upper body clothing?: None Help from another person to put on and taking off regular lower body clothing?: None 6 Click Score: 21    End of  Session    OT Visit Diagnosis: Other abnormalities of gait and mobility (R26.89);Hemiplegia and hemiparesis Hemiplegia - Right/Left: Right Hemiplegia - dominant/non-dominant: Non-Dominant Hemiplegia - caused by: Cerebral infarction   Activity Tolerance Patient tolerated treatment well   Patient Left with call bell/phone within reach;in chair (No chair alarm set, pt low fall risk and requesting to have alarms turned off.)   Nurse Communication Mobility status        Time: 7425-9563 OT Time Calculation (min): 33 min  Charges: OT General Charges $OT Visit: 1 Visit OT Treatments $Self Care/Home Management : 23-37 mins  Rockney Ghee, M.S., OTR/L Feeding Team - [facility] Ascom: [phone] 05/25/21, 12:15 PM

## 2021-05-26 LAB — HOMOCYSTEINE: Homocysteine: 7.8 umol/L (ref 0.0–14.5)

## 2021-05-26 LAB — PROTEIN C, TOTAL: Protein C, Total: 86 % (ref 60–150)

## 2021-05-26 LAB — PROTEIN S ACTIVITY: Protein S Activity: 79 % (ref 63–140)

## 2021-05-26 LAB — LUPUS ANTICOAGULANT PANEL
DRVVT: 30.4 s (ref 0.0–47.0)
PTT Lupus Anticoagulant: 39.9 s (ref 0.0–51.9)

## 2021-05-26 LAB — PROTEIN S, TOTAL: Protein S Ag, Total: 72 % (ref 60–150)

## 2021-05-26 LAB — PROTEIN C ACTIVITY: Protein C Activity: 121 % (ref 73–180)

## 2021-05-27 ENCOUNTER — Telehealth: Payer: Self-pay

## 2021-05-27 LAB — CARDIOLIPIN ANTIBODIES, IGG, IGM, IGA
Anticardiolipin IgA: <9 APL U/mL (ref 0–11)
Anticardiolipin IgG: <9 GPL U/mL (ref 0–14)
Anticardiolipin IgM: 45 MPL U/mL — ABNORMAL HIGH (ref 0–12)

## 2021-05-27 LAB — BETA-2-GLYCOPROTEIN I ABS, IGG/M/A
Beta-2 Glyco I IgG: <9 GPI IgG units (ref 0–20)
Beta-2-Glycoprotein I IgA: <9 GPI IgA units (ref 0–25)
Beta-2-Glycoprotein I IgM: 13 GPI IgM units (ref 0–32)

## 2021-05-27 NOTE — Telephone Encounter (Signed)
Received message patient was needing call back from [facility] to clarify appointments with Neuro, PCP and OP PT. Writer outreached to patient and Gloria-patients aunt. Patient was upset that she did not have appointments already made but verbalized understanding RN had told her office was closed and she would need to call and make after discharge. Pt reports she had not heard back from OP PT-this writer confirmed would follow up with OP PT agency and get confirmation. Patient concerned about ED MD only giving her note for 2 weeks out of work. Writer explained she would request more time after meeting with Neurologist and getting treatment plan. Aunt verbalized frustration with wait in ED-confirmed patient had number for patient experience if she wanted to file a grievance. Patient/Aunt requested resources for rent/groceries/car payment due to patient being out of work. Psychologist, educational for DSS for potential food stamps and assistance. No other needs or concerns at this time however patient and aunt have Clinical research associate number if further needs arise.

## 2021-05-27 NOTE — Progress Notes (Addendum)
05/27/21 3:15 pm.  Patient was DC from 1C earlier this week according to Nurse Secretary. CSW is covering 1C today. Nurse Secretary requested CSW call patient due to questions about her therapy plan.  Called patient, spoke to patient and Aunt via speaker phone. Per chart review patient was recommended for OPPT. Explained this and OPPT options. Offered to send list of OPPT locations to choose from. Patient and Aunt had a list of concerns and requested a call from [facility] Supervisor. Informed TOC Supervisor of request.   They also requested to make a formal complaint about various concerns with their experience. Provided contact info for Office of Patient Experience.  Alfonso Ramus, [place] [phone]

## 2021-05-30 LAB — PROTHROMBIN GENE MUTATION

## 2021-05-31 LAB — FACTOR 5 LEIDEN

## 2021-06-02 ENCOUNTER — Ambulatory Visit: Payer: Commercial Managed Care - PPO | Attending: Internal Medicine | Admitting: Occupational Therapy

## 2021-06-02 ENCOUNTER — Other Ambulatory Visit: Payer: Self-pay

## 2021-06-02 ENCOUNTER — Encounter: Payer: Self-pay | Admitting: Occupational Therapy

## 2021-06-02 DIAGNOSIS — M6281 Muscle weakness (generalized): Secondary | ICD-10-CM | POA: Insufficient documentation

## 2021-06-02 DIAGNOSIS — R278 Other lack of coordination: Secondary | ICD-10-CM | POA: Insufficient documentation

## 2021-06-02 NOTE — Therapy (Signed)
[place] MAIN [facility] SERVICES [address] [place], [place], [address] Phone: [phone]   Fax:  [phone]  Occupational Therapy Evaluation  Patient Details  Name: Tanya Henderson MRN: [id number] Date of Birth: [birth year] Referring Provider (OT): Weston Brass Date: 06/02/2021   OT End of Session - 06/02/21 1305     Visit Number 1    Number of Visits 12    Date for OT Re-Evaluation 08/25/21    Authorization Type Progress reporting period starting 06/02/2021    OT Start Time 1215    OT Stop Time 1315    OT Time Calculation (min) 60 min    Activity Tolerance Patient tolerated treatment well    Behavior During Therapy [facility] for tasks assessed/performed             Past Medical History:  Diagnosis Date   Anxiety    Bronchitis    Chronic kidney disease    Kidney stones   Depression    GERD (gastroesophageal reflux disease)    Headache    Renal calculi    Vitamin D deficiency     Past Surgical History:  Procedure Laterality Date   CHOLECYSTECTOMY     CYSTOSCOPY W/ URETERAL STENT PLACEMENT Right 09/21/2015   Procedure: CYSTOSCOPY WITH STENT REPLACEMENT;  Surgeon: Hollice Espy, MD;  Location: [facility] ORS;  Service: Urology;  Laterality: Right;   CYSTOSCOPY W/ URETERAL STENT REMOVAL Right 02/10/2015   Procedure: CYSTOSCOPY WITH STENT REMOVAL;  Surgeon: Collier Flowers, MD;  Location: [facility] ORS;  Service: Urology;  Laterality: Right;   CYSTOSCOPY WITH STENT PLACEMENT Right 01/29/2015   Procedure: CYSTOSCOPY WITH STENT PLACEMENT;  Surgeon: Ardis Hughs, MD;  Location: [facility] ORS;  Service: Urology;  Laterality: Right;   CYSTOSCOPY WITH STENT PLACEMENT Right 02/10/2015   Procedure: CYSTOSCOPY WITH STENT PLACEMENT;  Surgeon: Collier Flowers, MD;  Location: [facility] ORS;  Service: Urology;  Laterality: Right;   CYSTOSCOPY WITH STENT PLACEMENT Right 09/07/2015   Procedure: CYSTOSCOPY WITH STENT PLACEMENT;  Surgeon: Ardis Hughs, MD;   Location: [facility] ORS;  Service: Urology;  Laterality: Right;   CYSTOSCOPY/RETROGRADE/URETEROSCOPY/STONE EXTRACTION WITH BASKET  05/26/2015   Procedure: CYSTOSCOPY/RETROGRADE/URETEROSCOPY/STONE EXTRACTION WITH BASKET;  Surgeon: Hollice Espy, MD;  Location: [facility] ORS;  Service: Urology;;   CYSTOSCOPY/URETEROSCOPY/HOLMIUM LASER  05/26/2015   Procedure: CYSTOSCOPY/URETEROSCOPY/HOLMIUM LASER;  Surgeon: Hollice Espy, MD;  Location: [facility] ORS;  Service: Urology;;   EXTRACORPOREAL SHOCK WAVE LITHOTRIPSY Right 04/22/2015   Procedure: EXTRACORPOREAL SHOCK WAVE LITHOTRIPSY (ESWL);  Surgeon: Nickie Retort, MD;  Location: [facility] ORS;  Service: Urology;  Laterality: Right;   KIDNEY STONE SURGERY     OOPHORECTOMY Right 1996   Ovarian cyst   URETEROSCOPY WITH HOLMIUM LASER LITHOTRIPSY Right 02/10/2015   Procedure: URETEROSCOPY WITH HOLMIUM LASER LITHOTRIPSY;  Surgeon: Collier Flowers, MD;  Location: [facility] ORS;  Service: Urology;  Laterality: Right;   URETEROSCOPY WITH HOLMIUM LASER LITHOTRIPSY Right 09/21/2015   Procedure: URETEROSCOPY WITH HOLMIUM LASER LITHOTRIPSY/ fragmentation and removal;  Surgeon: Hollice Espy, MD;  Location: [facility] ORS;  Service: Urology;  Laterality: Right;    There were no vitals filed for this visit.   Subjective Assessment - 06/02/21 1258     Subjective  Pt. reports having questions about the process for applying for disability, as she doe not currently have short term disability through her work.    Pertinent History Pt. is a 45 y.o. 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 Pt. PMHx includes: Hidradentitis, Depression, GERD, Migraine headache, Compulsive Tobacco User Syndrome, Obesity. Pt. 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  pt. Enjoys reading, and outdoor activities.    Currently in Pain? No/denies               [facility] OT Assessment - 06/02/21 1131        Assessment   Medical Diagnosis CVA    Referring Provider (OT) Lucianne Muss    Onset Date/Surgical Date 05/23/21    Hand Dominance Left    Next MD Visit 06/08/2021    Prior Therapy OT/PT      Precautions   Precautions None      Restrictions   Weight Bearing Restrictions No      Balance Screen   Has the patient fallen in the past 6 months No    Has the patient had a decrease in activity level because of a fear of falling?  No    Is the patient reluctant to leave their home because of a fear of falling?  No      Home  Environment   Family/patient expects to be discharged to: Private residence    Living Arrangements Children    Available Help at Discharge Family    Type of Home House    Home Layout One level    Bathroom Shower/Tub Tub/Shower unit;Curtain    IT consultant held shower head    Lives With Son      Prior Function   Level of Independence Independent    Vocation Full time employment    Vocation Requirements CNA at [employer]   Also has a part time job. Worked 7 days a week.   Leisure Travel, reading, bookstore      ADL   Eating/Feeding Independent   Difficulty opening packets, cutting food   Grooming Independent   motor control with brushing hair with the right hand   Upper Body Bathing Independent   Requires increased time   Lower Body Bathing Independent    Upper Body Dressing Independent   increased time fastening bra, buttons   Lower Body Dressing Independent;Increased time    Lobbyist -  Database administrator Independent      IADL   Prior Level of Function Shopping Independent    Shopping Needs to be accompanied on any shopping trip    Prior Level of Function Light Housekeeping Independent    Light Housekeeping Performs light daily tasks such as dishwashing, bed making   fatigues more quickly   Prior Level of Function Meal Prep Independent    Meal Prep Able to  complete simple warm meal prep    Prior Level of Function Best boy Drives own vehicle    Prior Level of Function Medication Managment Independent    Medication Management Is responsible for taking medication in correct dosages at correct time    Prior Level of Function Chemical engineer financial matters independently (budgets, writes checks, pays rent, bills goes to bank), collects and keeps track of income      Mobility   Mobility Status Independent      Written Expression   Dominant Hand Left    Handwriting --   No  changes noted     Vision - History   Baseline Vision Wears glasses all the time      Activity Tolerance   Activity Tolerance Tolerates 10-20 min activity with multiple rests      Cognition   Overall Cognitive Status Within Functional Limits for tasks assessed      Coordination   Right 9 Hole Peg Test 29    Left 9 Hole Peg Test 27      Strength   Overall Strength Comments BUE strength: 4/5 right shoulder flexion, and abduction      Hand Function   Right Hand Grip (lbs) 60    Right Hand Lateral Pinch 18 lbs    Right Hand 3 Point Pinch 17 lbs    Left Hand Grip (lbs) 52    Left Hand Lateral Pinch 18 lbs    Left 3 point pinch 19 lbs                              OT Education - 06/02/21 1304     Education Details OT services, POC, goals    Person(s) Educated Patient    Methods Explanation    Comprehension Verbalized understanding              OT Short Term Goals - 06/02/21 1329       OT SHORT TERM GOAL #1   Title Pt. will improve FOTO score by 2 points to reflect improvements with ADLs/ IADL tasks.    Baseline Eval: FOTO scre: 56, TR score: 70    Time 6    Period Weeks    Status New    Target Date 07/14/21               OT Long Term Goals - 06/02/21 1332       OT LONG TERM GOAL #1   Title Pt. will improve right shoulder  strength by 2 mm grades to be able to lift, and carry items.    Baseline Eval:  right shoulder flexion, abduction 4/5    Time 12    Period Weeks    Status New    Target Date 08/25/21      OT LONG TERM GOAL #2   Title Pt. improve hand strength to be able to independently cut food.    Baseline Eval: Pt. has difficulty cutting food.    Time 12    Period Weeks    Target Date 08/25/21      OT LONG TERM GOAL #3   Title Pt. will improve [facility] skills in order to be able to open packets efficiently.    Baseline Eval: Pt. has difficulty opening packetss.    Time 12    Period Weeks    Status New    Target Date 08/25/21      OT LONG TERM GOAL #4   Title Pt. will improve RUE motor control skills to be able to independently, and efficiently brush her hair.    Baseline Eval: Pt. has difficulty using her right hand to brush her hair efficiently.    Time 12    Period Weeks    Status New    Target Date 08/25/21      OT LONG TERM GOAL #5   Title Pt. will improve activity tolerance skills to be able to perform light homemaking tasks independently, and efficiently using energy conservation, and work simplification strategies as needed.  Baseline Eval:    Time 12    Period Weeks    Status New    Target Date 08/25/21                   Plan - 06/02/21 1313     Clinical Impression Statement Pt. is a 45 y.o. female who was diagnosed with an early subacute left caudate tail ischemic CVA. Pt. presents wtih decreased activity tolerance,  decreased strength in the right shoulder for flexion/abduction, impaired grip strength, pinch strength, and [facility] skills which limit her ability to perform daily ADL, and IADL tasks. FOTO score 56, TR score 70. Pt. will benefit from OT services to work on improving  activity tolerance, right UE strength, and hand function skills in order to work towards improving RUE functioning, and maximizing independence with ADLs, and IADL tasks and to be able to open  packages, cut food, and manipulate small objects.    OT Occupational Profile and History Detailed Assessment- Review of Records and additional review of physical, cognitive, psychosocial history related to current functional performance    Occupational performance deficits (Please refer to evaluation for details): ADL's;IADL's    Body Structure / Function / Physical Skills ADL;FMC;IADL    Rehab Potential Poor    Clinical Decision Making Several treatment options, min-mod task modification necessary    Comorbidities Affecting Occupational Performance: May have comorbidities impacting occupational performance    Modification or Assistance to Complete Evaluation  Min-Moderate modification of tasks or assist with assess necessary to complete eval    OT Frequency 2x / week    OT Duration 12 weeks    OT Treatment/Interventions Self-care/ADL training;Patient/family education;Therapeutic activities;Neuromuscular education;Therapeutic exercise;DME and/or AE instruction    Consulted and Agree with Plan of Care Patient             Patient will benefit from skilled therapeutic intervention in order to improve the following deficits and impairments:   Body Structure / Function / Physical Skills: ADL, [place], IADL       Visit Diagnosis: Muscle weakness (generalized)  Other lack of coordination    Problem List Patient Active Problem List   Diagnosis Date Noted   CVA (cerebral vascular accident) ([place]) 05/24/2021   Stroke ([place]) 05/24/2021   Obesity, Class III, BMI 40-49.9 (morbid obesity) ([place]) 05/24/2021   Right ureteral stone 02/03/2015   Hydronephrosis, right 02/03/2015   Gross hematuria 02/03/2015   Compulsive tobacco user syndrome 02/02/2015   Biliary calculi 12/16/2013   Clinical depression 12/16/2013   Gastro-esophageal reflux disease without esophagitis 12/16/2013   Headache, migraine 12/16/2013   Unspecified vitamin D deficiency 01/13/2013   Hidradenitis 01/13/2013    Harrel Carina, MS, OTR/L 06/02/2021, 1:48 PM  [place] MAIN [facility] SERVICES [address] [place], [place], [address] Phone: [phone]   Fax:  [phone]  Name: Tanya Henderson MRN: [id number] Date of Birth: [birth year]

## 2021-06-07 ENCOUNTER — Ambulatory Visit: Payer: Commercial Managed Care - PPO | Admitting: Occupational Therapy

## 2021-06-09 ENCOUNTER — Emergency Department (HOSPITAL_COMMUNITY): Payer: Commercial Managed Care - PPO

## 2021-06-09 ENCOUNTER — Other Ambulatory Visit: Payer: Self-pay

## 2021-06-09 ENCOUNTER — Ambulatory Visit: Payer: Commercial Managed Care - PPO | Admitting: Occupational Therapy

## 2021-06-09 ENCOUNTER — Encounter (HOSPITAL_COMMUNITY): Payer: Self-pay | Admitting: *Deleted

## 2021-06-09 ENCOUNTER — Emergency Department (HOSPITAL_COMMUNITY)
Admission: EM | Admit: 2021-06-09 | Discharge: 2021-06-09 | Disposition: A | Discharge status: Home / Self Care | Payer: Commercial Managed Care - PPO | Attending: Emergency Medicine | Admitting: Emergency Medicine

## 2021-06-09 DIAGNOSIS — N189 Chronic kidney disease, unspecified: Secondary | ICD-10-CM | POA: Insufficient documentation

## 2021-06-09 DIAGNOSIS — R079 Chest pain, unspecified: Secondary | ICD-10-CM | POA: Insufficient documentation

## 2021-06-09 DIAGNOSIS — Z87891 Personal history of nicotine dependence: Secondary | ICD-10-CM | POA: Insufficient documentation

## 2021-06-09 DIAGNOSIS — M7918 Myalgia, other site: Secondary | ICD-10-CM | POA: Diagnosis not present

## 2021-06-09 DIAGNOSIS — R0602 Shortness of breath: Secondary | ICD-10-CM | POA: Insufficient documentation

## 2021-06-09 DIAGNOSIS — Z8673 Personal history of transient ischemic attack (TIA), and cerebral infarction without residual deficits: Secondary | ICD-10-CM | POA: Insufficient documentation

## 2021-06-09 DIAGNOSIS — R748 Abnormal levels of other serum enzymes: Secondary | ICD-10-CM

## 2021-06-09 DIAGNOSIS — Z7902 Long term (current) use of antithrombotics/antiplatelets: Secondary | ICD-10-CM | POA: Diagnosis not present

## 2021-06-09 DIAGNOSIS — Z20822 Contact with and (suspected) exposure to covid-19: Secondary | ICD-10-CM | POA: Insufficient documentation

## 2021-06-09 DIAGNOSIS — R945 Abnormal results of liver function studies: Secondary | ICD-10-CM | POA: Insufficient documentation

## 2021-06-09 DIAGNOSIS — Z7982 Long term (current) use of aspirin: Secondary | ICD-10-CM | POA: Insufficient documentation

## 2021-06-09 DIAGNOSIS — R509 Fever, unspecified: Secondary | ICD-10-CM | POA: Diagnosis not present

## 2021-06-09 LAB — COMPREHENSIVE METABOLIC PANEL
ALT: 334 U/L — ABNORMAL HIGH (ref 0–44)
AST: 256 U/L — ABNORMAL HIGH (ref 15–41)
Albumin: 3.7 g/dL (ref 3.5–5.0)
Alkaline Phosphatase: 486 U/L — ABNORMAL HIGH (ref 38–126)
Anion gap: 13 (ref 5–15)
BUN: 9 mg/dL (ref 6–20)
CO2: 21 mmol/L — ABNORMAL LOW (ref 22–32)
Calcium: 8.5 mg/dL — ABNORMAL LOW (ref 8.9–10.3)
Chloride: 100 mmol/L (ref 98–111)
Creatinine, Ser: 0.72 mg/dL (ref 0.44–1.00)
GFR, Estimated: >60 mL/min (ref >60)
Glucose, Bld: 123 mg/dL — ABNORMAL HIGH (ref 70–99)
Potassium: 4 mmol/L (ref 3.5–5.1)
Sodium: 134 mmol/L — ABNORMAL LOW (ref 135–145)
Total Bilirubin: 1.5 mg/dL — ABNORMAL HIGH (ref 0.3–1.2)
Total Protein: 7.6 g/dL (ref 6.5–8.1)

## 2021-06-09 LAB — CBC WITH DIFFERENTIAL/PLATELET
Abs Immature Granulocytes: 0.07 10*3/uL (ref 0.00–0.07)
Basophils Absolute: 0 10*3/uL (ref 0.0–0.1)
Basophils Relative: 0 %
Eosinophils Absolute: 0.6 10*3/uL — ABNORMAL HIGH (ref 0.0–0.5)
Eosinophils Relative: 7 %
HCT: 46.7 % — ABNORMAL HIGH (ref 36.0–46.0)
Hemoglobin: 15 g/dL (ref 12.0–15.0)
Immature Granulocytes: 1 %
Lymphocytes Relative: 7 %
Lymphs Abs: 0.5 10*3/uL — ABNORMAL LOW (ref 0.7–4.0)
MCH: 29.4 pg (ref 26.0–34.0)
MCHC: 32.1 g/dL (ref 30.0–36.0)
MCV: 91.4 fL (ref 80.0–100.0)
Monocytes Absolute: 0.4 10*3/uL (ref 0.1–1.0)
Monocytes Relative: 5 %
Neutro Abs: 6.5 10*3/uL (ref 1.7–7.7)
Neutrophils Relative %: 80 %
Platelets: 306 10*3/uL (ref 150–400)
RBC: 5.11 MIL/uL (ref 3.87–5.11)
RDW: 13.1 % (ref 11.5–15.5)
WBC: 8.1 10*3/uL (ref 4.0–10.5)
nRBC: 0 % (ref 0.0–0.2)

## 2021-06-09 LAB — URINALYSIS, ROUTINE W REFLEX MICROSCOPIC
Bilirubin Urine: NEGATIVE
Glucose, UA: NEGATIVE mg/dL
Hgb urine dipstick: NEGATIVE
Ketones, ur: NEGATIVE mg/dL
Leukocytes,Ua: NEGATIVE
Nitrite: NEGATIVE
Protein, ur: NEGATIVE mg/dL
Specific Gravity, Urine: 1.015 (ref 1.005–1.030)
pH: 7.5 (ref 5.0–8.0)

## 2021-06-09 LAB — RESP PANEL BY RT-PCR (FLU A&B, COVID) ARPGX2
Influenza A by PCR: NEGATIVE
Influenza B by PCR: NEGATIVE
SARS Coronavirus 2 by RT PCR: NEGATIVE

## 2021-06-09 LAB — D-DIMER, QUANTITATIVE: D-Dimer, Quant: 2.17 ug/mL-FEU — ABNORMAL HIGH (ref 0.00–0.50)

## 2021-06-09 MED ORDER — DOXYCYCLINE HYCLATE 100 MG PO CAPS: 100.0000 mg | ORAL_CAPSULE | Freq: Two times a day (BID) | ORAL | 0 refills | Status: AC

## 2021-06-09 MED ORDER — IOHEXOL 350 MG/ML SOLN
75.0000 mL | Freq: Once | INTRAVENOUS | Status: AC | PRN
  Administered 2021-06-09: 20:00:00 75 mL via INTRAVENOUS

## 2021-06-09 MED ORDER — ACETAMINOPHEN 325 MG PO TABS
650.0000 mg | ORAL_TABLET | Freq: Once | ORAL | Status: AC
  Administered 2021-06-09: 18:00:00 650 mg via ORAL
  Filled 2021-06-09: qty 2

## 2021-06-09 MED ORDER — OXYCODONE-ACETAMINOPHEN 5-325 MG PO TABS
1.0000 | ORAL_TABLET | Freq: Once | ORAL | Status: AC
  Administered 2021-06-09: 22:00:00 1 via ORAL
  Filled 2021-06-09: qty 1

## 2021-06-09 MED ORDER — SODIUM CHLORIDE 0.9 % IV SOLN
1.0000 g | Freq: Once | INTRAVENOUS | Status: AC
  Administered 2021-06-09: 21:00:00 1 g via INTRAVENOUS
  Filled 2021-06-09: qty 10

## 2021-06-09 NOTE — ED Provider Notes (Signed)
Emergency Medicine Provider Triage Evaluation Note  Tanya Henderson , a 45 y.o. female  was evaluated in triage.  Pt complains of general malaise, myalgias, fever, chills, and shortness of breath that began earlier this week.  Review of Systems  Positive:  Negative: Chest pain, abdominal pain, nausea, vomiting, diarrhea.  Physical Exam  BP (!) 147/99 (BP Location: Right Arm)    Pulse (!) 111    Temp (!) 100.9 F (38.3 C) (Oral)    Resp 16    SpO2 97%  Gen:   Awake, no distress   Resp:  Normal effort  MSK:   Moves extremities without difficulty  Other:    Medical Decision Making  Medically screening exam initiated at 5:07 PM.  Appropriate orders placed.  Tanya Henderson was informed that the remainder of the evaluation will be completed by another provider, this initial triage assessment does not replace that evaluation, and the importance of remaining in the ED until their evaluation is complete.     Tanya Lower, PA-C 06/09/21 1707    Tanya Cockayne, MD 06/09/21 2026

## 2021-06-09 NOTE — Discharge Instructions (Signed)
Call your primary care doctor or specialist as discussed in the next 2-3 days.   Return immediately back to the ER if:  Your symptoms worsen within the next 12-24 hours. You develop new symptoms such as new fevers, persistent vomiting, new pain, shortness of breath, or new weakness or numbness, or if you have any other concerns.  

## 2021-06-09 NOTE — ED Provider Notes (Addendum)
[facility] EMERGENCY DEPARTMENT Provider Note   CSN: [id number] Arrival date & time: 06/09/21  1544     History Chief Complaint  Patient presents with   Fever    Tanya Henderson is a 45 y.o. female.  Patient presents ER chief complaint of for 5 days of generalized body aches and pains fevers and chest pain.  She said she was recently diagnosed with a stroke and has been taking Plavix and aspirin.  Denies any vomiting or diarrhea.  Denies any specific localization of her pain but states that her "whole body hurts."      Past Medical History:  Diagnosis Date   Anxiety    Bronchitis    Chronic kidney disease    Kidney stones   Depression    GERD (gastroesophageal reflux disease)    Headache    Renal calculi    Vitamin D deficiency     Patient Active Problem List   Diagnosis Date Noted   CVA (cerebral vascular accident) ([place]) 05/24/2021   Stroke ([place]) 05/24/2021   Obesity, Class III, BMI 40-49.9 (morbid obesity) ([place]) 05/24/2021   Right ureteral stone 02/03/2015   Hydronephrosis, right 02/03/2015   Gross hematuria 02/03/2015   Compulsive tobacco user syndrome 02/02/2015   Biliary calculi 12/16/2013   Clinical depression 12/16/2013   Gastro-esophageal reflux disease without esophagitis 12/16/2013   Headache, migraine 12/16/2013   Unspecified vitamin D deficiency 01/13/2013   Hidradenitis 01/13/2013    Past Surgical History:  Procedure Laterality Date   CHOLECYSTECTOMY     CYSTOSCOPY W/ URETERAL STENT PLACEMENT Right 09/21/2015   Procedure: CYSTOSCOPY WITH STENT REPLACEMENT;  Surgeon: Hollice Espy, MD;  Location: [facility];  Service: Urology;  Laterality: Right;   CYSTOSCOPY W/ URETERAL STENT REMOVAL Right 02/10/2015   Procedure: CYSTOSCOPY WITH STENT REMOVAL;  Surgeon: Collier Flowers, MD;  Location: [facility];  Service: Urology;  Laterality: Right;   CYSTOSCOPY WITH STENT PLACEMENT Right 01/29/2015   Procedure: CYSTOSCOPY WITH STENT PLACEMENT;  Surgeon: Ardis Hughs, MD;  Location: [facility];  Service: Urology;  Laterality: Right;   CYSTOSCOPY WITH STENT PLACEMENT Right 02/10/2015   Procedure: CYSTOSCOPY WITH STENT PLACEMENT;  Surgeon: Collier Flowers, MD;  Location: [facility];  Service: Urology;  Laterality: Right;   CYSTOSCOPY WITH STENT PLACEMENT Right 09/07/2015   Procedure: CYSTOSCOPY WITH STENT PLACEMENT;  Surgeon: Ardis Hughs, MD;  Location: [facility];  Service: Urology;  Laterality: Right;   CYSTOSCOPY/RETROGRADE/URETEROSCOPY/STONE EXTRACTION WITH BASKET  05/26/2015   Procedure: CYSTOSCOPY/RETROGRADE/URETEROSCOPY/STONE EXTRACTION WITH BASKET;  Surgeon: Hollice Espy, MD;  Location: [facility];  Service: Urology;;   CYSTOSCOPY/URETEROSCOPY/HOLMIUM LASER  05/26/2015   Procedure: CYSTOSCOPY/URETEROSCOPY/HOLMIUM LASER;  Surgeon: Hollice Espy, MD;  Location: [facility];  Service: Urology;;   EXTRACORPOREAL SHOCK WAVE LITHOTRIPSY Right 04/22/2015   Procedure: EXTRACORPOREAL SHOCK WAVE LITHOTRIPSY (ESWL);  Surgeon: Nickie Retort, MD;  Location: [facility];  Service: Urology;  Laterality: Right;   KIDNEY STONE SURGERY     OOPHORECTOMY Right 1996   Ovarian cyst   URETEROSCOPY WITH HOLMIUM LASER LITHOTRIPSY Right 02/10/2015   Procedure: URETEROSCOPY WITH HOLMIUM LASER LITHOTRIPSY;  Surgeon: Collier Flowers, MD;  Location: [facility];  Service: Urology;  Laterality: Right;   URETEROSCOPY WITH HOLMIUM LASER LITHOTRIPSY Right 09/21/2015   Procedure: URETEROSCOPY WITH HOLMIUM LASER LITHOTRIPSY/ fragmentation and removal;  Surgeon: Hollice Espy, MD;  Location: [facility];  Service: Urology;  Laterality: Right;     OB History   No obstetric history on file.  Family History  Problem Relation Age of Onset   Lupus Mother    Hypertension Mother    Nephrolithiasis Paternal Grandfather    Breast cancer Neg Hx     Social History   Tobacco Use   Smoking status: Former   Smokeless tobacco: Never  Substance Use Topics   Alcohol use: Yes    Comment:  rare   Drug use: No    Home Medications Prior to Admission medications   Medication Sig Start Date End Date Taking? Authorizing Provider  aspirin EC 81 MG EC tablet Take 1 tablet (81 mg total) by mouth daily. Swallow whole. 05/26/21   Val Riles, MD  atorvastatin (LIPITOR) 40 MG tablet Take 1 tablet (40 mg total) by mouth every evening. 05/25/21 08/23/21  Val Riles, MD  BIOTIN PO Take by mouth daily.    [provider]  clopidogrel (PLAVIX) 75 MG tablet Take 1 tablet (75 mg total) by mouth daily for 21 days. 05/25/21 06/15/21  Val Riles, MD  losartan (COZAAR) 25 MG tablet Take 1 tablet (25 mg total) by mouth daily. Skip dose if systolic BP less than 712 mmHg 05/25/21   Val Riles, MD  omeprazole (PRILOSEC) 20 MG capsule Take 1 capsule (20 mg total) by mouth daily. 01/15/19   Kendell Bane, NP  VITAMIN D, CHOLECALCIFEROL, PO Take by mouth daily.    [provider]    Allergies    Clindamycin/lincomycin and Penicillins  Review of Systems   Review of Systems  Constitutional:  Negative for fever.  HENT:  Negative for ear pain.   Eyes:  Negative for pain.  Respiratory:  Positive for cough.   Cardiovascular:  Positive for chest pain.  Gastrointestinal:  Positive for abdominal pain.  Genitourinary:  Positive for flank pain.  Musculoskeletal:  Positive for back pain.  Skin:  Negative for rash.  Neurological:  Positive for headaches.   Physical Exam Updated Vital Signs BP (!) 152/135    Pulse (!) 108    Temp 99.5 F (37.5 C) (Oral)    Resp 18    SpO2 95%   Physical Exam Constitutional:      Appearance: Normal appearance.     Comments: Appears fatigued  HENT:     Head: Normocephalic.     Nose: Nose normal.  Eyes:     Extraocular Movements: Extraocular movements intact.  Neck:     Comments: No meningismal signs. Cardiovascular:     Rate and Rhythm: Normal rate.  Pulmonary:     Effort: Pulmonary effort is normal.  Abdominal:     Tenderness: There  is no abdominal tenderness. There is no guarding or rebound.  Musculoskeletal:        General: Normal range of motion.     Cervical back: Normal range of motion. No rigidity.  Neurological:     General: No focal deficit present.     Mental Status: She is alert and oriented to person, place, and time. Mental status is at baseline.     Cranial Nerves: No cranial nerve deficit.     Motor: No weakness.     Gait: Gait normal.    ED Results / Procedures / Treatments   Labs (all labs ordered are listed, but only abnormal results are displayed) Labs Reviewed  COMPREHENSIVE METABOLIC PANEL - Abnormal; Notable for the following components:      Result Value   Sodium 134 (*)    CO2 21 (*)    Glucose, Bld 123 (*)  Calcium 8.5 (*)    AST 256 (*)    ALT 334 (*)    Alkaline Phosphatase 486 (*)    Total Bilirubin 1.5 (*)    All other components within normal limits  CBC WITH DIFFERENTIAL/PLATELET - Abnormal; Notable for the following components:   HCT 46.7 (*)    Lymphs Abs 0.5 (*)    Eosinophils Absolute 0.6 (*)    All other components within normal limits  D-DIMER, QUANTITATIVE - Abnormal; Notable for the following components:   D-Dimer, Quant 2.17 (*)    All other components within normal limits  RESP PANEL BY RT-PCR (FLU A&B, COVID) [id number]  URINALYSIS, ROUTINE W REFLEX MICROSCOPIC    EKG None  Radiology DG Chest 2 View  Result Date: 06/09/2021 CLINICAL DATA:  Fever, shortness of breath, body aches EXAM: CHEST - 2 VIEW COMPARISON:  05/24/2021 FINDINGS: The heart size and mediastinal contours are within normal limits. Both lungs are clear. The visualized skeletal structures are unremarkable. IMPRESSION: No active cardiopulmonary disease. Electronically Signed   By: Kathreen Devoid M.D.   On: 06/09/2021 18:02   CT Angio Chest PE W and/or Wo Contrast  Result Date: 06/09/2021 CLINICAL DATA:  Generalized malaise with fever, chills and shortness of breath. EXAM: CT ANGIOGRAPHY CHEST  WITH CONTRAST TECHNIQUE: Multidetector CT imaging of the chest was performed using the standard protocol during bolus administration of intravenous contrast. Multiplanar CT image reconstructions and MIPs were obtained to evaluate the vascular anatomy. CONTRAST:  37m OMNIPAQUE IOHEXOL 350 MG/ML SOLN COMPARISON:  None. FINDINGS: Cardiovascular: The main pulmonary artery measures 3.2 cm. The pulmonary arteries are limited in evaluation secondary to suboptimal opacification with intravenous contrast. No evidence of pulmonary embolism. Normal heart size. No pericardial effusion. Mediastinum/Nodes: No enlarged mediastinal, hilar, or axillary lymph nodes. A 1.7 cm x 1.3 cm lymph node is suspected along the right heart border (axial CT image 59, CT series 4) thyroid gland, trachea, and esophagus demonstrate no significant findings. Lungs/Pleura: There is no evidence of acute infiltrate, pleural effusion or pneumothorax. Upper Abdomen: Surgical clips are seen within the gallbladder fossa. Musculoskeletal: No chest wall abnormality. No acute or significant osseous findings. Review of the MIP images confirms the above findings. IMPRESSION: 1. No evidence of pulmonary embolism or other acute intrathoracic process. 2. Enlarged main pulmonary artery, which can be seen in the setting of pulmonary arterial hypertension. 3. Evidence of prior cholecystectomy. Electronically Signed   By: TVirgina NorfolkM.D.   On: 06/09/2021 20:00   CT Renal Stone Study  Result Date: 06/09/2021 CLINICAL DATA:  Flank pain. EXAM: CT ABDOMEN AND PELVIS WITHOUT CONTRAST TECHNIQUE: Multidetector CT imaging of the abdomen and pelvis was performed following the standard protocol without IV contrast. COMPARISON:  CT abdomen and pelvis 09/05/2015. FINDINGS: Lower chest: No acute abnormality. Hepatobiliary: No focal liver abnormality is seen. Status post cholecystectomy. No biliary dilatation. Pancreas: Unremarkable. No pancreatic ductal dilatation or  surrounding inflammatory changes. Spleen: Mildly enlarged. Adrenals/Urinary Tract: There are punctate nonobstructing bilateral renal calculi. Kidneys are otherwise within normal limits. No ureteral calculi are seen. There is no hydronephrosis. The adrenal glands and bladder are within normal limits. Stomach/Bowel: Stomach is within normal limits. Appendix appears normal. No evidence of bowel wall thickening, distention, or inflammatory changes. Vascular/Lymphatic: Aorta and IVC are normal in size. There are new enlarged portacaval and periportal lymph nodes measuring up to 3.3 x 1.3 cm. Reproductive: The uterus appears slightly lobulated in contour and mildly prominent. Ovaries are grossly within normal  limits. Other: There is a small fat containing umbilical hernia. There is no free fluid or free air. Musculoskeletal: No acute or significant osseous findings. IMPRESSION: 1. Bilateral nonobstructing renal calculi. 2. Mild splenomegaly. 3. New periportal and portacaval lymphadenopathy of uncertain etiology. Malignancy or metastatic disease not excluded. 4. Findings suspicious for fibroid uterus. This can be further evaluated with dedicated pelvic ultrasound. Electronically Signed   By: Ronney Asters M.D.   On: 06/09/2021 20:04    Procedures Procedures   Medications Ordered in ED Medications  acetaminophen (TYLENOL) tablet 650 mg (650 mg Oral Given 06/09/21 1824)  iohexol (OMNIPAQUE) 350 MG/ML injection 75 mL (75 mLs Intravenous Contrast Given 06/09/21 1937)    ED Course  I have reviewed the triage vital signs and the nursing notes.  Pertinent labs & imaging results that were available during my care of the patient were reviewed by me and considered in my medical decision making (see chart for details).    MDM Rules/Calculators/A&P                         Patient presents with fever and generalized body aches and malaise.  Labs are sent white count normal chemistry normal.  Viral panel also  negative.  Urinalysis shows no evidence of infection.  CT angio of the chest pursued as D-dimer is elevated.  CT angio shows no evidence of pulm embolism, but concerning for intrathoracic lymphadenopathy.  Liver enzymes also noted to be elevated.  T bili 1.5 alk phos 46.  Patient advised regarding importance of follow-up with her primary care doctor within the next 2 to 3 days.  Advising immediate return if she has persistent fevers, worsening pain worsening symptoms or any additional concerns.    Final Clinical Impression(s) / ED Diagnoses Final diagnoses:  SOB (shortness of breath)    Rx / DC Orders ED Discharge Orders     None        Luna Fuse, MD 06/09/21 2029    Luna Fuse, MD 06/09/21 2044

## 2021-06-09 NOTE — ED Triage Notes (Signed)
Fever, shortness of breath with body aches

## 2021-06-14 ENCOUNTER — Ambulatory Visit: Payer: Commercial Managed Care - PPO | Admitting: Occupational Therapy

## 2021-06-16 ENCOUNTER — Ambulatory Visit: Payer: Commercial Managed Care - PPO | Admitting: Occupational Therapy

## 2021-06-21 ENCOUNTER — Ambulatory Visit: Payer: Commercial Managed Care - PPO | Attending: Internal Medicine | Admitting: Occupational Therapy

## 2021-06-23 ENCOUNTER — Ambulatory Visit: Payer: Commercial Managed Care - PPO | Admitting: Occupational Therapy

## 2021-06-24 ENCOUNTER — Encounter: Payer: Self-pay | Admitting: *Deleted

## 2021-06-24 ENCOUNTER — Other Ambulatory Visit: Payer: Self-pay | Admitting: *Deleted

## 2021-06-27 ENCOUNTER — Ambulatory Visit: Payer: Commercial Managed Care - PPO | Admitting: Occupational Therapy

## 2021-06-28 ENCOUNTER — Other Ambulatory Visit: Payer: Self-pay

## 2021-06-28 ENCOUNTER — Inpatient Hospital Stay: Payer: Commercial Managed Care - PPO | Attending: Oncology | Admitting: Oncology

## 2021-06-28 ENCOUNTER — Encounter: Payer: Self-pay | Admitting: Oncology

## 2021-06-28 ENCOUNTER — Inpatient Hospital Stay: Payer: Commercial Managed Care - PPO

## 2021-06-28 VITALS — BP 140/86 | HR 75 | Temp 98.0°F | Resp 18 | Ht 64.0 in | Wt 272.0 lb

## 2021-06-28 DIAGNOSIS — R760 Raised antibody titer: Secondary | ICD-10-CM

## 2021-06-28 DIAGNOSIS — D6861 Antiphospholipid syndrome: Secondary | ICD-10-CM | POA: Insufficient documentation

## 2021-06-28 NOTE — Progress Notes (Signed)
Hematology/Oncology Consult note [facility] [phone] [phone]  Patient Care Team: Tracie Harrier, MD as PCP - General (Internal Medicine) Sindy Guadeloupe, MD as Consulting Physician (Hematology and Oncology) Schermerhorn, Gwen Her, MD as Consulting Physician (Obstetrics and Gynecology) Erven Colla as Referring Physician (Physician Assistant)   Name of the patient: Tanya Henderson  [id number]  1975/10/22    Reason for referral-abnormal anticardiolipin antibody panel   Referring physician-Sarah Cornelia Copa, PA  Date of visit: 06/28/21   History of presenting illness-patient is a 46 year old female who was admitted to [facility] with symptoms ofRight-sided weakness.  Her past medical history significant for hypertension hyperlipidemia, morbid obesity, anxiety depression GERD.  CT was concerning for early subacute stroke MRI of the head and neck showed area of early subacute ischemia in the left caudate tail.  As a part of her stroke work-up she had a hypercoagulable panel done which showed normal protein C, protein S, Antithrombin III levels.  No evidence of factor V Leiden and prothrombin gene mutation.  Antiphospholipid antibody syndrome panel did not show any evidence of lupus anticoagulant and beta-2 glycoprotein levels were normal.  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  Patient has been referred to [place] for the same.  She has also been seen by outpatient neurology.  Presently she is off Plavix and is on low-dose aspirin alone.  She reports fatigue but denies other complaints at this time  ECOG PS- 1  Pain scale- 0   Review of systems- Review of Systems  Constitutional:  Positive for malaise/fatigue. Negative for chills, fever and weight loss.  HENT:  Negative for congestion, ear discharge and nosebleeds.   Eyes:  Negative for blurred vision.  Respiratory:  Negative for cough, hemoptysis, sputum  production, shortness of breath and wheezing.   Cardiovascular:  Negative for chest pain, palpitations, orthopnea and claudication.  Gastrointestinal:  Negative for abdominal pain, blood in stool, constipation, diarrhea, heartburn, melena, nausea and vomiting.  Genitourinary:  Negative for dysuria, flank pain, frequency, hematuria and urgency.  Musculoskeletal:  Negative for back pain, joint pain and myalgias.  Skin:  Negative for rash.  Neurological:  Negative for dizziness, tingling, focal weakness, seizures, weakness and headaches.  Endo/Heme/Allergies:  Does not bruise/bleed easily.  Psychiatric/Behavioral:  Negative for depression and suicidal ideas. The patient does not have insomnia.    Allergies  Allergen Reactions   Clindamycin/Lincomycin    Penicillins Other (See Comments)    Reaction:  Unknown     Patient Active Problem List   Diagnosis Date Noted   CVA (cerebral vascular accident) ([place]) 05/24/2021   Stroke ([place]) 05/24/2021   Obesity, Class III, BMI 40-49.9 (morbid obesity) ([place]) 05/24/2021   Right ureteral stone 02/03/2015   Hydronephrosis, right 02/03/2015   Gross hematuria 02/03/2015   Compulsive tobacco user syndrome 02/02/2015   Biliary calculi 12/16/2013   Clinical depression 12/16/2013   Gastro-esophageal reflux disease without esophagitis 12/16/2013   Headache, migraine 12/16/2013   Unspecified vitamin D deficiency 01/13/2013   Hidradenitis 01/13/2013     Past Medical History:  Diagnosis Date   Abnormal LFTs    Anxiety    B12 deficiency    Bronchitis    Cholelithiasis 12/16/2013   Chronic kidney disease    Kidney stones   Depression 12/16/2013   GERD (gastroesophageal reflux disease) 12/16/2013   Headache    Hidradenitis    History of smoking    History of stroke    History  of stroke 05/23/2021   Migraine 12/16/2013   Renal calculi    Vitamin D deficiency      Past Surgical History:  Procedure Laterality Date   CHOLECYSTECTOMY      CYSTOSCOPY W/ URETERAL STENT PLACEMENT Right 09/21/2015   Procedure: CYSTOSCOPY WITH STENT REPLACEMENT;  Surgeon: Hollice Espy, MD;  Location: [facility];  Service: Urology;  Laterality: Right;   CYSTOSCOPY W/ URETERAL STENT REMOVAL Right 02/10/2015   Procedure: CYSTOSCOPY WITH STENT REMOVAL;  Surgeon: Collier Flowers, MD;  Location: [facility];  Service: Urology;  Laterality: Right;   CYSTOSCOPY WITH STENT PLACEMENT Right 01/29/2015   Procedure: CYSTOSCOPY WITH STENT PLACEMENT;  Surgeon: Ardis Hughs, MD;  Location: [facility];  Service: Urology;  Laterality: Right;   CYSTOSCOPY WITH STENT PLACEMENT Right 02/10/2015   Procedure: CYSTOSCOPY WITH STENT PLACEMENT;  Surgeon: Collier Flowers, MD;  Location: [facility];  Service: Urology;  Laterality: Right;   CYSTOSCOPY WITH STENT PLACEMENT Right 09/07/2015   Procedure: CYSTOSCOPY WITH STENT PLACEMENT;  Surgeon: Ardis Hughs, MD;  Location: [facility];  Service: Urology;  Laterality: Right;   CYSTOSCOPY/RETROGRADE/URETEROSCOPY/STONE EXTRACTION WITH BASKET  05/26/2015   Procedure: CYSTOSCOPY/RETROGRADE/URETEROSCOPY/STONE EXTRACTION WITH BASKET;  Surgeon: Hollice Espy, MD;  Location: [facility];  Service: Urology;;   CYSTOSCOPY/URETEROSCOPY/HOLMIUM LASER  05/26/2015   Procedure: CYSTOSCOPY/URETEROSCOPY/HOLMIUM LASER;  Surgeon: Hollice Espy, MD;  Location: [facility];  Service: Urology;;   EXTRACORPOREAL SHOCK WAVE LITHOTRIPSY Right 04/22/2015   Procedure: EXTRACORPOREAL SHOCK WAVE LITHOTRIPSY (ESWL);  Surgeon: Nickie Retort, MD;  Location: [facility];  Service: Urology;  Laterality: Right;   KIDNEY STONE SURGERY     OOPHORECTOMY Right 1996   Ovarian cyst   URETEROSCOPY WITH HOLMIUM LASER LITHOTRIPSY Right 02/10/2015   Procedure: URETEROSCOPY WITH HOLMIUM LASER LITHOTRIPSY;  Surgeon: Collier Flowers, MD;  Location: [facility];  Service: Urology;  Laterality: Right;   URETEROSCOPY WITH HOLMIUM LASER LITHOTRIPSY Right 09/21/2015   Procedure: URETEROSCOPY WITH  HOLMIUM LASER LITHOTRIPSY/ fragmentation and removal;  Surgeon: Hollice Espy, MD;  Location: [facility];  Service: Urology;  Laterality: Right;    Social History   Socioeconomic History   Marital status: Divorced    Spouse name: Not on file   Number of children: Not on file   Years of education: Not on file   Highest education level: Not on file  Occupational History   Not on file  Tobacco Use   Smoking status: Former    Packs/day: 1.00    Years: 15.00    Pack years: 15.00    Types: Cigarettes    Quit date: 06/25/2019    Years since quitting: 2.0    Passive exposure: Never   Smokeless tobacco: Never  Vaping Use   Vaping Use: Never used  Substance and Sexual Activity   Alcohol use: Yes    Comment: rare   Drug use: No   Sexual activity: Yes  Other Topics Concern   Not on file  Social History Narrative   CNA at [employer]   One son- 29 yo.   Social Determinants of Health   Financial Resource Strain: Not on file  Food Insecurity: Not on file  Transportation Needs: Not on file  Physical Activity: Not on file  Stress: Not on file  Social Connections: Not on file  Intimate Partner Violence: Not on file     Family History  Problem Relation Age of Onset   Lupus Mother    Hypertension Mother    Uterine cancer  Mother    Diabetes Paternal Grandmother    Nephrolithiasis Paternal Grandfather    Diabetes Maternal Aunt    Breast cancer Neg Hx      Current Outpatient Medications:    aspirin EC 81 MG EC tablet, Take 1 tablet (81 mg total) by mouth daily. Swallow whole., Disp: 30 tablet, Rfl: 11   BIOTIN PO, Take by mouth daily., Disp: , Rfl:    losartan (COZAAR) 25 MG tablet, Take 1 tablet (25 mg total) by mouth daily. Skip dose if systolic BP less than 130 mmHg, Disp: 30 tablet, Rfl: 2   Multiple Vitamin (MULTI-VITAMIN) tablet, Take 1 tablet by mouth daily., Disp: , Rfl:    omeprazole (PRILOSEC) 20 MG capsule, Take 1 capsule (20 mg total) by mouth daily., Disp: 30  capsule, Rfl: 3   Probiotic Product (ADVANCED PROBIOTIC-14) CAPS, Take 1 capsule by mouth daily., Disp: , Rfl:    VITAMIN D, CHOLECALCIFEROL, PO, Take by mouth daily., Disp: , Rfl:    atorvastatin (LIPITOR) 40 MG tablet, Take 1 tablet (40 mg total) by mouth every evening. (Patient not taking: Reported on 06/28/2021), Disp: 30 tablet, Rfl: 2   clobetasol ointment (TEMOVATE) 0.05 %, Apply 1 application topically 2 (two) times daily. (Patient not taking: Reported on 06/28/2021), Disp: , Rfl:    escitalopram (LEXAPRO) 10 MG tablet, Take 10 mg by mouth daily. (Patient not taking: Reported on 06/28/2021), Disp: , Rfl:    Physical exam:  Vitals:   06/28/21 1103  BP: 140/86  Pulse: 75  Resp: 18  Temp: 98 F (36.7 C)  TempSrc: Tympanic  SpO2: 98%  Weight: 272 lb (123.4 kg)  Height: 5\' 4"  (1.626 m)   Physical Exam Constitutional:      General: She is not in acute distress.    Appearance: She is obese.  Cardiovascular:     Rate and Rhythm: Normal rate and regular rhythm.     Heart sounds: Normal heart sounds.  Pulmonary:     Effort: Pulmonary effort is normal.     Breath sounds: Normal breath sounds.  Abdominal:     General: Bowel sounds are normal.     Palpations: Abdomen is soft.  Skin:    General: Skin is warm and dry.  Neurological:     General: No focal deficit present.     Mental Status: She is alert and oriented to person, place, and time.       CMP Latest Ref Rng & Units 06/09/2021  Glucose 70 - 99 mg/dL 336(P)  BUN 6 - 20 mg/dL 9  Creatinine 2.24 - 4.97 mg/dL 5.30  Sodium 051 - 102 mmol/L 134(L)  Potassium 3.5 - 5.1 mmol/L 4.0  Chloride 98 - 111 mmol/L 100  CO2 22 - 32 mmol/L 21(L)  Calcium 8.9 - 10.3 mg/dL 1.1(Z)  Total Protein 6.5 - 8.1 g/dL 7.6  Total Bilirubin 0.3 - 1.2 mg/dL 7.3(V)  Alkaline Phos 38 - 126 U/L 486(H)  AST 15 - 41 U/L 256(H)  ALT 0 - 44 U/L 334(H)   CBC Latest Ref Rng & Units 06/09/2021  WBC 4.0 - 10.5 K/uL 8.1  Hemoglobin 12.0 - 15.0 g/dL  67.0  Hematocrit 14.1 - 46.0 % 46.7(H)  Platelets 150 - 400 K/uL 306    No images are attached to the encounter.  DG Chest 2 View  Result Date: 06/09/2021 CLINICAL DATA:  Fever, shortness of breath, body aches EXAM: CHEST - 2 VIEW COMPARISON:  05/24/2021 FINDINGS: The heart size and mediastinal  contours are within normal limits. Both lungs are clear. The visualized skeletal structures are unremarkable. IMPRESSION: No active cardiopulmonary disease. Electronically Signed   By: Kathreen Devoid M.D.   On: 06/09/2021 18:02   CT Angio Chest PE W and/or Wo Contrast  Result Date: 06/09/2021 CLINICAL DATA:  Generalized malaise with fever, chills and shortness of breath. EXAM: CT ANGIOGRAPHY CHEST WITH CONTRAST TECHNIQUE: Multidetector CT imaging of the chest was performed using the standard protocol during bolus administration of intravenous contrast. Multiplanar CT image reconstructions and MIPs were obtained to evaluate the vascular anatomy. CONTRAST:  7mL OMNIPAQUE IOHEXOL 350 MG/ML SOLN COMPARISON:  None. FINDINGS: Cardiovascular: The main pulmonary artery measures 3.2 cm. The pulmonary arteries are limited in evaluation secondary to suboptimal opacification with intravenous contrast. No evidence of pulmonary embolism. Normal heart size. No pericardial effusion. Mediastinum/Nodes: No enlarged mediastinal, hilar, or axillary lymph nodes. A 1.7 cm x 1.3 cm lymph node is suspected along the right heart border (axial CT image 59, CT series 4) thyroid gland, trachea, and esophagus demonstrate no significant findings. Lungs/Pleura: There is no evidence of acute infiltrate, pleural effusion or pneumothorax. Upper Abdomen: Surgical clips are seen within the gallbladder fossa. Musculoskeletal: No chest wall abnormality. No acute or significant osseous findings. Review of the MIP images confirms the above findings. IMPRESSION: 1. No evidence of pulmonary embolism or other acute intrathoracic process. 2. Enlarged main  pulmonary artery, which can be seen in the setting of pulmonary arterial hypertension. 3. Evidence of prior cholecystectomy. Electronically Signed   By: Virgina Norfolk M.D.   On: 06/09/2021 20:00   CT Renal Stone Study  Result Date: 06/09/2021 CLINICAL DATA:  Flank pain. EXAM: CT ABDOMEN AND PELVIS WITHOUT CONTRAST TECHNIQUE: Multidetector CT imaging of the abdomen and pelvis was performed following the standard protocol without IV contrast. COMPARISON:  CT abdomen and pelvis 09/05/2015. FINDINGS: Lower chest: No acute abnormality. Hepatobiliary: No focal liver abnormality is seen. Status post cholecystectomy. No biliary dilatation. Pancreas: Unremarkable. No pancreatic ductal dilatation or surrounding inflammatory changes. Spleen: Mildly enlarged. Adrenals/Urinary Tract: There are punctate nonobstructing bilateral renal calculi. Kidneys are otherwise within normal limits. No ureteral calculi are seen. There is no hydronephrosis. The adrenal glands and bladder are within normal limits. Stomach/Bowel: Stomach is within normal limits. Appendix appears normal. No evidence of bowel wall thickening, distention, or inflammatory changes. Vascular/Lymphatic: Aorta and IVC are normal in size. There are new enlarged portacaval and periportal lymph nodes measuring up to 3.3 x 1.3 cm. Reproductive: The uterus appears slightly lobulated in contour and mildly prominent. Ovaries are grossly within normal limits. Other: There is a small fat containing umbilical hernia. There is no free fluid or free air. Musculoskeletal: No acute or significant osseous findings. IMPRESSION: 1. Bilateral nonobstructing renal calculi. 2. Mild splenomegaly. 3. New periportal and portacaval lymphadenopathy of uncertain etiology. Malignancy or metastatic disease not excluded. 4. Findings suspicious for fibroid uterus. This can be further evaluated with dedicated pelvic ultrasound. Electronically Signed   By: Ronney Asters M.D.   On: 06/09/2021  20:04    Assessment and plan- Patient is a 46 y.o. female with recent history of stroke referred for abnormal antiphospholipid antibody panel  The fact that her IgM anticardiolipin antibody titer is greater than 40 it is a value that would be considered significant.  However to diagnose antiphospholipid antibody syndrome in the setting of acute stroke we would need 2 sets of abnormal blood values 12 weeks apart.  She would therefore need a repeat  anticardiolipin antibody titer in early March 2023.  Presently it is unclear if patient truly has antiphospholipid antibody syndrome or not.  However given the fact that APS is associated with increased risk of strokes, cardiovascular events as well as DVTs and PE-I would be inclined to start patient on therapeutic anticoagulation until the diagnosis can be confirmed or negated in March 2023.  Especially in patients with antiphospholipid antibody syndrome Coumadin is found to be better than newer anticoagulants.  I will reach out to her outpatient neurology team Luella Cook and see if it would be okay with them if we start her on Coumadin.  They can decide about continuing versus stopping aspirin along with Coumadin.  Before starting Coumadin we will have to start her on daily Lovenox injections until INR is therapeutic and following that Lovenox can be discontinued.  Goal is to maintain INR between 2-3.  Discussed the potential for drug interactions on Coumadin with other medications including antibiotics.  The need to maintain a consistent diet while on Coumadin.  Eventually her anticardiolipin antibody titer in March 2023 is not greater than 40, it would be okay for her to stop Coumadin and continue antiplatelet therapy alone as per neurology recommendations.  I will see her sometime in mid March after the results of anticardiolipin antibody panel are back  Thank you for this kind referral and the opportunity to participate in the care of this  patient   Visit Diagnosis 1. Anticardiolipin antibody positive     Dr. Randa Evens, MD, MPH [facility] [id number] 06/28/2021

## 2021-06-29 ENCOUNTER — Ambulatory Visit: Payer: Commercial Managed Care - PPO | Admitting: Occupational Therapy

## 2021-06-29 ENCOUNTER — Ambulatory Visit: Payer: Commercial Managed Care - PPO | Admitting: Physical Therapy

## 2021-06-29 ENCOUNTER — Other Ambulatory Visit: Payer: Self-pay | Admitting: *Deleted

## 2021-06-29 ENCOUNTER — Encounter: Payer: Self-pay | Admitting: Oncology

## 2021-06-29 MED ORDER — APIXABAN 5 MG PO TABS: ORAL_TABLET | ORAL | 0 refills | Status: DC

## 2021-07-05 ENCOUNTER — Telehealth: Payer: Self-pay | Admitting: *Deleted

## 2021-07-05 ENCOUNTER — Ambulatory Visit: Payer: Commercial Managed Care - PPO

## 2021-07-05 NOTE — Telephone Encounter (Signed)
Error

## 2021-07-06 ENCOUNTER — Other Ambulatory Visit (HOSPITAL_COMMUNITY): Payer: Self-pay

## 2021-07-06 ENCOUNTER — Telehealth: Payer: Self-pay | Admitting: *Deleted

## 2021-07-06 NOTE — Telephone Encounter (Signed)
Called pt and asked how she is doing on the eliquis. She states that she just activated the card today. She will get med today and hopefully start it. I encouraged to do it today . She has already been 1 week an has not started it. I did warn her that if she had a bad fall and hits her head she should go to ER because the medicine makes your blood thin and you may not have symptoms that your head is hurting but it could be a bleed so always be mindful of that . If she falls and hits her ribs and and have big bruise should go to ER for same reason. Simple bumping into things that cause a bruise and it is ok. She will try to get the drug today and start

## 2021-07-06 NOTE — Telephone Encounter (Signed)
1/12 I checked with Darel Hong that helps with oral meds and high cost. The pt. Has private insurance and she could use copay card that is provided by the company that makes the eliquis. She was told to pick it up and call the number on the packet and once it is activated, she will get the drug.. for 10.00 a month.

## 2021-07-08 ENCOUNTER — Ambulatory Visit: Payer: Commercial Managed Care - PPO | Admitting: Physical Therapy

## 2021-07-12 ENCOUNTER — Ambulatory Visit: Payer: Commercial Managed Care - PPO

## 2021-07-12 ENCOUNTER — Encounter: Payer: Commercial Managed Care - PPO | Admitting: Occupational Therapy

## 2021-07-14 ENCOUNTER — Ambulatory Visit: Payer: Commercial Managed Care - PPO

## 2021-07-14 ENCOUNTER — Encounter: Payer: Commercial Managed Care - PPO | Admitting: Occupational Therapy

## 2021-07-19 ENCOUNTER — Ambulatory Visit: Payer: Commercial Managed Care - PPO

## 2021-07-19 ENCOUNTER — Encounter: Payer: Commercial Managed Care - PPO | Admitting: Occupational Therapy

## 2021-07-20 ENCOUNTER — Telehealth: Payer: Self-pay | Admitting: Dietician

## 2021-07-20 NOTE — Telephone Encounter (Signed)
Called Ms. Bou to reschedule her initial MNT appointment from 09/05/21, due to staff conflict. Rescheduled the appointment to 09/08/21 at 8:30am.

## 2021-07-21 ENCOUNTER — Ambulatory Visit: Payer: Commercial Managed Care - PPO

## 2021-07-21 ENCOUNTER — Encounter: Payer: Commercial Managed Care - PPO | Admitting: Occupational Therapy

## 2021-07-26 ENCOUNTER — Ambulatory Visit: Payer: Commercial Managed Care - PPO

## 2021-07-26 ENCOUNTER — Ambulatory Visit: Payer: Commercial Managed Care - PPO | Admitting: Physical Therapy

## 2021-07-26 ENCOUNTER — Encounter: Payer: Commercial Managed Care - PPO | Admitting: Occupational Therapy

## 2021-07-27 ENCOUNTER — Ambulatory Visit: Payer: Commercial Managed Care - PPO

## 2021-07-28 ENCOUNTER — Ambulatory Visit: Payer: Commercial Managed Care - PPO

## 2021-07-28 ENCOUNTER — Encounter: Payer: Commercial Managed Care - PPO | Admitting: Occupational Therapy

## 2021-08-02 ENCOUNTER — Encounter: Payer: Commercial Managed Care - PPO | Admitting: Occupational Therapy

## 2021-08-02 ENCOUNTER — Ambulatory Visit: Payer: Commercial Managed Care - PPO

## 2021-08-04 ENCOUNTER — Ambulatory Visit: Payer: Commercial Managed Care - PPO

## 2021-08-04 ENCOUNTER — Encounter: Payer: Commercial Managed Care - PPO | Admitting: Occupational Therapy

## 2021-08-08 ENCOUNTER — Ambulatory Visit: Payer: Commercial Managed Care - PPO

## 2021-08-09 ENCOUNTER — Ambulatory Visit: Payer: Commercial Managed Care - PPO

## 2021-08-09 ENCOUNTER — Other Ambulatory Visit: Payer: Self-pay | Admitting: *Deleted

## 2021-08-09 ENCOUNTER — Encounter: Payer: Commercial Managed Care - PPO | Admitting: Occupational Therapy

## 2021-08-09 ENCOUNTER — Telehealth: Payer: Self-pay | Admitting: *Deleted

## 2021-08-09 DIAGNOSIS — R760 Raised antibody titer: Secondary | ICD-10-CM

## 2021-08-09 MED ORDER — APIXABAN 5 MG PO TABS: 5.0000 mg | ORAL_TABLET | Freq: Two times a day (BID) | ORAL | 5 refills | Status: DC

## 2021-08-09 NOTE — Telephone Encounter (Signed)
Pt finished the taper eliquis and now needs rx for 5 mg bid . I put in the next rx and left refills. She has the 10 dollar card that she can use  to get the RX each month fr 10 dollars. When I called her back she did not answer and left the info above to her on voicemail

## 2021-08-11 ENCOUNTER — Ambulatory Visit: Payer: Commercial Managed Care - PPO

## 2021-08-11 ENCOUNTER — Other Ambulatory Visit: Payer: Self-pay | Admitting: Oncology

## 2021-08-11 ENCOUNTER — Encounter: Payer: Commercial Managed Care - PPO | Admitting: Occupational Therapy

## 2021-08-12 ENCOUNTER — Ambulatory Visit: Payer: Commercial Managed Care - PPO | Admitting: Physical Therapy

## 2021-08-15 ENCOUNTER — Ambulatory Visit: Payer: Commercial Managed Care - PPO

## 2021-08-16 ENCOUNTER — Ambulatory Visit: Payer: Commercial Managed Care - PPO

## 2021-08-16 ENCOUNTER — Other Ambulatory Visit: Payer: Self-pay

## 2021-08-16 ENCOUNTER — Inpatient Hospital Stay: Payer: Commercial Managed Care - PPO | Attending: Oncology

## 2021-08-16 ENCOUNTER — Encounter: Payer: Commercial Managed Care - PPO | Admitting: Occupational Therapy

## 2021-08-16 DIAGNOSIS — R760 Raised antibody titer: Secondary | ICD-10-CM | POA: Diagnosis not present

## 2021-08-16 LAB — CBC WITH DIFFERENTIAL/PLATELET
Abs Immature Granulocytes: 0.02 10*3/uL (ref 0.00–0.07)
Basophils Absolute: 0.1 10*3/uL (ref 0.0–0.1)
Basophils Relative: 1 %
Eosinophils Absolute: 0.6 10*3/uL — ABNORMAL HIGH (ref 0.0–0.5)
Eosinophils Relative: 9 %
HCT: 47.1 % — ABNORMAL HIGH (ref 36.0–46.0)
Hemoglobin: 15.3 g/dL — ABNORMAL HIGH (ref 12.0–15.0)
Immature Granulocytes: 0 %
Lymphocytes Relative: 29 %
Lymphs Abs: 1.9 10*3/uL (ref 0.7–4.0)
MCH: 29.3 pg (ref 26.0–34.0)
MCHC: 32.5 g/dL (ref 30.0–36.0)
MCV: 90.1 fL (ref 80.0–100.0)
Monocytes Absolute: 0.4 10*3/uL (ref 0.1–1.0)
Monocytes Relative: 7 %
Neutro Abs: 3.6 10*3/uL (ref 1.7–7.7)
Neutrophils Relative %: 54 %
Platelets: 299 10*3/uL (ref 150–400)
RBC: 5.23 MIL/uL — ABNORMAL HIGH (ref 3.87–5.11)
RDW: 12.8 % (ref 11.5–15.5)
WBC: 6.6 10*3/uL (ref 4.0–10.5)
nRBC: 0 % (ref 0.0–0.2)

## 2021-08-16 LAB — BASIC METABOLIC PANEL
Anion gap: 8 (ref 5–15)
BUN: 14 mg/dL (ref 6–20)
CO2: 25 mmol/L (ref 22–32)
Calcium: 9 mg/dL (ref 8.9–10.3)
Chloride: 102 mmol/L (ref 98–111)
Creatinine, Ser: 0.69 mg/dL (ref 0.44–1.00)
GFR, Estimated: >60 mL/min (ref >60)
Glucose, Bld: 109 mg/dL — ABNORMAL HIGH (ref 70–99)
Potassium: 3.7 mmol/L (ref 3.5–5.1)
Sodium: 135 mmol/L (ref 135–145)

## 2021-08-16 LAB — PROTIME-INR
INR: 1 (ref 0.8–1.2)
Prothrombin Time: 13.1 seconds (ref 11.4–15.2)

## 2021-08-17 ENCOUNTER — Ambulatory Visit: Payer: Commercial Managed Care - PPO

## 2021-08-18 ENCOUNTER — Ambulatory Visit: Payer: Commercial Managed Care - PPO

## 2021-08-18 ENCOUNTER — Encounter: Payer: Commercial Managed Care - PPO | Admitting: Occupational Therapy

## 2021-08-18 LAB — CARDIOLIPIN ANTIBODIES, IGG, IGM, IGA
Anticardiolipin IgA: <9 APL U/mL (ref 0–11)
Anticardiolipin IgG: <9 GPL U/mL (ref 0–14)
Anticardiolipin IgM: 28 MPL U/mL — ABNORMAL HIGH (ref 0–12)

## 2021-08-23 ENCOUNTER — Inpatient Hospital Stay: Payer: Commercial Managed Care - PPO | Attending: Oncology | Admitting: Oncology

## 2021-08-23 ENCOUNTER — Ambulatory Visit: Payer: Commercial Managed Care - PPO | Admitting: Physical Therapy

## 2021-08-23 ENCOUNTER — Encounter: Payer: Self-pay | Admitting: Oncology

## 2021-08-23 ENCOUNTER — Ambulatory Visit: Payer: Commercial Managed Care - PPO

## 2021-08-23 ENCOUNTER — Encounter: Payer: Commercial Managed Care - PPO | Admitting: Occupational Therapy

## 2021-08-23 ENCOUNTER — Other Ambulatory Visit: Payer: Self-pay

## 2021-08-23 VITALS — BP 138/82 | HR 74 | Temp 99.0°F | Resp 20 | Wt 275.2 lb

## 2021-08-23 DIAGNOSIS — R760 Raised antibody titer: Secondary | ICD-10-CM

## 2021-08-23 DIAGNOSIS — D6861 Antiphospholipid syndrome: Secondary | ICD-10-CM | POA: Insufficient documentation

## 2021-08-23 DIAGNOSIS — Z7901 Long term (current) use of anticoagulants: Secondary | ICD-10-CM | POA: Diagnosis not present

## 2021-08-23 DIAGNOSIS — Z8673 Personal history of transient ischemic attack (TIA), and cerebral infarction without residual deficits: Secondary | ICD-10-CM | POA: Insufficient documentation

## 2021-08-25 ENCOUNTER — Ambulatory Visit: Payer: Commercial Managed Care - PPO

## 2021-08-25 ENCOUNTER — Telehealth: Payer: Self-pay

## 2021-08-25 ENCOUNTER — Encounter: Payer: Commercial Managed Care - PPO | Admitting: Occupational Therapy

## 2021-08-25 NOTE — Progress Notes (Signed)
Hematology/Oncology Consult note [facility]  [phone] [phone]  Patient Care Team: Tracie Harrier, MD as PCP - General (Internal Medicine) Sindy Guadeloupe, MD as Consulting Physician (Hematology and Oncology) Schermerhorn, Gwen Her, MD as Consulting Physician (Obstetrics and Gynecology) Wonda Cerise, PA-C as Referring Physician (Physician Assistant)   Name of the patient: Tanya Henderson  [id number]  1976/04/27   Date of visit: 08/25/21  Diagnosis-review of ischemic stroke and cardiolipin antibody positive  Chief complaint/ Reason for visit-discussed results of blood work  Heme/Onc history: patient is a 46 year old female who was admitted to [facility] with symptoms ofRight-sided weakness.  Her past medical history significant for hypertension hyperlipidemia, morbid obesity, anxiety depression GERD.  CT was concerning for early subacute stroke MRI of the head and neck showed area of early subacute ischemia in the left caudate tail.  As a part of her stroke work-up she had a hypercoagulable panel done which showed normal protein C, protein S, Antithrombin III levels.  No evidence of factor V Leiden and prothrombin gene mutation.  Antiphospholipid antibody syndrome panel did not show any evidence of lupus anticoagulant and beta-2 glycoprotein levels were normal.  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  Patient has been referred to [place] for the same.  She has also been seen by outpatient neurology.   Patient was started on Eliquis over Coumadin as per patient preference when 1 set of IgM Anticardiolipin antibody titer was positive at 45.  Patient also notes that she has abnormal LFTs and follows up with GI and will be undergoing liver biopsy soon to rule out autoimmune hepatitis  Interval history-tolerating Eliquis well without any significant side effects  ECOG PS- 0 Pain scale- 0  Review of systems-  Review of Systems  Constitutional:  Negative for chills, fever, malaise/fatigue and weight loss.  HENT:  Negative for congestion, ear discharge and nosebleeds.   Eyes:  Negative for blurred vision.  Respiratory:  Negative for cough, hemoptysis, sputum production, shortness of breath and wheezing.   Cardiovascular:  Negative for chest pain, palpitations, orthopnea and claudication.  Gastrointestinal:  Negative for abdominal pain, blood in stool, constipation, diarrhea, heartburn, melena, nausea and vomiting.  Genitourinary:  Negative for dysuria, flank pain, frequency, hematuria and urgency.  Musculoskeletal:  Negative for back pain, joint pain and myalgias.  Skin:  Negative for rash.  Neurological:  Negative for dizziness, tingling, focal weakness, seizures, weakness and headaches.  Endo/Heme/Allergies:  Does not bruise/bleed easily.  Psychiatric/Behavioral:  Negative for depression and suicidal ideas. The patient does not have insomnia.       Allergies  Allergen Reactions   Clindamycin/Lincomycin    Penicillins Other (See Comments)    Reaction:  Unknown      Past Medical History:  Diagnosis Date   Abnormal LFTs    Anxiety    B12 deficiency    Bronchitis    Cholelithiasis 12/16/2013   Chronic kidney disease    Kidney stones   Depression 12/16/2013   GERD (gastroesophageal reflux disease) 12/16/2013   Headache    Hidradenitis    History of smoking    History of stroke    History of stroke 05/23/2021   Migraine 12/16/2013   Renal calculi    Vitamin D deficiency      Past Surgical History:  Procedure Laterality Date   CHOLECYSTECTOMY     CYSTOSCOPY W/ URETERAL STENT PLACEMENT Right 09/21/2015   Procedure: CYSTOSCOPY WITH STENT REPLACEMENT;  Surgeon: Hollice Espy, MD;  Location: [facility] ORS;  Service: Urology;  Laterality: Right;   CYSTOSCOPY W/ URETERAL STENT REMOVAL Right 02/10/2015   Procedure: CYSTOSCOPY WITH STENT REMOVAL;  Surgeon: Collier Flowers, MD;  Location: [facility]  ORS;  Service: Urology;  Laterality: Right;   CYSTOSCOPY WITH STENT PLACEMENT Right 01/29/2015   Procedure: CYSTOSCOPY WITH STENT PLACEMENT;  Surgeon: Ardis Hughs, MD;  Location: [facility] ORS;  Service: Urology;  Laterality: Right;   CYSTOSCOPY WITH STENT PLACEMENT Right 02/10/2015   Procedure: CYSTOSCOPY WITH STENT PLACEMENT;  Surgeon: Collier Flowers, MD;  Location: [facility] ORS;  Service: Urology;  Laterality: Right;   CYSTOSCOPY WITH STENT PLACEMENT Right 09/07/2015   Procedure: CYSTOSCOPY WITH STENT PLACEMENT;  Surgeon: Ardis Hughs, MD;  Location: [facility] ORS;  Service: Urology;  Laterality: Right;   CYSTOSCOPY/RETROGRADE/URETEROSCOPY/STONE EXTRACTION WITH BASKET  05/26/2015   Procedure: CYSTOSCOPY/RETROGRADE/URETEROSCOPY/STONE EXTRACTION WITH BASKET;  Surgeon: Hollice Espy, MD;  Location: [facility] ORS;  Service: Urology;;   CYSTOSCOPY/URETEROSCOPY/HOLMIUM LASER  05/26/2015   Procedure: CYSTOSCOPY/URETEROSCOPY/HOLMIUM LASER;  Surgeon: Hollice Espy, MD;  Location: [facility] ORS;  Service: Urology;;   EXTRACORPOREAL SHOCK WAVE LITHOTRIPSY Right 04/22/2015   Procedure: EXTRACORPOREAL SHOCK WAVE LITHOTRIPSY (ESWL);  Surgeon: Nickie Retort, MD;  Location: [facility] ORS;  Service: Urology;  Laterality: Right;   KIDNEY STONE SURGERY     OOPHORECTOMY Right 1996   Ovarian cyst   URETEROSCOPY WITH HOLMIUM LASER LITHOTRIPSY Right 02/10/2015   Procedure: URETEROSCOPY WITH HOLMIUM LASER LITHOTRIPSY;  Surgeon: Collier Flowers, MD;  Location: [facility] ORS;  Service: Urology;  Laterality: Right;   URETEROSCOPY WITH HOLMIUM LASER LITHOTRIPSY Right 09/21/2015   Procedure: URETEROSCOPY WITH HOLMIUM LASER LITHOTRIPSY/ fragmentation and removal;  Surgeon: Hollice Espy, MD;  Location: [facility] ORS;  Service: Urology;  Laterality: Right;    Social History   Socioeconomic History   Marital status: Divorced    Spouse name: Not on file   Number of children: Not on file   Years of education: Not on file   Highest education  level: Not on file  Occupational History   Not on file  Tobacco Use   Smoking status: Former    Packs/day: 1.00    Years: 15.00    Pack years: 15.00    Types: Cigarettes    Quit date: 06/25/2019    Years since quitting: 2.1    Passive exposure: Never   Smokeless tobacco: Never  Vaping Use   Vaping Use: Never used  Substance and Sexual Activity   Alcohol use: Yes    Comment: rare   Drug use: No   Sexual activity: Yes  Other Topics Concern   Not on file  Social History Narrative   CNA at [employer]   One son- 89 yo.   Social Determinants of Health   Financial Resource Strain: Not on file  Food Insecurity: Not on file  Transportation Needs: Not on file  Physical Activity: Not on file  Stress: Not on file  Social Connections: Not on file  Intimate Partner Violence: Not on file    Family History  Problem Relation Age of Onset   Lupus Mother    Hypertension Mother    Uterine cancer Mother    Diabetes Paternal Grandmother    Nephrolithiasis Paternal Grandfather    Diabetes Maternal Aunt    Breast cancer Neg Hx      Current Outpatient Medications:    cetirizine (ZYRTEC) 10 MG tablet, Take 10 mg by mouth daily., Disp: , Rfl:  ELIQUIS 5 MG TABS tablet, TAKE 2 TABLETS(10 MG) BY MOUTH TWICE DAILY FOR 7 DAYS THEN TAKE 1 TABLET(5 MG) BY MOUTH TWICE DAILY, Disp: 84 tablet, Rfl: 5   losartan (COZAAR) 25 MG tablet, Take 1 tablet (25 mg total) by mouth daily. Skip dose if systolic BP less than [id number] mmHg, Disp: 30 tablet, Rfl: 2   aspirin EC 81 MG EC tablet, Take 1 tablet (81 mg total) by mouth daily. Swallow whole. (Patient not taking: Reported on 08/23/2021), Disp: 30 tablet, Rfl: 11   atorvastatin (LIPITOR) 40 MG tablet, Take 1 tablet (40 mg total) by mouth every evening. (Patient not taking: Reported on 06/28/2021), Disp: 30 tablet, Rfl: 2   azithromycin (ZITHROMAX) 250 MG tablet, Take 250 mg by mouth as directed. (Patient not taking: Reported on 08/23/2021), Disp: , Rfl:    BIOTIN  PO, Take by mouth daily. (Patient not taking: Reported on 08/23/2021), Disp: , Rfl:    clobetasol ointment (TEMOVATE) [id number] %, Apply 1 application topically 2 (two) times daily. (Patient not taking: Reported on 06/28/2021), Disp: , Rfl:    escitalopram (LEXAPRO) 10 MG tablet, Take 10 mg by mouth daily. (Patient not taking: Reported on 06/28/2021), Disp: , Rfl:    losartan (COZAAR) 50 MG tablet, Take 50 mg by mouth daily. (Patient not taking: Reported on 08/23/2021), Disp: , Rfl:    metoprolol succinate (TOPROL-XL) 25 MG 24 hr tablet, Take 25 mg by mouth daily. (Patient not taking: Reported on 08/23/2021), Disp: , Rfl:    Multiple Vitamin (MULTI-VITAMIN) tablet, Take 1 tablet by mouth daily. (Patient not taking: Reported on 08/23/2021), Disp: , Rfl:    omeprazole (PRILOSEC) 20 MG capsule, Take 1 capsule by mouth daily. (Patient not taking: Reported on 08/23/2021), Disp: , Rfl:    ondansetron (ZOFRAN) 4 MG tablet, Take 4 mg by mouth every 8 (eight) hours as needed. (Patient not taking: Reported on 08/23/2021), Disp: , Rfl:    Probiotic Product (ADVANCED PROBIOTIC-14) CAPS, Take 1 capsule by mouth daily. (Patient not taking: Reported on 08/23/2021), Disp: , Rfl:    valACYclovir (VALTREX) 1000 MG tablet, SMARTSIG:2 Tablet(s) By Mouth Every 12 Hours (Patient not taking: Reported on 08/23/2021), Disp: , Rfl:    VITAMIN D, CHOLECALCIFEROL, PO, Take by mouth daily. (Patient not taking: Reported on 08/23/2021), Disp: , Rfl:   Physical exam:  Vitals:   08/23/21 1016  BP: 138/82  Pulse: 74  Resp: 20  Temp: 99 F (37.2 C)  SpO2: 96%  Weight: 275 lb 3.2 oz (124.8 kg)   Physical Exam Constitutional:      General: She is not in acute distress. Cardiovascular:     Rate and Rhythm: Normal rate and regular rhythm.     Heart sounds: Normal heart sounds.  Pulmonary:     Effort: Pulmonary effort is normal.     Breath sounds: Normal breath sounds.  Abdominal:     General: Bowel sounds are normal.     Palpations: Abdomen is  soft.  Skin:    General: Skin is warm and dry.  Neurological:     Mental Status: She is alert and oriented to person, place, and time.     CMP Latest Ref Rng & Units 08/16/2021  Glucose 70 - 99 mg/dL 109(H)  BUN 6 - 20 mg/dL 14  Creatinine 0.44 - 1.00 mg/dL 0.69  Sodium 135 - 145 mmol/L 135  Potassium 3.5 - 5.1 mmol/L 3.7  Chloride 98 - 111 mmol/L 102  CO2 22 - 32 mmol/L  25  Calcium 8.9 - 10.3 mg/dL 9.0  Total Protein 6.5 - 8.1 g/dL -  Total Bilirubin 0.3 - 1.2 mg/dL -  Alkaline Phos 38 - 126 U/L -  AST 15 - 41 U/L -  ALT 0 - 44 U/L -   CBC Latest Ref Rng & Units 08/16/2021  WBC 4.0 - 10.5 K/uL 6.6  Hemoglobin 12.0 - 15.0 g/dL 15.3(H)  Hematocrit 36.0 - 46.0 % 47.1(H)  Platelets 150 - 400 K/uL 299   Assessment and plan- Patient is a 46 y.o. female with history of ischemic stroke and anticardiolipin antibody titer positive here to discuss results of blood work  The patient had a mildly elevated IgM anticardiolipinAntibody titer 3 months ago at 54.  However her repeat titer is 28 which is considered low medium positive by our lab cutoff and not in the greater than 99 percentile range.  Given that she has not had a sustained greater than 99 percentile elevated IgM titer of greater than 40 she does not meet criteria for antiphospholipid antibody syndrome.    Moreover I did speak to her outpatient neurologist Luella Cook who confirmed that her stroke was ischemic and not thrombotic.  For these reasons I am not convinced that patient has antiphospholipid antibody syndrome.    Patient is also in the process of getting her liver biopsy done to rule out autoimmune hepatitis.  Her ANA panel was positive.  Patient to have autoimmune conditions can often have nonspecific elevation of antiphospholipid titers which may be her case as well.  For all these reasons I would like her to stop Eliquis at this time.  Her neurologist would want her to go back on low-dose aspirin.  We will reach out with the  patient with these recommendations.    Patient does not require a follow-up with me at this time unless she develops any future episodes of thrombosis.   Visit Diagnosis 1. History of ischemic stroke   2. Anti-cardiolipin antibody positive      Dr. Randa Evens, MD, MPH [facility] [id number] 08/25/2021 8:32 AM

## 2021-08-25 NOTE — Telephone Encounter (Signed)
Pt is aware and understands of new med change; stop eliquis and go back to low dose aspirin per Dr. Janese Banks ?

## 2021-08-26 ENCOUNTER — Ambulatory Visit: Payer: Commercial Managed Care - PPO | Admitting: Physical Therapy

## 2021-08-30 ENCOUNTER — Ambulatory Visit: Payer: Commercial Managed Care - PPO

## 2021-08-30 ENCOUNTER — Ambulatory Visit: Payer: Commercial Managed Care - PPO | Admitting: Physical Therapy

## 2021-09-01 ENCOUNTER — Ambulatory Visit: Payer: Commercial Managed Care - PPO

## 2021-09-01 ENCOUNTER — Encounter: Payer: Commercial Managed Care - PPO | Admitting: Occupational Therapy

## 2021-09-02 ENCOUNTER — Ambulatory Visit: Payer: Commercial Managed Care - PPO | Admitting: Physical Therapy

## 2021-09-05 ENCOUNTER — Ambulatory Visit: Payer: Commercial Managed Care - PPO | Admitting: Dietician

## 2021-09-06 ENCOUNTER — Ambulatory Visit: Payer: Commercial Managed Care - PPO | Admitting: Physical Therapy

## 2021-09-06 ENCOUNTER — Ambulatory Visit: Payer: Commercial Managed Care - PPO

## 2021-09-08 ENCOUNTER — Ambulatory Visit: Payer: Commercial Managed Care - PPO

## 2021-09-09 ENCOUNTER — Ambulatory Visit: Payer: Commercial Managed Care - PPO | Admitting: Dietician

## 2021-09-12 ENCOUNTER — Other Ambulatory Visit: Payer: Self-pay

## 2021-09-12 ENCOUNTER — Ambulatory Visit
Admission: RE | Admit: 2021-09-12 | Discharge: 2021-09-12 | Disposition: A | Discharge status: Home / Self Care | Payer: Commercial Managed Care - PPO | Source: Ambulatory Visit | Attending: Internal Medicine | Admitting: Internal Medicine

## 2021-09-12 DIAGNOSIS — Z1231 Encounter for screening mammogram for malignant neoplasm of breast: Secondary | ICD-10-CM | POA: Diagnosis not present

## 2021-10-03 HISTORY — PX: LIVER BIOPSY: SHX301

## 2021-10-08 ENCOUNTER — Emergency Department
Admission: EM | Admit: 2021-10-08 | Discharge: 2021-10-09 | Disposition: A | Discharge status: Home / Self Care | Payer: Commercial Managed Care - PPO | Attending: Emergency Medicine | Admitting: Emergency Medicine

## 2021-10-08 ENCOUNTER — Other Ambulatory Visit: Payer: Self-pay

## 2021-10-08 ENCOUNTER — Encounter: Payer: Self-pay | Admitting: Emergency Medicine

## 2021-10-08 ENCOUNTER — Emergency Department: Payer: Commercial Managed Care - PPO

## 2021-10-08 DIAGNOSIS — K5730 Diverticulosis of large intestine without perforation or abscess without bleeding: Secondary | ICD-10-CM

## 2021-10-08 DIAGNOSIS — K922 Gastrointestinal hemorrhage, unspecified: Secondary | ICD-10-CM

## 2021-10-08 DIAGNOSIS — K219 Gastro-esophageal reflux disease without esophagitis: Secondary | ICD-10-CM | POA: Insufficient documentation

## 2021-10-08 DIAGNOSIS — R1010 Upper abdominal pain, unspecified: Secondary | ICD-10-CM | POA: Diagnosis present

## 2021-10-08 DIAGNOSIS — R0789 Other chest pain: Secondary | ICD-10-CM

## 2021-10-08 LAB — CBC WITH DIFFERENTIAL/PLATELET
Abs Immature Granulocytes: 0.03 10*3/uL (ref 0.00–0.07)
Basophils Absolute: 0.1 10*3/uL (ref 0.0–0.1)
Basophils Relative: 1 %
Eosinophils Absolute: 0.2 10*3/uL (ref 0.0–0.5)
Eosinophils Relative: 2 %
HCT: 44.2 % (ref 36.0–46.0)
Hemoglobin: 13.8 g/dL (ref 12.0–15.0)
Immature Granulocytes: 0 %
Lymphocytes Relative: 13 %
Lymphs Abs: 1.4 10*3/uL (ref 0.7–4.0)
MCH: 27.9 pg (ref 26.0–34.0)
MCHC: 31.2 g/dL (ref 30.0–36.0)
MCV: 89.3 fL (ref 80.0–100.0)
Monocytes Absolute: 0.8 10*3/uL (ref 0.1–1.0)
Monocytes Relative: 8 %
Neutro Abs: 7.8 10*3/uL — ABNORMAL HIGH (ref 1.7–7.7)
Neutrophils Relative %: 76 %
Platelets: 300 10*3/uL (ref 150–400)
RBC: 4.95 MIL/uL (ref 3.87–5.11)
RDW: 13.3 % (ref 11.5–15.5)
WBC: 10.3 10*3/uL (ref 4.0–10.5)
nRBC: 0 % (ref 0.0–0.2)

## 2021-10-08 LAB — COMPREHENSIVE METABOLIC PANEL
ALT: 140 U/L — ABNORMAL HIGH (ref 0–44)
AST: 164 U/L — ABNORMAL HIGH (ref 15–41)
Albumin: 4 g/dL (ref 3.5–5.0)
Alkaline Phosphatase: 295 U/L — ABNORMAL HIGH (ref 38–126)
Anion gap: 11 (ref 5–15)
BUN: 16 mg/dL (ref 6–20)
CO2: 25 mmol/L (ref 22–32)
Calcium: 10.9 mg/dL — ABNORMAL HIGH (ref 8.9–10.3)
Chloride: 100 mmol/L (ref 98–111)
Creatinine, Ser: 0.73 mg/dL (ref 0.44–1.00)
GFR, Estimated: >60 mL/min (ref >60)
Glucose, Bld: 104 mg/dL — ABNORMAL HIGH (ref 70–99)
Potassium: 5.3 mmol/L — ABNORMAL HIGH (ref 3.5–5.1)
Sodium: 136 mmol/L (ref 135–145)
Total Bilirubin: 2.6 mg/dL — ABNORMAL HIGH (ref 0.3–1.2)
Total Protein: 8.2 g/dL — ABNORMAL HIGH (ref 6.5–8.1)

## 2021-10-08 LAB — LIPASE, BLOOD: Lipase: 41 U/L (ref 11–51)

## 2021-10-08 LAB — POC URINE PREG, ED: Preg Test, Ur: NEGATIVE

## 2021-10-08 LAB — URINALYSIS, ROUTINE W REFLEX MICROSCOPIC
Bilirubin Urine: NEGATIVE
Glucose, UA: NEGATIVE mg/dL
Hgb urine dipstick: NEGATIVE
Ketones, ur: NEGATIVE mg/dL
Leukocytes,Ua: NEGATIVE
Nitrite: NEGATIVE
Protein, ur: NEGATIVE mg/dL
Specific Gravity, Urine: 1.006 (ref 1.005–1.030)
pH: 7 (ref 5.0–8.0)

## 2021-10-08 LAB — TROPONIN I (HIGH SENSITIVITY): Troponin I (High Sensitivity): 4 ng/L (ref <18)

## 2021-10-08 MED ORDER — ALUM & MAG HYDROXIDE-SIMETH 200-200-20 MG/5ML PO SUSP
30.0000 mL | Freq: Once | ORAL | Status: AC
  Administered 2021-10-08: 30 mL via ORAL
  Filled 2021-10-08: qty 30

## 2021-10-08 MED ORDER — SUCRALFATE 1 G PO TABS: 1.0000 g | ORAL_TABLET | Freq: Four times a day (QID) | ORAL | 1 refills | Status: DC

## 2021-10-08 MED ORDER — LIDOCAINE VISCOUS HCL 2 % MT SOLN
15.0000 mL | Freq: Once | OROMUCOSAL | Status: AC
Start: 2021-10-08 — End: 2021-10-08
  Administered 2021-10-08: 15 mL via ORAL
  Filled 2021-10-08: qty 15

## 2021-10-08 MED ORDER — LIDOCAINE VISCOUS HCL 2 % MT SOLN: 15.0000 mL | Freq: Four times a day (QID) | OROMUCOSAL | 0 refills | Status: DC | PRN

## 2021-10-08 MED ORDER — LIDOCAINE VISCOUS HCL 2 % MT SOLN
15.0000 mL | Freq: Once | OROMUCOSAL | Status: AC
  Administered 2021-10-09: 15 mL via ORAL
  Filled 2021-10-08: qty 15

## 2021-10-08 MED ORDER — ALUM & MAG HYDROXIDE-SIMETH 200-200-20 MG/5ML PO SUSP
30.0000 mL | Freq: Once | ORAL | Status: AC
  Administered 2021-10-09: 30 mL via ORAL
  Filled 2021-10-08: qty 30

## 2021-10-08 MED ORDER — IOHEXOL 300 MG/ML  SOLN
100.0000 mL | Freq: Once | INTRAMUSCULAR | Status: AC | PRN
Start: 2021-10-08 — End: 2021-10-08
  Administered 2021-10-08: 125 mL via INTRAVENOUS

## 2021-10-08 NOTE — ED Notes (Signed)
Pt transported to Xray. 

## 2021-10-08 NOTE — Discharge Instructions (Signed)
Please seek medical attention for any high fevers, chest pain, shortness of breath, change in behavior, persistent vomiting, bloody stool or any other new or concerning symptoms.  

## 2021-10-08 NOTE — ED Provider Notes (Signed)
? ?[facility] ?Provider Note ? ? ? Event Date/Time  ? First MD Initiated Contact with Patient 10/08/21 2033   ?  (approximate) ? ? ?History  ? ?Back Pain, Chest Pain, and Rectal Bleeding ? ? ?HPI ? ?Tanya Henderson is a 46 y.o. female  who, per cardiology note dated 08/18/21 has history of CVA, GERD, cholelithiasis who presents to the emergency department today because of concern for abdominal, chest pain as well as GI bleed.  Patient states that this morning after eating a burger with red chili think she started having upper abdominal and chest pain.  She describes this as burning.  She does have history of heartburn.  The pain however has not gotten any better.  She states it is unusual for her pain to last this long.  Additionally the patient has noticed 2 episodes of bloody bowel movements.  She denies history of GI bleed.  She does state that 5 days ago she underwent endoscopy with liver and lymph node biopsy. ? ?Physical Exam  ? ?Triage Vital Signs: ?ED Triage Vitals  ?Enc Vitals Group  ?   BP 10/08/21 1953 (!) 159/99  ?   Pulse Rate 10/08/21 1953 95  ?   Resp 10/08/21 1953 18  ?   Temp 10/08/21 1953 99 ?F (37.2 ?C)  ?   Temp Source 10/08/21 1953 Oral  ?   SpO2 10/08/21 1953 100 %  ?   Weight 10/08/21 1953 270 lb (122.5 kg)  ?   Height 10/08/21 1953 5\' 4"  (1.626 m)  ?   Head Circumference --   ?   Peak Flow --   ?   Pain Score 10/08/21 1953 7  ? ?Most recent vital signs: ?Vitals:  ? 10/08/21 1953  ?BP: (!) 159/99  ?Pulse: 95  ?Resp: 18  ?Temp: 99 ?F (37.2 ?C)  ?SpO2: 100%  ? ? ?General: Awake, alert and oriented. ?CV:  Good peripheral perfusion. Regular rate and rhythm. ?Resp:  Normal effort. Lungs clear. ?Abd:  No distention. No CVA tenderness. No tenderness.  ? ? ?ED Results / Procedures / Treatments  ? ?Labs ?(all labs ordered are listed, but only abnormal results are displayed) ?Labs Reviewed  ?COMPREHENSIVE METABOLIC PANEL - Abnormal; Notable for the following components:  ?    Result  Value  ? Potassium 5.3 (*)   ? Glucose, Bld 104 (*)   ? Calcium 10.9 (*)   ? Total Protein 8.2 (*)   ? AST 164 (*)   ? ALT 140 (*)   ? Alkaline Phosphatase 295 (*)   ? Total Bilirubin 2.6 (*)   ? All other components within normal limits  ?URINALYSIS, ROUTINE W REFLEX MICROSCOPIC - Abnormal; Notable for the following components:  ? Color, Urine YELLOW (*)   ? APPearance CLEAR (*)   ? All other components within normal limits  ?CBC WITH DIFFERENTIAL/PLATELET - Abnormal; Notable for the following components:  ? Neutro Abs 7.8 (*)   ? All other components within normal limits  ?LIPASE, BLOOD  ?POC URINE PREG, ED  ?TROPONIN I (HIGH SENSITIVITY)  ? ? ? ?EKG ? ?I04/24/23, attending physician, personally viewed and interpreted this EKG ? ?EKG Time: 1956 ?Rate: 87 ?Rhythm: normal sinus rhythm ?Axis: normal ?Intervals: qtc 418 ?QRS: narrow ?ST changes: no st elevation ?Impression: normal ekg ? ? ? ?RADIOLOGY ?I independently interpreted and visualized the CXR. My interpretation: No pneumonia. No pneumothorax. ?Radiology interpretation:  ?IMPRESSION:  ?No active cardiopulmonary disease. Chronic  appearing bronchitic  ?changes  ? ?CT abd/pel ?IMPRESSION:  ?1. Mild periportal edema.  ?2. Couple scattered colonic diverticulosis with no acute  ?diverticulitis.  ?3. Nonobstructive punctate left nephrolithiasis.  ?4. Please note gastrointestinal bleed cannot be evaluated for on  ?this single phase portal venous study.  ? ? ? ?PROCEDURES: ? ?Critical Care performed: No ? ?Procedures ? ? ?MEDICATIONS ORDERED IN ED: ?Medications - No data to display ? ? ?IMPRESSION / MDM / ASSESSMENT AND PLAN / ED COURSE  ?I reviewed the triage vital signs and the nursing notes. ?             ?               ? ?Differential diagnosis includes, but is not limited to, gastritis, pancreatitis, ACS, hemorrhoids, AVM, diverticular disease. ? ?Patient presents to the emergency department today with concerns for chest and abdominal pain.  Also  concerns for GI bleed.  Patient's work-up here with no significant anemia.  Does show elevations with her of her LFTs which is consistent with known liver disease.  Lipase and troponin were negative.  I did obtain a CT scan given concern for source of potential GI bleed and abdominal pain.  CT was concerning for possible diverticular disease.  Patient without any further bloody bowel movements here in the emergency department.  At this time given lack of any further bloody bowel movements and patient did feel improvement after GI cocktail in terms of her pain I think it is reasonable for patient be discharged home and I do not feel she necessitates inpatient admission at this time.  I did discuss with patient importance of following up with her GI doctor. ? ? ?FINAL CLINICAL IMPRESSION(S) / ED DIAGNOSES  ? ?Final diagnoses:  ?Atypical chest pain  ?Gastroesophageal reflux disease, unspecified whether esophagitis present  ?Lower GI bleed  ?Diverticula of intestine  ? ? ? ? ?Note:  This document was prepared using Dragon voice recognition software and may include unintentional dictation errors. ? ?  ?Phineas Semen, MD ?10/09/21 0005 ? ?

## 2021-10-08 NOTE — ED Notes (Signed)
Per Gregary Signs, RN, provider cancelled the second troponin.  ?

## 2021-10-08 NOTE — ED Triage Notes (Signed)
Pt to ED via POV, reports "really bad indigestion earlier today". Pt c/o burning in back, chest, and pain when taking a deep breath. Pt also c/o increasing fatigue. Pt states earlier today had BM with large amounts of "burgundy blood" noted in stool. Pt states had liver and lymph node biopsy at [facility] on Monday. 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 Pt states notes clear urine with urination.  ?

## 2021-10-09 NOTE — ED Notes (Signed)
After a 30 min conversation regarding her diagnosis that she just had with provider, patient continued to ask this nurse questions. Discharge instructions were provided to patient along with info regarding scripts and follow-up appt. Patient's mother also asked questions all of which were answered. Patient then stated she needed to use the bathroom in the room prior to leaving. ?

## 2021-10-13 ENCOUNTER — Encounter: Payer: Self-pay | Admitting: Dietician

## 2021-10-13 NOTE — Progress Notes (Signed)
Patient has cancelled her third consecutively scheduled appointment (for 10/14/21). Sent notification to referring provider. ?

## 2021-10-14 ENCOUNTER — Ambulatory Visit: Payer: Commercial Managed Care - PPO | Admitting: Dietician

## 2021-12-15 ENCOUNTER — Encounter (HOSPITAL_COMMUNITY): Payer: Commercial Managed Care - PPO | Admitting: Hematology

## 2021-12-23 ENCOUNTER — Encounter (HOSPITAL_COMMUNITY): Payer: Commercial Managed Care - PPO | Admitting: Hematology

## 2022-01-06 ENCOUNTER — Encounter (HOSPITAL_COMMUNITY): Payer: Self-pay | Admitting: Hematology

## 2022-01-06 ENCOUNTER — Inpatient Hospital Stay (HOSPITAL_COMMUNITY): Payer: Commercial Managed Care - PPO | Attending: Hematology | Admitting: Hematology

## 2022-01-06 DIAGNOSIS — Z808 Family history of malignant neoplasm of other organs or systems: Secondary | ICD-10-CM | POA: Insufficient documentation

## 2022-01-06 DIAGNOSIS — Z87891 Personal history of nicotine dependence: Secondary | ICD-10-CM | POA: Insufficient documentation

## 2022-01-06 DIAGNOSIS — D500 Iron deficiency anemia secondary to blood loss (chronic): Secondary | ICD-10-CM | POA: Diagnosis not present

## 2022-01-06 DIAGNOSIS — N921 Excessive and frequent menstruation with irregular cycle: Secondary | ICD-10-CM | POA: Insufficient documentation

## 2022-01-06 DIAGNOSIS — D509 Iron deficiency anemia, unspecified: Secondary | ICD-10-CM | POA: Insufficient documentation

## 2022-01-06 NOTE — Patient Instructions (Addendum)
[facility] at [facility] Discharge Instructions  You were seen and examined today by Dr. Ellin Saba. Dr. Ellin Saba is a hematologist, meaning that he specializes in blood abnormalities. Dr. Ellin Saba discussed your past medical history, family history of cancers/blood conditions and the events that led to you being here today.  You were referred to Dr. Ellin Saba due to iron deficiency anemia. You received 1 gram of iron in June. Dr. Ellin Saba would like for you to have an additional gram of iron because of your ongoing anemia as well as fatigue. Your hemoglobin improved with the last iron infusions but the iron levels remained low.  There has been no identifiable features that place you at an increased risk for developing blood clots according to the most recent lab results. Dr. Ellin Saba is not concerned about the IGG4 being related to the iron deficiency anemia.  Follow-up as scheduled for iron infusions. You will see Dr. Ellin Saba again in about 3 months, at that time Dr. Ellin Saba will also check for vitamin deficiencies that could be contributing to your anemia.  Thank you for choosing  [facility] to provide your oncology and hematology care.  To afford each patient quality time with our provider, please arrive at least 15 minutes before your scheduled appointment time.   If you have a lab appointment with the Cancer Center please come in thru the Main Entrance and check in at the main information desk.  You need to re-schedule your appointment should you arrive 10 or more minutes late.  We strive to give you quality time with our providers, and arriving late affects you and other patients whose appointments are after yours.  Also, if you no show three or more times for appointments you may be dismissed from the clinic at the providers discretion.     Again, thank you for choosing [facility].  Our hope is that  these requests will decrease the amount of time that you wait before being seen by our physicians.       _____________________________________________________________  Should you have questions after your visit to [facility], please contact our office at [phone] and follow the prompts.  Our office hours are 8:00 a.m. and 4:30 p.m. Monday - Friday.  Please note that voicemails left after 4:00 p.m. may not be returned until the following business day.  We are closed weekends and major holidays.  You do have access to a nurse 24-7, just call the main number to the clinic [phone] and do not press any options, hold on the line and a nurse will answer the phone.    For prescription refill requests, have your pharmacy contact our office and allow 72 hours.

## 2022-01-06 NOTE — Progress Notes (Signed)
[facility] [address], [place] [address]   CLINIC:  Medical Oncology/Hematology  Patient Care Team: Tracie Harrier, MD as PCP - General (Internal Medicine) Sindy Guadeloupe, MD as Consulting Physician (Hematology and Oncology) Schermerhorn, Gwen Her, MD as Consulting Physician (Obstetrics and Gynecology) Wonda Cerise, PA-C as Referring Physician (Physician Assistant) Derek Jack, MD as Medical Oncologist (Hematology)  CHIEF COMPLAINTS/PURPOSE OF CONSULTATION:  Evaluation for IDA  HISTORY OF PRESENTING ILLNESS:  Tanya Henderson 46 y.o. female is here because of evaluation for IDA, at the request of Dr. Manson Allan.  Today she reports feeling well. She reports fatigue. She ha received 1 previous iron infusion in June 2023, and she started taking an iron tablet daily starting in May 2023. She reports tolerating the iron tablets well aside from dark loose stools, and she also reports tolerating the iron infusion well with improved energy. She denies ice pica, hematochezia, and black stools. She has never previously had a colonoscopy. She had a previous iron infusion in May 2023 while hospitalized. She was taking blood thinner following a CVA in December 2022, but this blood thinner was stopped following her hospitalization in May. Her right arm continues to be sightly weaker than her left hand following this episode of CVA She reports her breathing is at her baseline. Her menses are intermittently heavy and moderate, but she reports her menses are lighter than they have been previously. She reports wrist and knee pains, and she reports ankle swellings accompanied by pain on top of her foot.   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 She quit smoking 3 years ago after smoking 1 ppd for 15 years. Her maternal grandmother had a PE and CVA. Her mother had uterine cancer, and she otherwise denies family history of cancer.   MEDICAL HISTORY:  Past Medical History:   Diagnosis Date   Abnormal LFTs    Anxiety    B12 deficiency    Bronchitis    Cholelithiasis 12/16/2013   Chronic kidney disease    Kidney stones   Depression 12/16/2013   GERD (gastroesophageal reflux disease) 12/16/2013   Headache    Hidradenitis    History of smoking    History of stroke    History of stroke 05/23/2021   Migraine 12/16/2013   Renal calculi    Vitamin D deficiency     SURGICAL HISTORY: Past Surgical History:  Procedure Laterality Date   CHOLECYSTECTOMY     CYSTOSCOPY W/ URETERAL STENT PLACEMENT Right 09/21/2015   Procedure: CYSTOSCOPY WITH STENT REPLACEMENT;  Surgeon: Hollice Espy, MD;  Location: [facility];  Service: Urology;  Laterality: Right;   CYSTOSCOPY W/ URETERAL STENT REMOVAL Right 02/10/2015   Procedure: CYSTOSCOPY WITH STENT REMOVAL;  Surgeon: Collier Flowers, MD;  Location: [facility];  Service: Urology;  Laterality: Right;   CYSTOSCOPY WITH STENT PLACEMENT Right 01/29/2015   Procedure: CYSTOSCOPY WITH STENT PLACEMENT;  Surgeon: Ardis Hughs, MD;  Location: [facility];  Service: Urology;  Laterality: Right;   CYSTOSCOPY WITH STENT PLACEMENT Right 02/10/2015   Procedure: CYSTOSCOPY WITH STENT PLACEMENT;  Surgeon: Collier Flowers, MD;  Location: [facility];  Service: Urology;  Laterality: Right;   CYSTOSCOPY WITH STENT PLACEMENT Right 09/07/2015   Procedure: CYSTOSCOPY WITH STENT PLACEMENT;  Surgeon: Ardis Hughs, MD;  Location: [facility];  Service: Urology;  Laterality: Right;   CYSTOSCOPY/RETROGRADE/URETEROSCOPY/STONE EXTRACTION WITH BASKET  05/26/2015   Procedure: CYSTOSCOPY/RETROGRADE/URETEROSCOPY/STONE EXTRACTION WITH BASKET;  Surgeon: Hollice Espy,  MD;  Location: [facility];  Service: Urology;;   CYSTOSCOPY/URETEROSCOPY/HOLMIUM LASER  05/26/2015   Procedure: CYSTOSCOPY/URETEROSCOPY/HOLMIUM LASER;  Surgeon: Vanna Scotland, MD;  Location: [facility];  Service: Urology;;   EXTRACORPOREAL SHOCK WAVE LITHOTRIPSY Right 04/22/2015   Procedure:  EXTRACORPOREAL SHOCK WAVE LITHOTRIPSY (ESWL);  Surgeon: Hildred Laser, MD;  Location: [facility];  Service: Urology;  Laterality: Right;   KIDNEY STONE SURGERY     LIVER BIOPSY  10/03/2021   OOPHORECTOMY Right 1996   Ovarian cyst   URETEROSCOPY WITH HOLMIUM LASER LITHOTRIPSY Right 02/10/2015   Procedure: URETEROSCOPY WITH HOLMIUM LASER LITHOTRIPSY;  Surgeon: Lorraine Lax, MD;  Location: [facility];  Service: Urology;  Laterality: Right;   URETEROSCOPY WITH HOLMIUM LASER LITHOTRIPSY Right 09/21/2015   Procedure: URETEROSCOPY WITH HOLMIUM LASER LITHOTRIPSY/ fragmentation and removal;  Surgeon: Vanna Scotland, MD;  Location: [facility];  Service: Urology;  Laterality: Right;    SOCIAL HISTORY: Social History   Socioeconomic History   Marital status: Divorced    Spouse name: Not on file   Number of children: Not on file   Years of education: Not on file   Highest education level: Not on file  Occupational History   Not on file  Tobacco Use   Smoking status: Former    Packs/day: 1.00    Years: 15.00    Total pack years: 15.00    Types: Cigarettes    Quit date: 06/25/2019    Years since quitting: 2.5    Passive exposure: Never   Smokeless tobacco: Never  Vaping Use   Vaping Use: Never used  Substance and Sexual Activity   Alcohol use: Yes    Comment: rare   Drug use: No   Sexual activity: Yes  Other Topics Concern   Not on file  Social History Narrative   CNA at [employer]   One son- 24 yo.   Social Determinants of Health   Financial Resource Strain: Not on file  Food Insecurity: Not on file  Transportation Needs: Not on file  Physical Activity: Not on file  Stress: Not on file  Social Connections: Not on file  Intimate Partner Violence: Not on file    FAMILY HISTORY: Family History  Problem Relation Age of Onset   Lupus Mother    Hypertension Mother    Uterine cancer Mother    Diabetes Paternal Grandmother    Nephrolithiasis Paternal Grandfather    Diabetes  Maternal Aunt    Breast cancer Neg Hx     ALLERGIES:  is allergic to clindamycin/lincomycin and penicillins.  MEDICATIONS:  Current Outpatient Medications  Medication Sig Dispense Refill   BIOTIN PO Take by mouth daily.     cetirizine (ZYRTEC) 10 MG tablet Take 10 mg by mouth daily.     clobetasol ointment (TEMOVATE) 0.05 % Apply 1 application  topically 2 (two) times daily.     iron polysaccharides (NIFEREX) 150 MG capsule Take 150 mg by mouth daily.     losartan (COZAAR) 25 MG tablet Take 1 tablet (25 mg total) by mouth daily. Skip dose if systolic BP less than 130 mmHg 30 tablet 2   Multiple Vitamin (MULTI-VITAMIN) tablet Take 1 tablet by mouth daily.     omeprazole (PRILOSEC) 20 MG capsule Take 1 capsule by mouth daily.     Probiotic Product (ADVANCED PROBIOTIC-14) CAPS Take 1 capsule by mouth daily.     ursodiol (ACTIGALL) 300 MG capsule Take 900 mg by mouth 2 (two) times daily. Take 3 capsule  twice daily     VITAMIN D, CHOLECALCIFEROL, PO Take by mouth daily.     ondansetron (ZOFRAN) 4 MG tablet Take 4 mg by mouth every 8 (eight) hours as needed. (Patient not taking: Reported on 08/23/2021)     No current facility-administered medications for this visit.    REVIEW OF SYSTEMS:   Review of Systems  Constitutional:  Positive for fatigue. Negative for appetite change.  Respiratory:  Negative for shortness of breath.   Cardiovascular:  Positive for leg swelling.  Gastrointestinal:  Negative for blood in stool. Diarrhea: loose stool. Genitourinary:  Negative for menstrual problem.   Musculoskeletal:  Positive for arthralgias (6/10 knees and wrist).  Neurological:  Positive for dizziness and numbness (feet).  All other systems reviewed and are negative.    PHYSICAL EXAMINATION: ECOG PERFORMANCE STATUS: 0 - Asymptomatic  Vitals:   01/06/22 0812  BP: 137/87  Pulse: 71  Resp: 20  Temp: (!) 97.5 F (36.4 C)  SpO2: 98%   Filed Weights   01/06/22 0812  Weight: 275 lb 9.2 oz  (125 kg)   Physical Exam Vitals reviewed.  Constitutional:      Appearance: Normal appearance. She is obese.  Cardiovascular:     Rate and Rhythm: Normal rate and regular rhythm.     Pulses: Normal pulses.     Heart sounds: Normal heart sounds.  Pulmonary:     Effort: Pulmonary effort is normal.     Breath sounds: Normal breath sounds.  Abdominal:     Palpations: Abdomen is soft. There is no hepatomegaly, splenomegaly or mass.     Tenderness: There is no abdominal tenderness.  Musculoskeletal:     Right lower leg: No edema.     Left lower leg: No edema.  Neurological:     General: No focal deficit present.     Mental Status: She is alert and oriented to person, place, and time.  Psychiatric:        Mood and Affect: Mood normal.        Behavior: Behavior normal.     LABORATORY DATA:  I have reviewed the data as listed Recent Results (from the past 2160 hour(s))  Lipase, blood     Status: None   Collection Time: 10/08/21  8:02 PM  Result Value Ref Range   Lipase 41 11 - 51 U/L    Comment: Performed at [facility], [address] [place], [place] [address]  Comprehensive metabolic panel     Status: Abnormal   Collection Time: 10/08/21  8:02 PM  Result Value Ref Range   Sodium 136 135 - 145 mmol/L   Potassium 5.3 (H) 3.5 - 5.1 mmol/L    Comment: HEMOLYSIS AT THIS LEVEL MAY AFFECT RESULT   Chloride 100 98 - 111 mmol/L   CO2 25 22 - 32 mmol/L   Glucose, Bld 104 (H) 70 - 99 mg/dL    Comment: Glucose reference range applies only to samples taken after fasting for at least 8 hours.   BUN 16 6 - 20 mg/dL   Creatinine, Ser 0.73 0.44 - 1.00 mg/dL   Calcium 10.9 (H) 8.9 - 10.3 mg/dL   Total Protein 8.2 (H) 6.5 - 8.1 g/dL   Albumin 4.0 3.5 - 5.0 g/dL   AST 164 (H) 15 - 41 U/L    Comment: HEMOLYSIS AT THIS LEVEL MAY AFFECT RESULT   ALT 140 (H) 0 - 44 U/L   Alkaline Phosphatase 295 (H) 38 - 126 U/L  Total Bilirubin 2.6 (H) 0.3 - 1.2 mg/dL    Comment: HEMOLYSIS AT  THIS LEVEL MAY AFFECT RESULT   GFR, Estimated >60 >60 mL/min    Comment: (NOTE) Calculated using the CKD-EPI Creatinine Equation (2021)    Anion gap 11 5 - 15    Comment: Performed at [facility], [address] [place], [place] [address]  Urinalysis, Routine w reflex microscopic Urine, Clean Catch     Status: Abnormal   Collection Time: 10/08/21  8:02 PM  Result Value Ref Range   Color, Urine YELLOW (A) YELLOW   APPearance CLEAR (A) CLEAR   Specific Gravity, Urine 1.006 1.005 - 1.030   pH 7.0 5.0 - 8.0   Glucose, UA NEGATIVE NEGATIVE mg/dL   Hgb urine dipstick NEGATIVE NEGATIVE   Bilirubin Urine NEGATIVE NEGATIVE   Ketones, ur NEGATIVE NEGATIVE mg/dL   Protein, ur NEGATIVE NEGATIVE mg/dL   Nitrite NEGATIVE NEGATIVE   Leukocytes,Ua NEGATIVE NEGATIVE    Comment: Performed at [facility], [address] [place], [place] [address]  CBC with Differential     Status: Abnormal   Collection Time: 10/08/21  8:02 PM  Result Value Ref Range   WBC 10.3 4.0 - 10.5 K/uL   RBC 4.95 3.87 - 5.11 MIL/uL   Hemoglobin 13.8 12.0 - 15.0 g/dL   HCT 82.4 23.5 - 36.1 %   MCV 89.3 80.0 - 100.0 fL   MCH 27.9 26.0 - 34.0 pg   MCHC 31.2 30.0 - 36.0 g/dL   RDW 44.3 15.4 - 00.8 %   Platelets 300 150 - 400 K/uL   nRBC 0.0 0.0 - 0.2 %   Neutrophils Relative % 76 %   Neutro Abs 7.8 (H) 1.7 - 7.7 K/uL   Lymphocytes Relative 13 %   Lymphs Abs 1.4 0.7 - 4.0 K/uL   Monocytes Relative 8 %   Monocytes Absolute 0.8 0.1 - 1.0 K/uL   Eosinophils Relative 2 %   Eosinophils Absolute 0.2 0.0 - 0.5 K/uL   Basophils Relative 1 %   Basophils Absolute 0.1 0.0 - 0.1 K/uL   Immature Granulocytes 0 %   Abs Immature Granulocytes 0.03 0.00 - 0.07 K/uL    Comment: Performed at [facility], [address] [place], [place] [address]  Troponin I (High Sensitivity)     Status: None   Collection Time: 10/08/21  8:02 PM  Result Value Ref Range   Troponin I (High Sensitivity) 4 <18 ng/L     Comment: (NOTE) Elevated high sensitivity troponin I (hsTnI) values and significant  changes across serial measurements may suggest ACS but many other  chronic and acute conditions are known to elevate hsTnI results.  Refer to the "Links" section for chest pain algorithms and additional  guidance. Performed at [facility], [address] [place], [place] [address]   POC urine preg, ED     Status: None   Collection Time: 10/08/21  8:17 PM  Result Value Ref Range   Preg Test, Ur NEGATIVE NEGATIVE    Comment:        THE SENSITIVITY OF THIS METHODOLOGY IS >24 mIU/mL     RADIOGRAPHIC STUDIES: I have personally reviewed the radiological images as listed and agreed with the findings in the report. No results found.  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 - She received INFeD on 11/17/2021 at [facility].  Subsequent hemoglobin improved but her ferritin was  low at 32. - She is on iron tablets daily since May 2023.  Denies any ice pica.  No BRBPR.  Never had colonoscopy.  She had blood transfusion in May 2023.  Menstrual bleeding is variable and occasionally heavy.   Social/family history: - Lives with her mother and son.  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  Quit smoking 3 years ago.  Smoked 1 pack/day for 15 years. - Maternal grandmother had DVT.  Mother had uterine cancer.  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  Serum IgG4 levels were normal. - She has developed bleeding after liver biopsy.  4.  Ischemic stroke: - Presented with right-sided weakness.  PMH of hypertension, hyperlipidemia, obesity.  Hypercoagulable work-up showed normal protein C, protein S and  ATIII.  [place] [address] they were normal.  IgM anticardiolipin antibody was elevated at 45.  LA and beta-2 glycoprotein 1 normal. - She was evaluated by Dr. Janese Banks and repeat IgM anticardiolipin antibody normalized.  No evidence for diagnosis of antiphospholipid antibody syndrome.   PLAN:  Iron deficiency state: - We talked about parenteral iron therapy in the form of Feraheme/Venofer. - We will arrange for parenteral iron therapy.  We discussed side effects in detail.  RTC 3 months with repeat CBC, ferritin, iron panel, 123456 and folic acid.   All questions were answered. The patient knows to call the clinic with any problems, questions or concerns.  Derek Jack, MD 01/06/22 9:40 AM  [facility] [phone]   I, Thana Ates, am acting as a scribe for Dr. Derek Jack.  I, Derek Jack MD, have reviewed the above documentation for accuracy and completeness, and I agree with the above.

## 2022-01-11 ENCOUNTER — Inpatient Hospital Stay (HOSPITAL_COMMUNITY): Payer: Commercial Managed Care - PPO

## 2022-01-11 ENCOUNTER — Other Ambulatory Visit (HOSPITAL_COMMUNITY): Payer: Self-pay

## 2022-01-11 VITALS — BP 123/70 | HR 64 | Temp 97.5°F | Resp 20

## 2022-01-11 DIAGNOSIS — D509 Iron deficiency anemia, unspecified: Secondary | ICD-10-CM

## 2022-01-11 DIAGNOSIS — Z8673 Personal history of transient ischemic attack (TIA), and cerebral infarction without residual deficits: Secondary | ICD-10-CM

## 2022-01-11 DIAGNOSIS — R760 Raised antibody titer: Secondary | ICD-10-CM

## 2022-01-11 DIAGNOSIS — D500 Iron deficiency anemia secondary to blood loss (chronic): Secondary | ICD-10-CM | POA: Diagnosis not present

## 2022-01-11 LAB — D-DIMER, QUANTITATIVE: D-Dimer, Quant: 0.77 ug/mL-FEU — ABNORMAL HIGH (ref 0.00–0.50)

## 2022-01-11 MED ORDER — ACETAMINOPHEN 325 MG PO TABS
650.0000 mg | ORAL_TABLET | Freq: Once | ORAL | Status: AC
  Administered 2022-01-11: 650 mg via ORAL
  Filled 2022-01-11: qty 2

## 2022-01-11 MED ORDER — LORATADINE 10 MG PO TABS
10.0000 mg | ORAL_TABLET | Freq: Once | ORAL | Status: AC
  Administered 2022-01-11: 10 mg via ORAL
  Filled 2022-01-11: qty 1

## 2022-01-11 MED ORDER — SODIUM CHLORIDE 0.9 % IV SOLN
300.0000 mg | Freq: Once | INTRAVENOUS | Status: AC
  Administered 2022-01-11: 300 mg via INTRAVENOUS
  Filled 2022-01-11: qty 300

## 2022-01-11 MED ORDER — SODIUM CHLORIDE 0.9 % IV SOLN: Freq: Once | INTRAVENOUS | Status: AC

## 2022-01-11 NOTE — Patient Instructions (Signed)
[facility]  Discharge Instructions: Thank you for choosing [facility] to provide your oncology and hematology care.  If you have a lab appointment with the Cancer Center, please come in thru the Main Entrance and check in at the main information desk.  Wear comfortable clothing and clothing appropriate for easy access to any Portacath or PICC line.   We strive to give you quality time with your provider. You may need to reschedule your appointment if you arrive late (15 or more minutes).  Arriving late affects you and other patients whose appointments are after yours.  Also, if you miss three or more appointments without notifying the office, you may be dismissed from the clinic at the provider's discretion.      For prescription refill requests, have your pharmacy contact our office and allow 72 hours for refills to be completed.    Today you received venofer 300mg       To help prevent nausea and vomiting after your treatment, we encourage you to take your nausea medication as directed.  BELOW ARE SYMPTOMS THAT SHOULD BE REPORTED IMMEDIATELY: *FEVER GREATER THAN 100.4 F (38 C) OR HIGHER *CHILLS OR SWEATING *NAUSEA AND VOMITING THAT IS NOT CONTROLLED WITH YOUR NAUSEA MEDICATION *UNUSUAL SHORTNESS OF BREATH *UNUSUAL BRUISING OR BLEEDING *URINARY PROBLEMS (pain or burning when urinating, or frequent urination) *BOWEL PROBLEMS (unusual diarrhea, constipation, pain near the anus) TENDERNESS IN MOUTH AND THROAT WITH OR WITHOUT PRESENCE OF ULCERS (sore throat, sores in mouth, or a toothache) UNUSUAL RASH, SWELLING OR PAIN  UNUSUAL VAGINAL DISCHARGE OR ITCHING   Items with * indicate a potential emergency and should be followed up as soon as possible or go to the Emergency Department if any problems should occur.  Please show the CHEMOTHERAPY ALERT CARD or IMMUNOTHERAPY ALERT CARD at check-in to the Emergency Department and triage nurse.  Should you have questions  after your visit or need to cancel or reschedule your appointment, please contact [facility] [phone]  and follow the prompts.  Office hours are 8:00 a.m. to 4:30 p.m. Monday - Friday. Please note that voicemails left after 4:00 p.m. may not be returned until the following business day.  We are closed weekends and major holidays. You have access to a nurse at all times for urgent questions. Please call the main number to the clinic [phone] and follow the prompts.  For any non-urgent questions, you may also contact your provider using MyChart. We now offer e-Visits for anyone 7 and older to request care online for non-urgent symptoms. For details visit [email or web address].   Also download the MyChart app! Go to the app store, search "MyChart", open the app, select [address], and log in with your MyChart username and password.  Masks are optional in the cancer centers. If you would like for your care team to wear a mask while they are taking care of you, please let them know. For doctor visits, patients may have with them one support person who is at least 47 years old. At this time, visitors are not allowed in the infusion area.

## 2022-01-11 NOTE — Progress Notes (Signed)
Venofer infusion given per orders. Patient tolerated it well without problems. Vitals stable and discharged home from clinic ambulatory. Follow up as scheduled.  

## 2022-01-11 NOTE — Progress Notes (Signed)
Order placed for D-Dimer today as requested per patient. Dr. Ellin Saba made aware.

## 2022-01-12 ENCOUNTER — Encounter (HOSPITAL_COMMUNITY): Payer: Self-pay

## 2022-01-19 ENCOUNTER — Inpatient Hospital Stay: Payer: Commercial Managed Care - PPO

## 2022-01-20 ENCOUNTER — Other Ambulatory Visit: Payer: Self-pay

## 2022-01-20 DIAGNOSIS — R7989 Other specified abnormal findings of blood chemistry: Secondary | ICD-10-CM

## 2022-01-20 DIAGNOSIS — Z86711 Personal history of pulmonary embolism: Secondary | ICD-10-CM

## 2022-01-20 NOTE — Progress Notes (Signed)
Order placed per Dr. Marice Potter verbal CT PE protocol.

## 2022-01-26 ENCOUNTER — Inpatient Hospital Stay: Payer: Commercial Managed Care - PPO | Attending: Hematology

## 2022-01-26 VITALS — BP 120/72 | HR 73 | Temp 97.6°F | Resp 20

## 2022-01-26 DIAGNOSIS — D509 Iron deficiency anemia, unspecified: Secondary | ICD-10-CM

## 2022-01-26 DIAGNOSIS — E611 Iron deficiency: Secondary | ICD-10-CM | POA: Diagnosis present

## 2022-01-26 DIAGNOSIS — Z8673 Personal history of transient ischemic attack (TIA), and cerebral infarction without residual deficits: Secondary | ICD-10-CM | POA: Diagnosis not present

## 2022-01-26 MED ORDER — LORATADINE 10 MG PO TABS: 10.0000 mg | ORAL_TABLET | Freq: Once | ORAL | Status: DC

## 2022-01-26 MED ORDER — SODIUM CHLORIDE 0.9 % IV SOLN
300.0000 mg | Freq: Once | INTRAVENOUS | Status: AC
  Administered 2022-01-26: 300 mg via INTRAVENOUS
  Filled 2022-01-26: qty 300

## 2022-01-26 MED ORDER — SODIUM CHLORIDE 0.9 % IV SOLN: Freq: Once | INTRAVENOUS | Status: AC

## 2022-01-26 MED ORDER — ACETAMINOPHEN 325 MG PO TABS
650.0000 mg | ORAL_TABLET | Freq: Once | ORAL | Status: AC
  Administered 2022-01-26: 650 mg via ORAL

## 2022-01-26 NOTE — Progress Notes (Signed)
Iron infusion given per orders. Patient tolerated it well without problems. Vitals stable and discharged home from clinic ambulatory. Follow up as scheduled.  

## 2022-01-26 NOTE — Patient Instructions (Signed)
[facility]  Discharge Instructions: Thank you for choosing [facility] to provide your oncology and hematology care.  If you have a lab appointment with the Cancer Center, please come in thru the Main Entrance and check in at the main information desk.  Wear comfortable clothing and clothing appropriate for easy access to any Portacath or PICC line.   We strive to give you quality time with your provider. You may need to reschedule your appointment if you arrive late (15 or more minutes).  Arriving late affects you and other patients whose appointments are after yours.  Also, if you miss three or more appointments without notifying the office, you may be dismissed from the clinic at the provider's discretion.      For prescription refill requests, have your pharmacy contact our office and allow 72 hours for refills to be completed.    Today you received venofer infusion       To help prevent nausea and vomiting after your treatment, we encourage you to take your nausea medication as directed.  BELOW ARE SYMPTOMS THAT SHOULD BE REPORTED IMMEDIATELY: *FEVER GREATER THAN 100.4 F (38 C) OR HIGHER *CHILLS OR SWEATING *NAUSEA AND VOMITING THAT IS NOT CONTROLLED WITH YOUR NAUSEA MEDICATION *UNUSUAL SHORTNESS OF BREATH *UNUSUAL BRUISING OR BLEEDING *URINARY PROBLEMS (pain or burning when urinating, or frequent urination) *BOWEL PROBLEMS (unusual diarrhea, constipation, pain near the anus) TENDERNESS IN MOUTH AND THROAT WITH OR WITHOUT PRESENCE OF ULCERS (sore throat, sores in mouth, or a toothache) UNUSUAL RASH, SWELLING OR PAIN  UNUSUAL VAGINAL DISCHARGE OR ITCHING   Items with * indicate a potential emergency and should be followed up as soon as possible or go to the Emergency Department if any problems should occur.  Please show the CHEMOTHERAPY ALERT CARD or IMMUNOTHERAPY ALERT CARD at check-in to the Emergency Department and triage nurse.  Should you  have questions after your visit or need to cancel or reschedule your appointment, please contact [facility] [phone]  and follow the prompts.  Office hours are 8:00 a.m. to 4:30 p.m. Monday - Friday. Please note that voicemails left after 4:00 p.m. may not be returned until the following business day.  We are closed weekends and major holidays. You have access to a nurse at all times for urgent questions. Please call the main number to the clinic [phone] and follow the prompts.  For any non-urgent questions, you may also contact your provider using MyChart. We now offer e-Visits for anyone 57 and older to request care online for non-urgent symptoms. For details visit [email or web address].   Also download the MyChart app! Go to the app store, search "MyChart", open the app, select [place], and log in with your MyChart username and password.  Masks are optional in the cancer centers. If you would like for your care team to wear a mask while they are taking care of you, please let them know. For doctor visits, patients may have with them one support person who is at least 47 years old. At this time, visitors are not allowed in the infusion area.

## 2022-01-31 ENCOUNTER — Ambulatory Visit: Payer: Commercial Managed Care - PPO

## 2022-02-01 ENCOUNTER — Ambulatory Visit
Admission: RE | Admit: 2022-02-01 | Discharge: 2022-02-01 | Disposition: A | Discharge status: Home / Self Care | Payer: Commercial Managed Care - PPO | Source: Ambulatory Visit | Attending: Hematology | Admitting: Hematology

## 2022-02-01 DIAGNOSIS — R7989 Other specified abnormal findings of blood chemistry: Secondary | ICD-10-CM

## 2022-02-01 DIAGNOSIS — Z86711 Personal history of pulmonary embolism: Secondary | ICD-10-CM | POA: Diagnosis present

## 2022-02-01 MED ORDER — IOHEXOL 350 MG/ML SOLN
75.0000 mL | Freq: Once | INTRAVENOUS | Status: AC | PRN
  Administered 2022-02-01: 75 mL via INTRAVENOUS

## 2022-02-06 ENCOUNTER — Inpatient Hospital Stay (HOSPITAL_BASED_OUTPATIENT_CLINIC_OR_DEPARTMENT_OTHER): Payer: Commercial Managed Care - PPO | Admitting: Hematology

## 2022-02-06 ENCOUNTER — Inpatient Hospital Stay: Payer: Commercial Managed Care - PPO

## 2022-02-06 VITALS — BP 126/77 | HR 64 | Temp 97.3°F | Resp 18

## 2022-02-06 DIAGNOSIS — D509 Iron deficiency anemia, unspecified: Secondary | ICD-10-CM

## 2022-02-06 DIAGNOSIS — E611 Iron deficiency: Secondary | ICD-10-CM | POA: Diagnosis not present

## 2022-02-06 DIAGNOSIS — R0989 Other specified symptoms and signs involving the circulatory and respiratory systems: Secondary | ICD-10-CM | POA: Diagnosis not present

## 2022-02-06 MED ORDER — SODIUM CHLORIDE 0.9 % IV SOLN: Freq: Once | INTRAVENOUS | Status: AC

## 2022-02-06 MED ORDER — SODIUM CHLORIDE 0.9 % IV SOLN
400.0000 mg | Freq: Once | INTRAVENOUS | Status: AC
  Administered 2022-02-06: 400 mg via INTRAVENOUS
  Filled 2022-02-06: qty 20

## 2022-02-06 MED ORDER — ACETAMINOPHEN 325 MG PO TABS
650.0000 mg | ORAL_TABLET | Freq: Once | ORAL | Status: AC
  Administered 2022-02-06: 650 mg via ORAL
  Filled 2022-02-06: qty 2

## 2022-02-06 MED ORDER — LORATADINE 10 MG PO TABS
10.0000 mg | ORAL_TABLET | Freq: Once | ORAL | Status: AC
  Administered 2022-02-06: 10 mg via ORAL
  Filled 2022-02-06: qty 1

## 2022-02-06 NOTE — Progress Notes (Signed)
[place], [place] [address]   CLINIC:  Medical Oncology/Hematology  PCP:  Tracie Harrier, MD [address] [place] [address] [phone]   REASON FOR VISIT:  Follow-up for iron deficiency state, history of ischemic stroke.  CURRENT THERAPY: Venofer x3 from 01/11/2022 through 02/06/2022  BRIEF ONCOLOGIC HISTORY:  Oncology History   No history exists.    CANCER STAGING:  Cancer Staging  No matching staging information was found for the patient.   INTERVAL HISTORY:  Tanya Henderson 46 y.o. female returns for follow-up of elevated D-dimer and CT scan findings.  She is currently receiving third infusion of Venofer today.  She reports improvement in energy levels after the first 2 infusions.    REVIEW OF SYSTEMS:  Review of Systems  Neurological:  Positive for dizziness and numbness (Feet).  All other systems reviewed and are negative.    PAST MEDICAL/SURGICAL HISTORY:  Past Medical History:  Diagnosis Date   Abnormal LFTs    Anxiety    B12 deficiency    Bronchitis    Cholelithiasis 12/16/2013   Chronic kidney disease    Kidney stones   Depression 12/16/2013   GERD (gastroesophageal reflux disease) 12/16/2013   Headache    Hidradenitis    History of smoking    History of stroke    History of stroke 05/23/2021   Migraine 12/16/2013   Renal calculi    Vitamin D deficiency    Past Surgical History:  Procedure Laterality Date   CHOLECYSTECTOMY     CYSTOSCOPY W/ URETERAL STENT PLACEMENT Right 09/21/2015   Procedure: CYSTOSCOPY WITH STENT REPLACEMENT;  Surgeon: Hollice Espy, MD;  Location: [facility];  Service: Urology;  Laterality: Right;   CYSTOSCOPY W/ URETERAL STENT REMOVAL Right 02/10/2015   Procedure: CYSTOSCOPY WITH STENT REMOVAL;  Surgeon: Collier Flowers, MD;  Location: [facility];  Service: Urology;  Laterality: Right;   CYSTOSCOPY WITH STENT PLACEMENT Right 01/29/2015   Procedure:  CYSTOSCOPY WITH STENT PLACEMENT;  Surgeon: Ardis Hughs, MD;  Location: [facility];  Service: Urology;  Laterality: Right;   CYSTOSCOPY WITH STENT PLACEMENT Right 02/10/2015   Procedure: CYSTOSCOPY WITH STENT PLACEMENT;  Surgeon: Collier Flowers, MD;  Location: [facility];  Service: Urology;  Laterality: Right;   CYSTOSCOPY WITH STENT PLACEMENT Right 09/07/2015   Procedure: CYSTOSCOPY WITH STENT PLACEMENT;  Surgeon: Ardis Hughs, MD;  Location: [facility];  Service: Urology;  Laterality: Right;   CYSTOSCOPY/RETROGRADE/URETEROSCOPY/STONE EXTRACTION WITH BASKET  05/26/2015   Procedure: CYSTOSCOPY/RETROGRADE/URETEROSCOPY/STONE EXTRACTION WITH BASKET;  Surgeon: Hollice Espy, MD;  Location: [facility];  Service: Urology;;   CYSTOSCOPY/URETEROSCOPY/HOLMIUM LASER  05/26/2015   Procedure: CYSTOSCOPY/URETEROSCOPY/HOLMIUM LASER;  Surgeon: Hollice Espy, MD;  Location: [facility];  Service: Urology;;   EXTRACORPOREAL SHOCK WAVE LITHOTRIPSY Right 04/22/2015   Procedure: EXTRACORPOREAL SHOCK WAVE LITHOTRIPSY (ESWL);  Surgeon: Nickie Retort, MD;  Location: [facility];  Service: Urology;  Laterality: Right;   KIDNEY STONE SURGERY     LIVER BIOPSY  10/03/2021   OOPHORECTOMY Right 1996   Ovarian cyst   URETEROSCOPY WITH HOLMIUM LASER LITHOTRIPSY Right 02/10/2015   Procedure: URETEROSCOPY WITH HOLMIUM LASER LITHOTRIPSY;  Surgeon: Collier Flowers, MD;  Location: [facility];  Service: Urology;  Laterality: Right;   URETEROSCOPY WITH HOLMIUM LASER LITHOTRIPSY Right 09/21/2015   Procedure: URETEROSCOPY WITH HOLMIUM LASER LITHOTRIPSY/ fragmentation and removal;  Surgeon: Hollice Espy, MD;  Location: [facility];  Service: Urology;  Laterality: Right;  SOCIAL HISTORY:  Social History   Socioeconomic History   Marital status: Divorced    Spouse name: Not on file   Number of children: Not on file   Years of education: Not on file   Highest education level: Not on file  Occupational History   Not on file   Tobacco Use   Smoking status: Former    Packs/day: 1.00    Years: 15.00    Total pack years: 15.00    Types: Cigarettes    Quit date: 06/25/2019    Years since quitting: 2.6    Passive exposure: Never   Smokeless tobacco: Never  Vaping Use   Vaping Use: Never used  Substance and Sexual Activity   Alcohol use: Yes    Comment: rare   Drug use: No   Sexual activity: Yes  Other Topics Concern   Not on file  Social History Narrative   CNA at [employer]   One son- 106 yo.   Social Determinants of Health   Financial Resource Strain: Not on file  Food Insecurity: Not on file  Transportation Needs: Not on file  Physical Activity: Not on file  Stress: Not on file  Social Connections: Not on file  Intimate Partner Violence: Not on file    FAMILY HISTORY:  Family History  Problem Relation Age of Onset   Lupus Mother    Hypertension Mother    Uterine cancer Mother    Diabetes Paternal Grandmother    Nephrolithiasis Paternal Grandfather    Diabetes Maternal Aunt    Breast cancer Neg Hx     CURRENT MEDICATIONS:  Outpatient Encounter Medications as of 02/06/2022  Medication Sig   BIOTIN PO Take by mouth daily.   cetirizine (ZYRTEC) 10 MG tablet Take 10 mg by mouth daily.   clobetasol ointment (TEMOVATE) 0.05 % Apply 1 application  topically 2 (two) times daily.   doxepin (SINEQUAN) 25 MG capsule Take 25 mg by mouth at bedtime.   iron polysaccharides (NIFEREX) 150 MG capsule Take 150 mg by mouth daily.   losartan (COZAAR) 25 MG tablet Take 1 tablet (25 mg total) by mouth daily. Skip dose if systolic BP less than 130 mmHg   Multiple Vitamin (MULTI-VITAMIN) tablet Take 1 tablet by mouth daily.   omeprazole (PRILOSEC) 20 MG capsule Take 1 capsule by mouth daily.   ondansetron (ZOFRAN) 4 MG tablet Take 4 mg by mouth every 8 (eight) hours as needed.   Probiotic Product (ADVANCED PROBIOTIC-14) CAPS Take 1 capsule by mouth daily.   ursodiol (ACTIGALL) 300 MG capsule Take 900 mg by  mouth 2 (two) times daily. Take 3 capsule twice daily   VITAMIN D, CHOLECALCIFEROL, PO Take by mouth daily.   [EXPIRED] iron sucrose (VENOFER) 400 mg in sodium chloride 0.9 % 250 mL IVPB    No facility-administered encounter medications on file as of 02/06/2022.    ALLERGIES:  Allergies  Allergen Reactions   Clindamycin/Lincomycin    Penicillins Other (See Comments)    Reaction:  Unknown      PHYSICAL EXAM:  ECOG Performance status: 0  There were no vitals filed for this visit. There were no vitals filed for this visit. Physical Exam Vitals reviewed.  Constitutional:      Appearance: Normal appearance.  Cardiovascular:     Rate and Rhythm: Regular rhythm.     Heart sounds: Normal heart sounds.  Pulmonary:     Breath sounds: Normal breath sounds.  Neurological:     Mental  Status: She is alert and oriented to person, place, and time.  Psychiatric:        Mood and Affect: Mood normal.        Behavior: Behavior normal.      LABORATORY DATA:  I have reviewed the labs as listed.  CBC    Component Value Date/Time   WBC 10.3 10/08/2021 2002   RBC 4.95 10/08/2021 2002   HGB 13.8 10/08/2021 2002   HGB 14.7 03/24/2013 1809   HCT 44.2 10/08/2021 2002   HCT 43.6 03/24/2013 1809   PLT 300 10/08/2021 2002   PLT 281 03/24/2013 1809   MCV 89.3 10/08/2021 2002   MCV 89 03/24/2013 1809   MCH 27.9 10/08/2021 2002   MCHC 31.2 10/08/2021 2002   RDW 13.3 10/08/2021 2002   RDW 13.8 03/24/2013 1809   LYMPHSABS 1.4 10/08/2021 2002   MONOABS 0.8 10/08/2021 2002   EOSABS 0.2 10/08/2021 2002   BASOSABS 0.1 10/08/2021 2002      Latest Ref Rng & Units 10/08/2021    8:02 PM 08/16/2021   10:16 AM 06/09/2021    5:28 PM  CMP  Glucose 70 - 99 mg/dL 104  109  123   BUN 6 - 20 mg/dL 16  14  9    Creatinine 0.44 - 1.00 mg/dL 0.73  0.69  0.72   Sodium 135 - 145 mmol/L 136  135  134   Potassium 3.5 - 5.1 mmol/L 5.3  3.7  4.0   Chloride 98 - 111 mmol/L 100  102  100   CO2 22 - 32 mmol/L  25  25  21    Calcium 8.9 - 10.3 mg/dL 10.9  9.0  8.5   Total Protein 6.5 - 8.1 g/dL 8.2   7.6   Total Bilirubin 0.3 - 1.2 mg/dL 2.6   1.5   Alkaline Phos 38 - 126 U/L 295   486   AST 15 - 41 U/L 164   256   ALT 0 - 44 U/L 140   334     DIAGNOSTIC IMAGING:  I have independently reviewed the scans and discussed with the patient.  ASSESSMENT: Iron deficiency state from bleeding: - Labs on 11/08/2021 with ferritin 3.5 and hemoglobin 8.8. - She received IN[facility]D on 11/17/[facility]21 at [facility].  Subsequent hemoglobin improved but her ferritin was low at 32. - She is on iron tablets daily since May 2023.  Denies any ice pica.  No BRBPR.  Never had colonoscopy.  She had blood transfusion in May 2023.  Menstrual bleeding is variable and occasionally heavy.    Social/family history: - Lives with her mother and son.  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  Quit smoking 3 years ago.  Smoked 1 pack/day for 15 years. - Maternal grandmother had DVT.  Mother had uterine cancer.  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  Serum IgG4 levels were normal. - She has developed bleeding after liver biopsy.  4.  Ischemic stroke: - Presented with right-sided weakness.  PMH of hypertension, hyperlipidemia, obesity.  Hypercoagulable work-up showed normal protein C, protein S and ATIII.  [place] [address] they were normal.  IgM anticardiolipin antibody was elevated at 45.  LA and beta-2 glycoprotein 1 normal. - She was evaluated  by Dr. Smith Robert and repeat IgM anticardiolipin antibody normalized.  No evidence for diagnosis of antiphospholipid antibody syndrome.   PLAN:  1.  Iron deficiency state: - She will proceed with third dose of Venofer today.  She already noticed improvement in  energy levels.  She will come back in October with repeat iron and CBC.  2.  Elevated D-dimer: - She was found to have elevated D-dimer of 0.77 on 01/11/2022.  She had a history of pulmonary embolism when she was hospitalized at [facility] but could not be anticoagulated due to bleeding issues at that time. - I have done CT angiogram of the chest.  CT angio on 02/01/2022 was negative for pulmonary embolism.  However it showed increased caliber of the main pulmonary artery compatible with pulmonary artery hypertension.  6 mm pulmonary nodule consistent with intrapulmonary lymph node considered benign. - I have reviewed her prior echocardiogram from 05/24/2021 with no evidence of pulmonary hypertension. - She does report dyspnea on minimal exertion.  Hence have recommended repeating an echocardiogram prior to her next visit in October.      Orders placed this encounter:  Orders Placed This Encounter  Procedures   ECHOCARDIOGRAM COMPLETE      Doreatha Massed, MD [facility] [phone]

## 2022-02-06 NOTE — Patient Instructions (Signed)
[facility]  Discharge Instructions: Thank you for choosing [facility] to provide your oncology and hematology care.  If you have a lab appointment with the Cancer Center, please come in thru the Main Entrance and check in at the main information desk.  Wear comfortable clothing and clothing appropriate for easy access to any Portacath or PICC line.   We strive to give you quality time with your provider. You may need to reschedule your appointment if you arrive late (15 or more minutes).  Arriving late affects you and other patients whose appointments are after yours.  Also, if you miss three or more appointments without notifying the office, you may be dismissed from the clinic at the provider's discretion.      For prescription refill requests, have your pharmacy contact our office and allow 72 hours for refills to be completed.    Today you received the following Venofer, return as scheduled.   To help prevent nausea and vomiting after your treatment, we encourage you to take your nausea medication as directed.  BELOW ARE SYMPTOMS THAT SHOULD BE REPORTED IMMEDIATELY: *FEVER GREATER THAN 100.4 F (38 C) OR HIGHER *CHILLS OR SWEATING *NAUSEA AND VOMITING THAT IS NOT CONTROLLED WITH YOUR NAUSEA MEDICATION *UNUSUAL SHORTNESS OF BREATH *UNUSUAL BRUISING OR BLEEDING *URINARY PROBLEMS (pain or burning when urinating, or frequent urination) *BOWEL PROBLEMS (unusual diarrhea, constipation, pain near the anus) TENDERNESS IN MOUTH AND THROAT WITH OR WITHOUT PRESENCE OF ULCERS (sore throat, sores in mouth, or a toothache) UNUSUAL RASH, SWELLING OR PAIN  UNUSUAL VAGINAL DISCHARGE OR ITCHING   Items with * indicate a potential emergency and should be followed up as soon as possible or go to the Emergency Department if any problems should occur.  Please show the CHEMOTHERAPY ALERT CARD or IMMUNOTHERAPY ALERT CARD at check-in to the Emergency Department and triage  nurse.  Should you have questions after your visit or need to cancel or reschedule your appointment, please contact [facility] [phone]  and follow the prompts.  Office hours are 8:00 a.m. to 4:30 p.m. Monday - Friday. Please note that voicemails left after 4:00 p.m. may not be returned until the following business day.  We are closed weekends and major holidays. You have access to a nurse at all times for urgent questions. Please call the main number to the clinic [phone] and follow the prompts.  For any non-urgent questions, you may also contact your provider using MyChart. We now offer e-Visits for anyone 18 and older to request care online for non-urgent symptoms. For details visit [email or web address].   Also download the MyChart app! Go to the app store, search "MyChart", open the app, select [facility], and log in with your MyChart username and password.  Masks are optional in the cancer centers. If you would like for your care team to wear a mask while they are taking care of you, please let them know. You may have one support person who is at least 47 years old accompany you for your appointments.  

## 2022-02-06 NOTE — Progress Notes (Signed)
Patient tolerated iron infusion with no complaints voiced. Peripheral IV site clean and dry with good blood return noted before and after infusion. Patient refused to wait 30 minute recommended wait time.Band aid applied. VSS with discharge and left in satisfactory condition with no s/s of distress noted.  

## 2022-02-08 ENCOUNTER — Inpatient Hospital Stay: Payer: Commercial Managed Care - PPO | Admitting: Hematology

## 2022-04-10 ENCOUNTER — Inpatient Hospital Stay: Payer: Commercial Managed Care - PPO | Attending: Hematology

## 2022-04-10 ENCOUNTER — Ambulatory Visit (HOSPITAL_BASED_OUTPATIENT_CLINIC_OR_DEPARTMENT_OTHER)
Admission: RE | Admit: 2022-04-10 | Discharge: 2022-04-10 | Disposition: A | Discharge status: Home / Self Care | Payer: Commercial Managed Care - PPO | Source: Ambulatory Visit | Attending: Hematology | Admitting: Hematology

## 2022-04-10 DIAGNOSIS — E611 Iron deficiency: Secondary | ICD-10-CM | POA: Diagnosis present

## 2022-04-10 DIAGNOSIS — D509 Iron deficiency anemia, unspecified: Secondary | ICD-10-CM

## 2022-04-10 DIAGNOSIS — R0989 Other specified symptoms and signs involving the circulatory and respiratory systems: Secondary | ICD-10-CM

## 2022-04-10 DIAGNOSIS — I272 Pulmonary hypertension, unspecified: Secondary | ICD-10-CM

## 2022-04-10 LAB — CBC WITH DIFFERENTIAL/PLATELET
Abs Immature Granulocytes: 0.03 10*3/uL (ref 0.00–0.07)
Basophils Absolute: 0.1 10*3/uL (ref 0.0–0.1)
Basophils Relative: 1 %
Eosinophils Absolute: 0.4 10*3/uL (ref 0.0–0.5)
Eosinophils Relative: 5 %
HCT: 46 % (ref 36.0–46.0)
Hemoglobin: 14.8 g/dL (ref 12.0–15.0)
Immature Granulocytes: 0 %
Lymphocytes Relative: 23 %
Lymphs Abs: 1.6 10*3/uL (ref 0.7–4.0)
MCH: 30.2 pg (ref 26.0–34.0)
MCHC: 32.2 g/dL (ref 30.0–36.0)
MCV: 93.9 fL (ref 80.0–100.0)
Monocytes Absolute: 0.4 10*3/uL (ref 0.1–1.0)
Monocytes Relative: 6 %
Neutro Abs: 4.2 10*3/uL (ref 1.7–7.7)
Neutrophils Relative %: 65 %
Platelets: 251 10*3/uL (ref 150–400)
RBC: 4.9 MIL/uL (ref 3.87–5.11)
RDW: 13.5 % (ref 11.5–15.5)
WBC: 6.7 10*3/uL (ref 4.0–10.5)
nRBC: 0 % (ref 0.0–0.2)

## 2022-04-10 LAB — ECHOCARDIOGRAM COMPLETE
AR max vel: 2.49 cm2
AV Area VTI: 2.47 cm2
AV Area mean vel: 2.34 cm2
AV Mean grad: 6.7 mmHg
AV Peak grad: 13.5 mmHg
Ao pk vel: 1.84 m/s
Area-P 1/2: 3.12 cm2
S' Lateral: 2.8 cm

## 2022-04-10 LAB — IRON AND TIBC
Iron: 133 ug/dL (ref 28–170)
Saturation Ratios: 35 % — ABNORMAL HIGH (ref 10.4–31.8)
TIBC: 385 ug/dL (ref 250–450)
UIBC: 252 ug/dL

## 2022-04-10 LAB — FERRITIN: Ferritin: 57 ng/mL (ref 11–307)

## 2022-04-10 LAB — FOLATE: Folate: 13.2 ng/mL (ref >5.9)

## 2022-04-10 LAB — VITAMIN B12: Vitamin B-12: 233 pg/mL (ref 180–914)

## 2022-04-10 NOTE — Progress Notes (Signed)
*  PRELIMINARY RESULTS* Echocardiogram 2D Echocardiogram has been performed.  Tanya Henderson 04/10/2022, 10:44 AM

## 2022-04-17 ENCOUNTER — Ambulatory Visit: Payer: Commercial Managed Care - PPO | Admitting: Hematology

## 2022-04-24 ENCOUNTER — Ambulatory Visit: Payer: Commercial Managed Care - PPO | Admitting: Hematology

## 2022-05-09 ENCOUNTER — Inpatient Hospital Stay: Payer: Commercial Managed Care - PPO | Attending: Hematology | Admitting: Hematology

## 2022-05-09 VITALS — BP 140/97 | HR 82 | Temp 98.0°F | Resp 20 | Ht 64.0 in | Wt 293.7 lb

## 2022-05-09 DIAGNOSIS — D509 Iron deficiency anemia, unspecified: Secondary | ICD-10-CM | POA: Diagnosis not present

## 2022-05-09 DIAGNOSIS — R7989 Other specified abnormal findings of blood chemistry: Secondary | ICD-10-CM | POA: Insufficient documentation

## 2022-05-09 DIAGNOSIS — E611 Iron deficiency: Secondary | ICD-10-CM | POA: Insufficient documentation

## 2022-05-09 DIAGNOSIS — Z8673 Personal history of transient ischemic attack (TIA), and cerebral infarction without residual deficits: Secondary | ICD-10-CM | POA: Insufficient documentation

## 2022-05-09 NOTE — Progress Notes (Signed)
[facility] [address], [place] [address]   CLINIC:  Medical Oncology/Hematology  PCP:  Barbette Reichmann, MD [address] [place] [address] [phone]   REASON FOR VISIT:  Follow-up for iron deficiency state, history of ischemic stroke.  CURRENT THERAPY: Venofer x3 from 01/11/2022 through 02/06/2022  BRIEF ONCOLOGIC HISTORY:  Oncology History   No history exists.    CANCER STAGING:  Cancer Staging  No matching staging information was found for the patient.   INTERVAL HISTORY:  Tanya Henderson 46 y.o. female seen for follow-up of elevated D-dimer and iron deficiency state.  She has received last Venofer on 02/06/2022.  Reports energy levels of 60 to 70%.  Reports feeling foggy headed lately.  Also reported tightness in the upper part of the chest which lasts about few minutes and occurs with or without exertion.  No nausea but occasionally feels lightheaded.  No radiation of the pain.  No prior history of heart problems.  She is taking Prilosec for acid reflux.    REVIEW OF SYSTEMS:  Review of Systems  Cardiovascular:  Positive for chest pain.  Neurological:  Positive for dizziness.  All other systems reviewed and are negative.    PAST MEDICAL/SURGICAL HISTORY:  Past Medical History:  Diagnosis Date   Abnormal LFTs    Anxiety    B12 deficiency    Bronchitis    Cholelithiasis 12/16/2013   Chronic kidney disease    Kidney stones   Depression 12/16/2013   GERD (gastroesophageal reflux disease) 12/16/2013   Headache    Hidradenitis    History of smoking    History of stroke    History of stroke 05/23/2021   Migraine 12/16/2013   Renal calculi    Vitamin D deficiency    Past Surgical History:  Procedure Laterality Date   CHOLECYSTECTOMY     CYSTOSCOPY W/ URETERAL STENT PLACEMENT Right 09/21/2015   Procedure: CYSTOSCOPY WITH STENT REPLACEMENT;  Surgeon: Vanna Scotland, MD;  Location: [facility];  Service:  Urology;  Laterality: Right;   CYSTOSCOPY W/ URETERAL STENT REMOVAL Right 02/10/2015   Procedure: CYSTOSCOPY WITH STENT REMOVAL;  Surgeon: Lorraine Lax, MD;  Location: [facility];  Service: Urology;  Laterality: Right;   CYSTOSCOPY WITH STENT PLACEMENT Right 01/29/2015   Procedure: CYSTOSCOPY WITH STENT PLACEMENT;  Surgeon: Crist Fat, MD;  Location: [facility];  Service: Urology;  Laterality: Right;   CYSTOSCOPY WITH STENT PLACEMENT Right 02/10/2015   Procedure: CYSTOSCOPY WITH STENT PLACEMENT;  Surgeon: Lorraine Lax, MD;  Location: [facility];  Service: Urology;  Laterality: Right;   CYSTOSCOPY WITH STENT PLACEMENT Right 09/07/2015   Procedure: CYSTOSCOPY WITH STENT PLACEMENT;  Surgeon: Crist Fat, MD;  Location: [facility];  Service: Urology;  Laterality: Right;   CYSTOSCOPY/RETROGRADE/URETEROSCOPY/STONE EXTRACTION WITH BASKET  05/26/2015   Procedure: CYSTOSCOPY/RETROGRADE/URETEROSCOPY/STONE EXTRACTION WITH BASKET;  Surgeon: Vanna Scotland, MD;  Location: [facility];  Service: Urology;;   CYSTOSCOPY/URETEROSCOPY/HOLMIUM LASER  05/26/2015   Procedure: CYSTOSCOPY/URETEROSCOPY/HOLMIUM LASER;  Surgeon: Vanna Scotland, MD;  Location: [facility];  Service: Urology;;   EXTRACORPOREAL SHOCK WAVE LITHOTRIPSY Right 04/22/2015   Procedure: EXTRACORPOREAL SHOCK WAVE LITHOTRIPSY (ESWL);  Surgeon: Hildred Laser, MD;  Location: [facility];  Service: Urology;  Laterality: Right;   KIDNEY STONE SURGERY     LIVER BIOPSY  10/03/2021   OOPHORECTOMY Right 1996   Ovarian cyst   URETEROSCOPY WITH HOLMIUM LASER LITHOTRIPSY Right 02/10/2015   Procedure: URETEROSCOPY WITH HOLMIUM LASER  LITHOTRIPSY;  Surgeon: Lorraine Lax, MD;  Location: [facility];  Service: Urology;  Laterality: Right;   URETEROSCOPY WITH HOLMIUM LASER LITHOTRIPSY Right 09/21/2015   Procedure: URETEROSCOPY WITH HOLMIUM LASER LITHOTRIPSY/ fragmentation and removal;  Surgeon: Vanna Scotland, MD;  Location: [facility];  Service: Urology;   Laterality: Right;     SOCIAL HISTORY:  Social History   Socioeconomic History   Marital status: Divorced    Spouse name: Not on file   Number of children: Not on file   Years of education: Not on file   Highest education level: Not on file  Occupational History   Not on file  Tobacco Use   Smoking status: Former    Packs/day: 1.00    Years: 15.00    Total pack years: 15.00    Types: Cigarettes    Quit date: 06/25/2019    Years since quitting: 2.8    Passive exposure: Never   Smokeless tobacco: Never  Vaping Use   Vaping Use: Never used  Substance and Sexual Activity   Alcohol use: Yes    Comment: rare   Drug use: No   Sexual activity: Yes  Other Topics Concern   Not on file  Social History Narrative   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   Social Determinants of Health   Financial Resource Strain: Not on file  Food Insecurity: Not on file  Transportation Needs: Not on file  Physical Activity: Not on file  Stress: Not on file  Social Connections: Not on file  Intimate Partner Violence: Not on file    FAMILY HISTORY:  Family History  Problem Relation Age of Onset   Lupus Mother    Hypertension Mother    Uterine cancer Mother    Diabetes Paternal Grandmother    Nephrolithiasis Paternal Grandfather    Diabetes Maternal Aunt    Breast cancer Neg Hx     CURRENT MEDICATIONS:  Outpatient Encounter Medications as of 05/09/2022  Medication Sig   BIOTIN PO Take by mouth daily.   cetirizine (ZYRTEC) 10 MG tablet Take 10 mg by mouth daily.   clobetasol ointment (TEMOVATE) 0.05 % Apply 1 application  topically 2 (two) times daily.   doxepin (SINEQUAN) 25 MG capsule Take 25 mg by mouth at bedtime.   iron polysaccharides (NIFEREX) 150 MG capsule Take 150 mg by mouth daily.   losartan (COZAAR) 25 MG tablet Take 1 tablet (25 mg total) by mouth daily. Skip dose if systolic BP less than 130 mmHg   Multiple Vitamin (MULTI-VITAMIN) tablet Take 1 tablet by mouth daily.    omeprazole (PRILOSEC) 20 MG capsule Take 1 capsule by mouth daily.   ondansetron (ZOFRAN) 4 MG tablet Take 4 mg by mouth every 8 (eight) hours as needed.   Probiotic Product (ADVANCED PROBIOTIC-14) CAPS Take 1 capsule by mouth daily.   ursodiol (ACTIGALL) 300 MG capsule Take 900 mg by mouth 2 (two) times daily. Take 3 capsule twice daily   VITAMIN D, CHOLECALCIFEROL, PO Take by mouth daily.   No facility-administered encounter medications on file as of 05/09/2022.    ALLERGIES:  Allergies  Allergen Reactions   Clindamycin/Lincomycin    Penicillins Other (See Comments)    Reaction:  Unknown      PHYSICAL EXAM:  ECOG Performance status: 0  Vitals:   05/09/22 0809  BP: (!) 140/97  Pulse: 82  Resp: 20  Temp: 98 F (36.7 C)  SpO2: 97%   Filed Weights  05/09/22 0809  Weight: 293 lb 11.2 oz (133.2 kg)   Physical Exam Vitals reviewed.  Constitutional:      Appearance: Normal appearance.  Cardiovascular:     Rate and Rhythm: Regular rhythm.     Heart sounds: Normal heart sounds.  Pulmonary:     Breath sounds: Normal breath sounds.  Neurological:     Mental Status: She is alert and oriented to person, place, and time.  Psychiatric:        Mood and Affect: Mood normal.        Behavior: Behavior normal.     LABORATORY DATA:  I have reviewed the labs as listed.  CBC    Component Value Date/Time   WBC 6.7 04/10/2022 0911   RBC 4.90 04/10/2022 0911   HGB 14.8 04/10/2022 0911   HGB 14.7 03/24/2013 1809   HCT 46.0 04/10/2022 0911   HCT 43.6 03/24/2013 1809   PLT 251 04/10/2022 0911   PLT 281 03/24/2013 1809   MCV 93.9 04/10/2022 0911   MCV 89 03/24/2013 1809   MCH 30.2 04/10/2022 0911   MCHC 32.2 04/10/2022 0911   RDW 13.5 04/10/2022 0911   RDW 13.8 03/24/2013 1809   LYMPHSABS 1.6 04/10/2022 0911   MONOABS 0.4 04/10/2022 0911   EOSABS 0.4 04/10/2022 0911   BASOSABS 0.1 04/10/2022 0911      Latest Ref Rng & Units 10/08/2021    8:02 PM 08/16/2021   10:16 AM  06/09/2021    5:28 PM  CMP  Glucose 70 - 99 mg/dL 161104  096109  045123   BUN 6 - 20 mg/dL 16  14  9    Creatinine 0.44 - 1.00 mg/dL 4.090.73  8.110.69  9.140.72   Sodium 135 - 145 mmol/L 136  135  134   Potassium 3.5 - 5.1 mmol/L 5.3  3.7  4.0   Chloride 98 - 111 mmol/L 100  102  100   CO2 22 - 32 mmol/L 25  25  21    Calcium 8.9 - 10.3 mg/dL 78.210.9  9.0  8.5   Total Protein 6.5 - 8.1 g/dL 8.2   7.6   Total Bilirubin 0.3 - 1.2 mg/dL 2.6   1.5   Alkaline Phos 38 - 126 U/L 295   486   AST 15 - 41 U/L 164   256   ALT 0 - 44 U/L 140   334     DIAGNOSTIC IMAGING:  I have independently reviewed the scans and discussed with the patient.  ASSESSMENT: Iron deficiency state from bleeding: - Labs on 11/08/2021 with ferritin 3.5 and hemoglobin 8.8. - [facility]  Subsequent hemoglobin improved but her ferritin was low at 32. - She is on iron tablets daily since May 2023.  Denies any ice pica.  No BRBPR.  Never had colonoscopy.  She had blood transfusion in May 2023.  Menstrual bleeding is variable and occasionally heavy.    Social/family history: - Lives with her mother and son.  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  Quit smoking 3 years ago.  Smoked 1 pack/day for 15 years. - Maternal grandmother had DVT.  Mother had uterine cancer.  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  Serum IgG4 levels were  normal. - She has developed bleeding after liver biopsy.  4.  Ischemic stroke: - Presented with right-sided weakness.  PMH of hypertension, hyperlipidemia, obesity.  Hypercoagulable work-up showed normal protein C, protein S and ATIII.  [place] [address] they were normal.  IgM anticardiolipin antibody was elevated at 45.  LA and beta-2  glycoprotein 1 normal. - She was evaluated by Dr. Smith Robert and repeat IgM anticardiolipin antibody normalized.  No evidence for diagnosis of antiphospholipid antibody syndrome.   PLAN:  1.  Iron deficiency state: - Last Venofer was in August. - Labs from 04/10/2022: Ferritin 57, percent saturation 35.  B12, folic acid normal.  Hemoglobin-14.8. - Recommend starting iron tablet daily.  She was told to take her preferably on empty stomach with a glass of orange juice and avoid coffee/tea/calcium supplements within 2 hours of taking iron.  She is reportedly taking Prilosec for acid reflux which could decrease absorption. - Recommend repeating CBC, ferritin and iron panel in 4 months.  If there is any worsening or no improvement, will consider parenteral iron therapy again.  2.  Elevated D-dimer: - Elevated D-dimer of 0.77 on 01/11/2022.  History of pulmonary embolism when she was hospitalized at [facility], could not be anticoagulated secondary to bleeding issues at that time. - CT angiogram (02/01/2022): Negative for pulmonary embolism.  It showed increased caliber of the main pulmonary artery compatible with PAH.  6 mm lung nodule consistent with intrapulmonary lymph node, considered benign. - Echocardiogram (04/10/2022): LVEF 60 to 65%.  Right atrium and ventricle normal in size.  No indication of pulmonary arterial hypertension. - She reported chest tightness in the upper chest lasting few minutes, occasionally with dizziness.  If the pain worsens/persists, she was told to get evaluated with cardiology.   Orders placed this encounter:  No orders of the defined types were placed in this encounter.     Doreatha Massed, MD [facility] [phone]

## 2022-05-09 NOTE — Patient Instructions (Addendum)
[facility] at [facility] Discharge Instructions   You were seen and examined today by Dr. Ellin Saba.  He reviewed the result of your lab work which are normal.  The results of your echocardiogram were normal.   Start taking over the counter iron pills, 325 mg, once a day. Take on an empty stomach with orange juice (this will help with absorption of iron).   We will see you back in 4 months. We will repeat lab work prior to this visit.    Thank you for choosing [facility] at [facility] to provide your oncology and hematology care.  To afford each patient quality time with our provider, please arrive at least 15 minutes before your scheduled appointment time.   If you have a lab appointment with the Cancer Center please come in thru the Main Entrance and check in at the main information desk.  You need to re-schedule your appointment should you arrive 10 or more minutes late.  We strive to give you quality time with our providers, and arriving late affects you and other patients whose appointments are after yours.  Also, if you no show three or more times for appointments you may be dismissed from the clinic at the providers discretion.     Again, thank you for choosing [facility].  Our hope is that these requests will decrease the amount of time that you wait before being seen by our physicians.       _____________________________________________________________  Should you have questions after your visit to [facility], please contact our office at [phone] and follow the prompts.  Our office hours are 8:00 a.m. and 4:30 p.m. Monday - Friday.  Please note that voicemails left after 4:00 p.m. may not be returned until the following business day.  We are closed weekends and major holidays.  You do have access to a nurse 24-7, just call the main number to the clinic [phone] and do not press any options, hold on  the line and a nurse will answer the phone.    For prescription refill requests, have your pharmacy contact our office and allow 72 hours.    Due to Covid, you will need to wear a mask upon entering the hospital. If you do not have a mask, a mask will be given to you at the Main Entrance upon arrival. For doctor visits, patients may have 1 support person age 75 or older with them. For treatment visits, patients can not have anyone with them due to social distancing guidelines and our immunocompromised population.

## 2022-06-14 ENCOUNTER — Encounter: Payer: Self-pay | Admitting: *Deleted

## 2022-06-14 NOTE — Progress Notes (Signed)
Patient has had progressive severe fatigue, body aches and joint pain that has bee present for some months and has caused her to be limited to daily activities.  Per Dr. Ellin Saba, will place a referral to Dr. Corliss Skains for Rheumatology work up.

## 2022-06-24 IMAGING — MG MM DIGITAL SCREENING BILAT W/ TOMO AND CAD
6 of 10 series · 6 of 30 positions shown · non-contrast
Comparison: Previous exam(s).

CLINICAL DATA: Screening.

EXAM:
DIGITAL SCREENING BILATERAL MAMMOGRAM WITH TOMOSYNTHESIS AND CAD
TECHNIQUE: Bilateral screening digital craniocaudal and mediolateral oblique
mammograms were obtained. Bilateral screening digital breast
tomosynthesis was performed. The images were evaluated with
computer-aided detection.

[R MLO synth-2D (1 of 2)]
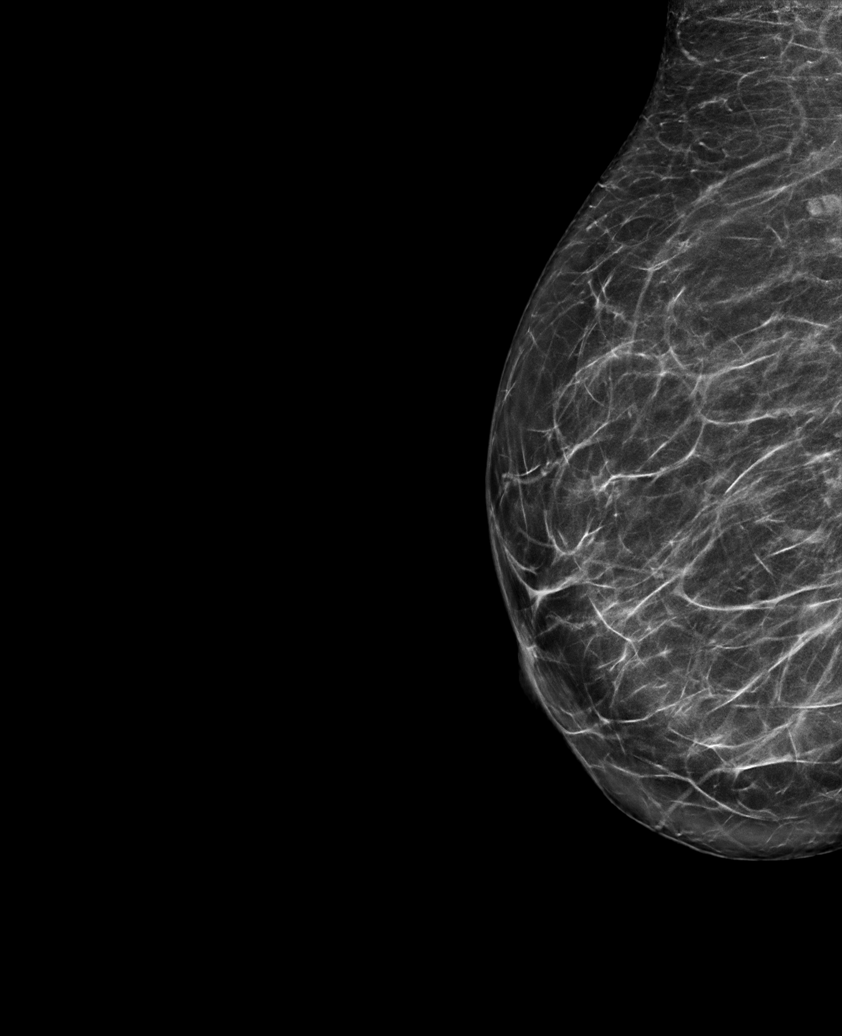

[R MLO synth-2D (2 of 2)]
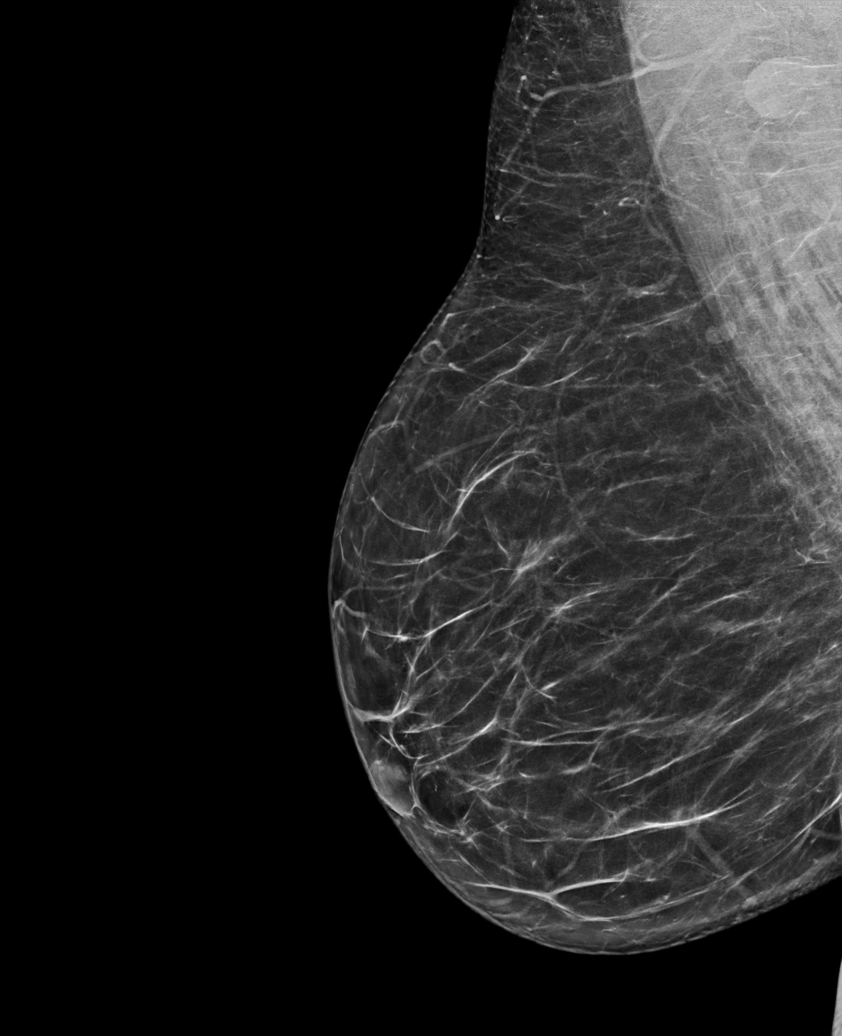

[L MLO synth-2D]
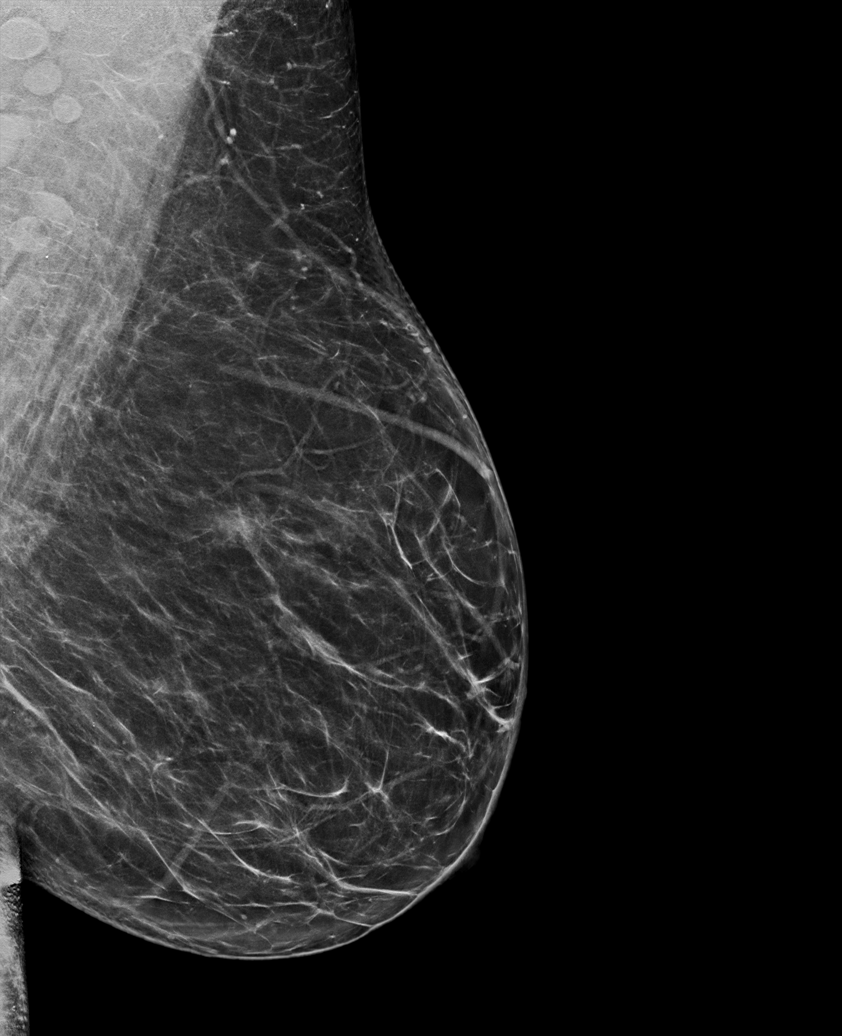

[R CC synth-2D]
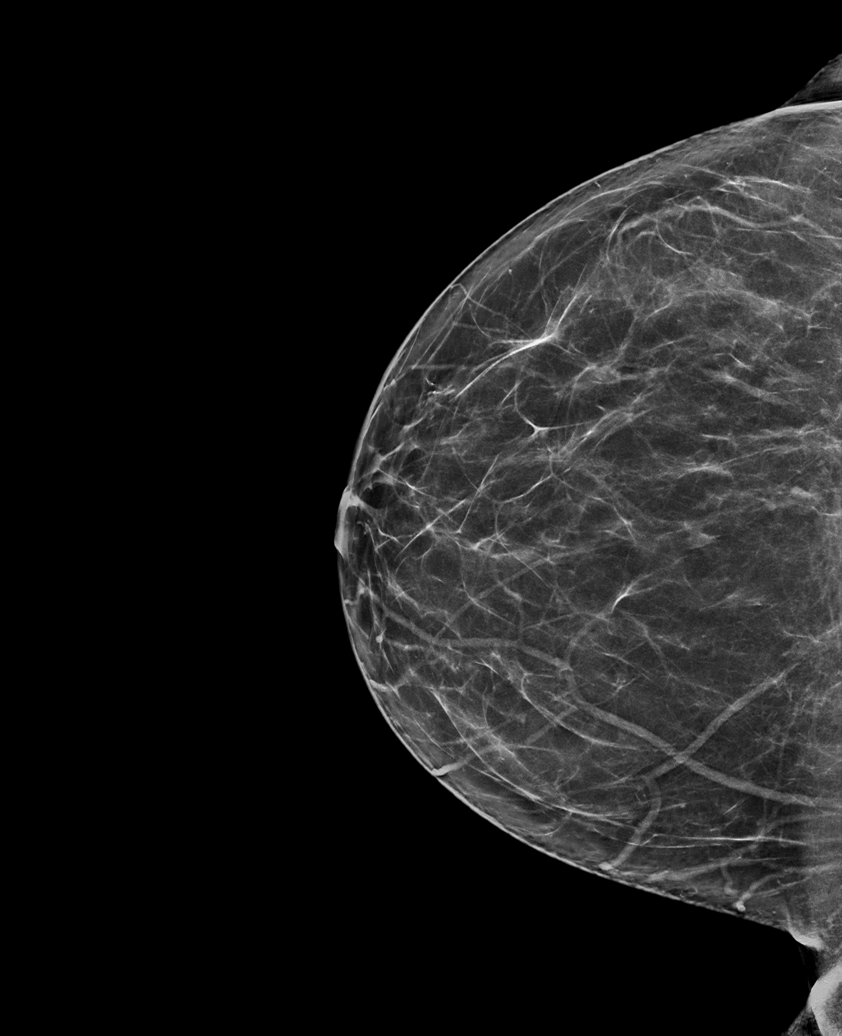

[L CC synth-2D]
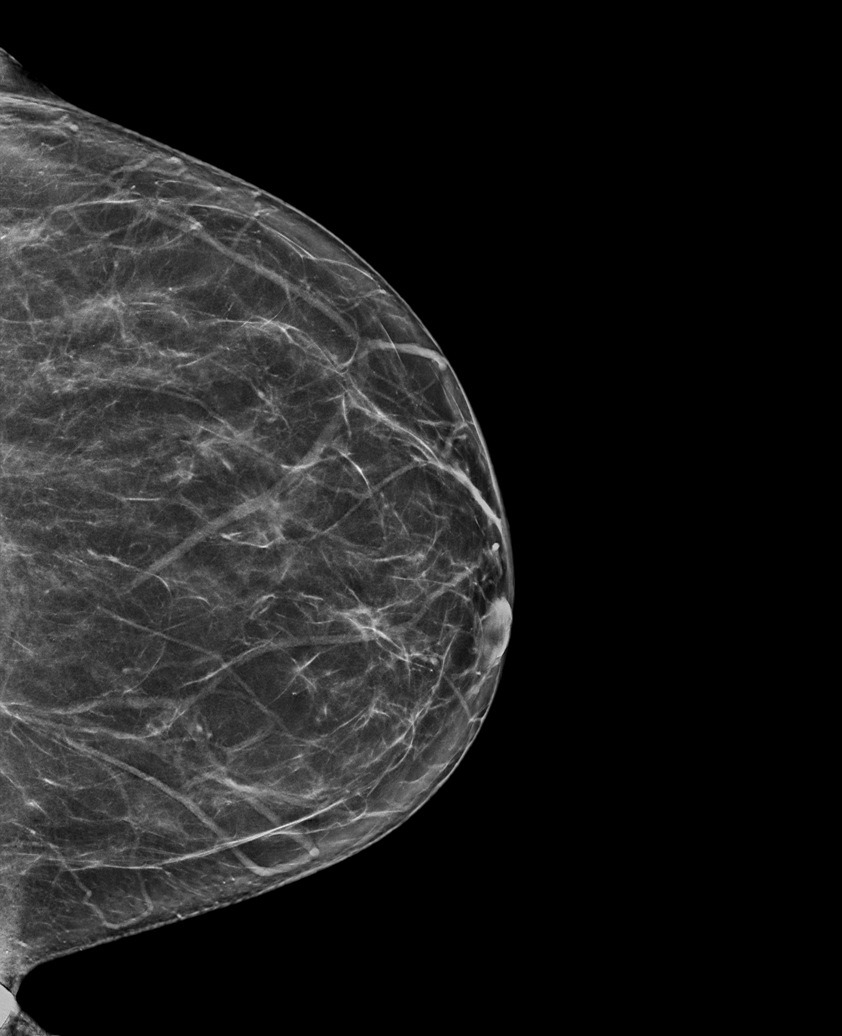

[R CC tomo · tomo slice 39/77.0]
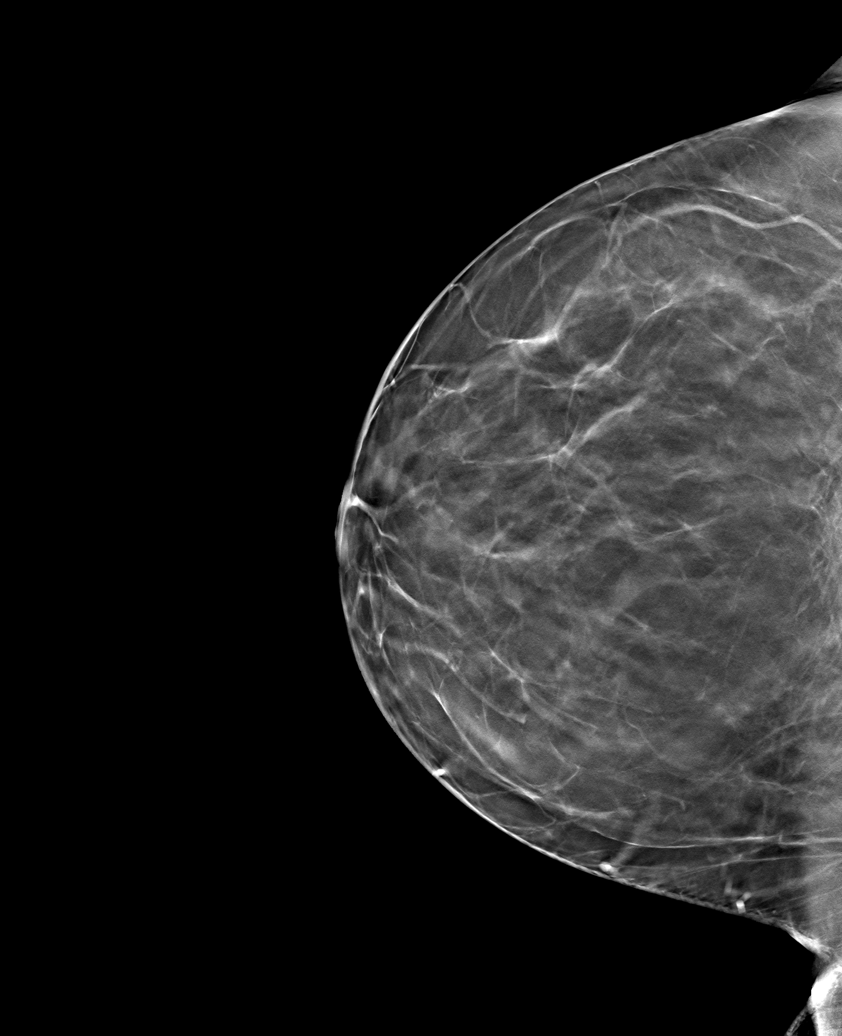

[6 of 30 positions shown; findings below may reference images not displayed]

ACR Breast Density Category b: There are scattered areas of
fibroglandular density.
FINDINGS: There are no findings suspicious for malignancy.
IMPRESSION: No mammographic evidence of malignancy. A result letter of this
screening mammogram will be mailed directly to the patient.

RECOMMENDATION:
Screening mammogram in one year. (Code:51-O-LD2)

BI-RADS CATEGORY  1: Negative.

## 2022-07-24 ENCOUNTER — Other Ambulatory Visit: Payer: Self-pay | Admitting: Obstetrics and Gynecology

## 2022-07-24 DIAGNOSIS — Z1231 Encounter for screening mammogram for malignant neoplasm of breast: Secondary | ICD-10-CM

## 2022-07-28 ENCOUNTER — Encounter (HOSPITAL_COMMUNITY): Payer: Self-pay | Admitting: Hematology

## 2022-08-04 ENCOUNTER — Encounter: Payer: Self-pay | Admitting: Urology

## 2022-08-04 ENCOUNTER — Ambulatory Visit (INDEPENDENT_AMBULATORY_CARE_PROVIDER_SITE_OTHER): Payer: Commercial Managed Care - PPO | Admitting: Urology

## 2022-08-04 VITALS — BP 140/86 | HR 83 | Ht 64.0 in | Wt 280.0 lb

## 2022-08-04 DIAGNOSIS — Z87442 Personal history of urinary calculi: Secondary | ICD-10-CM | POA: Diagnosis not present

## 2022-08-04 NOTE — Progress Notes (Signed)
Haze Rushing Plume,acting as a scribe for Hollice Espy, MD.,have documented all relevant documentation on the behalf of Hollice Espy, MD,as directed by  Hollice Espy, MD while in the presence of Hollice Espy, MD.  08/14/2022 9:39 AM   Tanya Henderson Aug 26, 1975 [id number]  Referring provider: Tracie Harrier, MD [address] [facility] [place],  [place] [address]  Chief Complaint  Patient presents with   Nephrolithiasis    HPI:  47 year-old female who presents today for further evaluation of kidney stones.   She was recently seen and evaluated in the emergency room in [facility] on 07/16/2022 with lower abdominal and some difficulty urinating. Urinalysis at the time showed many RBC's, but no evidence of infection. Her labs were stable without leukocytosis. She had a CT abdomen pelvis that showed a sub-centimeter stone at the level of the urethra. It was felt to be possibly reflective of an interval passing stone. They also notated non obstructing right sided nephrolithiasis. She had a CT on June 11, 2022 for comparison, which time this particular stone was not present.  Since her discharge, she has been experiencing occasional flank pain and a sensation of "pulsing" in her kidneys. She has also mentioned that her urinary symptoms have largely resolved.  PMH: Past Medical History:  Diagnosis Date   Abnormal LFTs    Anxiety    B12 deficiency    Bronchitis    Cholelithiasis 12/16/2013   Chronic kidney disease    Kidney stones   Depression 12/16/2013   GERD (gastroesophageal reflux disease) 12/16/2013   Headache    Hidradenitis    History of smoking    History of stroke    History of stroke 05/23/2021   Migraine 12/16/2013   Renal calculi    Vitamin D deficiency     Surgical History: Past Surgical History:  Procedure Laterality Date   CHOLECYSTECTOMY     CYSTOSCOPY W/ URETERAL STENT PLACEMENT Right 09/21/2015   Procedure: CYSTOSCOPY WITH  STENT REPLACEMENT;  Surgeon: Hollice Espy, MD;  Location: [facility];  Service: Urology;  Laterality: Right;   CYSTOSCOPY W/ URETERAL STENT REMOVAL Right 02/10/2015   Procedure: CYSTOSCOPY WITH STENT REMOVAL;  Surgeon: Collier Flowers, MD;  Location: [facility];  Service: Urology;  Laterality: Right;   CYSTOSCOPY WITH STENT PLACEMENT Right 01/29/2015   Procedure: CYSTOSCOPY WITH STENT PLACEMENT;  Surgeon: Ardis Hughs, MD;  Location: [facility];  Service: Urology;  Laterality: Right;   CYSTOSCOPY WITH STENT PLACEMENT Right 02/10/2015   Procedure: CYSTOSCOPY WITH STENT PLACEMENT;  Surgeon: Collier Flowers, MD;  Location: [facility];  Service: Urology;  Laterality: Right;   CYSTOSCOPY WITH STENT PLACEMENT Right 09/07/2015   Procedure: CYSTOSCOPY WITH STENT PLACEMENT;  Surgeon: Ardis Hughs, MD;  Location: [facility];  Service: Urology;  Laterality: Right;   CYSTOSCOPY/RETROGRADE/URETEROSCOPY/STONE EXTRACTION WITH BASKET  05/26/2015   Procedure: CYSTOSCOPY/RETROGRADE/URETEROSCOPY/STONE EXTRACTION WITH BASKET;  Surgeon: Hollice Espy, MD;  Location: [facility];  Service: Urology;;   CYSTOSCOPY/URETEROSCOPY/HOLMIUM LASER  05/26/2015   Procedure: CYSTOSCOPY/URETEROSCOPY/HOLMIUM LASER;  Surgeon: Hollice Espy, MD;  Location: [facility];  Service: Urology;;   EXTRACORPOREAL SHOCK WAVE LITHOTRIPSY Right 04/22/2015   Procedure: EXTRACORPOREAL SHOCK WAVE LITHOTRIPSY (ESWL);  Surgeon: Nickie Retort, MD;  Location: [facility];  Service: Urology;  Laterality: Right;   KIDNEY STONE SURGERY     LIVER BIOPSY  10/03/2021   OOPHORECTOMY Right 1996   Ovarian cyst   URETEROSCOPY WITH HOLMIUM LASER LITHOTRIPSY Right 02/10/2015   Procedure: URETEROSCOPY  WITH HOLMIUM LASER LITHOTRIPSY;  Surgeon: Collier Flowers, MD;  Location: [facility];  Service: Urology;  Laterality: Right;   URETEROSCOPY WITH HOLMIUM LASER LITHOTRIPSY Right 09/21/2015   Procedure: URETEROSCOPY WITH HOLMIUM LASER LITHOTRIPSY/ fragmentation and  removal;  Surgeon: Hollice Espy, MD;  Location: [facility];  Service: Urology;  Laterality: Right;    Home Medications:  Allergies as of 08/04/2022       Reactions   Clindamycin/lincomycin    Penicillins Other (See Comments)   Reaction:  Unknown         Medication List        Accurate as of August 04, 2022  9:39 AM. If you have any questions, ask your nurse or doctor.          STOP taking these medications    fluticasone 50 MCG/ACT nasal spray Commonly known as: FLONASE Stopped by: Hollice Espy, MD   ondansetron 4 MG tablet Commonly known as: ZOFRAN Stopped by: Hollice Espy, MD       TAKE these medications    Advanced Probiotic-14 Caps Take 1 capsule by mouth daily.   BIOTIN PO Take by mouth daily.   cetirizine 10 MG tablet Commonly known as: ZYRTEC Take 10 mg by mouth daily.   cholestyramine light 4 g packet Commonly known as: PREVALITE Take by mouth.   clobetasol ointment 0.05 % Commonly known as: TEMOVATE Apply 1 application  topically 2 (two) times daily.   doxepin 25 MG capsule Commonly known as: SINEQUAN Take 25 mg by mouth at bedtime.   iron polysaccharides 150 MG capsule Commonly known as: NIFEREX Take 150 mg by mouth daily.   losartan 50 MG tablet Commonly known as: COZAAR Take 50 mg by mouth daily. What changed: Another medication with the same name was removed. Continue taking this medication, and follow the directions you see here. Changed by: Hollice Espy, MD   Multi-Vitamin tablet Take 1 tablet by mouth daily.   omeprazole 20 MG capsule Commonly known as: PRILOSEC Take 1 capsule by mouth daily.   sertraline 25 MG tablet Commonly known as: ZOLOFT Take 1 tablet by mouth daily.   ursodiol 300 MG capsule Commonly known as: ACTIGALL Take 900 mg by mouth 2 (two) times daily. Take 3 capsule twice daily   VITAMIN D (CHOLECALCIFEROL) PO Take by mouth daily.        Allergies:  Allergies  Allergen Reactions    Clindamycin/Lincomycin    Penicillins Other (See Comments)    Reaction:  Unknown     Family History: Family History  Problem Relation Age of Onset   Lupus Mother    Hypertension Mother    Uterine cancer Mother    Diabetes Paternal Grandmother    Nephrolithiasis Paternal Grandfather    Diabetes Maternal Aunt    Breast cancer Neg Hx     Social History:  reports that she quit smoking about 3 years ago. Her smoking use included cigarettes. She has a 15.00 pack-year smoking history. She has never been exposed to tobacco smoke. She has never used smokeless tobacco. She reports current alcohol use. She reports that she does not use drugs.   Physical Exam: BP (!) 140/86 (BP Location: Left Arm, Patient Position: Sitting, Cuff Size: Large)   Pulse 83   Ht 5' 4"$  (1.626 m)   Wt 280 lb (127 kg)   BMI 48.06 kg/m   Constitutional:  Alert and oriented, No acute distress. HEENT: [place] AT, moist mucus membranes.  Trachea midline, no masses. Neurologic:  Grossly intact, no focal deficits, moving all 4 extremities. Psychiatric: Normal mood and affect.  Pertinent Imaging: Imaging not available at time this note was created. Will be reviewed once made available.   Impression  Nonobstructing right-sided nephrolithiasis. No evidence of obstructing stone as clinically questioned.  Indeterminate hypoattenuating lesion in hepatic segment IVB, for which outpatient MRI has already been recommended.  Multiple enlarged periportal lymph nodes, 1 of which has decreased in size from prior. ==================== ADDENDUM (07/17/2022 8:58 AM): On review, the following additional findings were noted:  Agree there is no stone seen within the kidney or collecting system to the level of the urinary bladder. However, there is a subcentimeter stone seen in the region of the urethra (2:154) which was not seen on prior examination which could reflect a passing stone in the setting of hematuria or  pain Narrative  EXAM: CT ABDOMEN PELVIS WO CONTRAST ACCESSION: [id number] UN CLINICAL INDICATION: 47 years old with lower abdominal pain, concern for kidney / bladder stone vs less likely pelvic pathology    COMPARISON: CT abdomen pelvis with contrast 06/11/2022  TECHNIQUE: A spiral CT scan was obtained without IV contrast from the lung bases to the pubic symphysis.  Images were reconstructed in the axial plane. Coronal and sagittal reformatted images were also provided for further evaluation.  Evaluation of the solid organs and vasculature is limited in the absence of intravenous contrast.   FINDINGS:  LOWER CHEST: Visualized lung bases are clear. No pleural or pericardial effusion.  LIVER: Normal liver contour. Ill-defined hypoattenuating lesion in hepatic segment IVB measuring up to 2.3 cm (2:35), unchanged from prior.  BILIARY: The gallbladder is surgically absent. No intrahepatic biliary ductal dilatation.  SPLEEN: Possible splenomegaly. The largest diameter is 14.1 cm. More refined spleen size assessment with volume calculation can be obtained for CT and MR by request from [facility] radiology 3D lab [email or web address]).  PANCREAS: Normal pancreatic contour without signs of inflammation or gross ductal dilatation.  ADRENAL GLANDS: Normal appearance of the adrenal glands.  KIDNEYS/URETERS: Smooth renal contours. Multiple nonobstructing right renal calculi the largest of which measures up to 0.5 cm (4:72). No left-sided nephrolithiasis. No ureteral dilatation or collecting system distention.  BLADDER: Unremarkable.  REPRODUCTIVE ORGANS: Anteverted uterus. No suspicious adnexal masses.  GI TRACT: The stomach appears unremarkable. The small bowel is normal in caliber. There is colonic diverticulosis without evidence of acute colonic pathology. Normal appendix.  PERITONEUM/RETROPERITONEUM AND MESENTERY: No free air. No ascites. No fluid collection.  VASCULATURE:  Normal caliber aorta. Otherwise, limited evaluation without contrast.  LYMPH NODES: Multiple enlarged periportal lymph nodes. For reference: -1.4 cm periportal lymph node (2:45), unchanged. -1.1 cm periportal lymph node (2:49), previously 1.4 cm.  BONES and SOFT TISSUES: Mild multilevel degenerative changes of the visualized thoracolumbar spine. No aggressive osseous lesions. Small fat-containing umbilical hernia. Procedure Note  Chandra Batch, DO - 07/17/2022 Formatting of this note might be different from the original. EXAM: CT ABDOMEN PELVIS WO CONTRAST ACCESSION: [id number] UN CLINICAL INDICATION: 47 years old with lower abdominal pain, concern for kidney / bladder stone vs less likely pelvic pathology    COMPARISON: CT abdomen pelvis with contrast 06/11/2022  TECHNIQUE: A spiral CT scan was obtained without IV contrast from the lung bases to the pubic symphysis.  Images were reconstructed in the axial plane. Coronal and sagittal reformatted images were also provided for further evaluation.  Evaluation of the solid organs and vasculature is limited in the absence of intravenous contrast.   FINDINGS:  LOWER CHEST:  Visualized lung bases are clear. No pleural or pericardial effusion.  LIVER: Normal liver contour. Ill-defined hypoattenuating lesion in hepatic segment IVB measuring up to 2.3 cm (2:35), unchanged from prior.  BILIARY: The gallbladder is surgically absent. No intrahepatic biliary ductal dilatation.  SPLEEN: Possible splenomegaly. The largest diameter is 14.1 cm. More refined spleen size assessment with volume calculation can be obtained for CT and MR by request from [facility] radiology 3D lab ([email or web address]).  PANCREAS: Normal pancreatic contour without signs of inflammation or gross ductal dilatation.  ADRENAL GLANDS: Normal appearance of the adrenal glands.  KIDNEYS/URETERS: Smooth renal contours. Multiple nonobstructing right renal  calculi the largest of which measures up to 0.5 cm (4:72). No left-sided nephrolithiasis. No ureteral dilatation or collecting system distention.  BLADDER: Unremarkable.  REPRODUCTIVE ORGANS: Anteverted uterus. No suspicious adnexal masses.  GI TRACT: The stomach appears unremarkable. The small bowel is normal in caliber. There is colonic diverticulosis without evidence of acute colonic pathology. Normal appendix.  PERITONEUM/RETROPERITONEUM AND MESENTERY: No free air. No ascites. No fluid collection.  VASCULATURE: Normal caliber aorta. Otherwise, limited evaluation without contrast.  LYMPH NODES: Multiple enlarged periportal lymph nodes. For reference: -1.4 cm periportal lymph node (2:45), unchanged. -1.1 cm periportal lymph node (2:49), previously 1.4 cm.  BONES and SOFT TISSUES: Mild multilevel degenerative changes of the visualized thoracolumbar spine. No aggressive osseous lesions. Small fat-containing umbilical hernia.  IMPRESSION: Nonobstructing right-sided nephrolithiasis. No evidence of obstructing stone as clinically questioned.  Indeterminate hypoattenuating lesion in hepatic segment IVB, for which outpatient MRI has already been recommended.  Multiple enlarged periportal lymph nodes, 1 of which has decreased in size from prior. ==================== ADDENDUM (07/17/2022 8:58 AM): On review, the following additional findings were noted:  Agree there is no stone seen within the kidney or collecting system to the level of the urinary bladder. However, there is a subcentimeter stone seen in the region of the urethra (2:154) which was not seen on prior examination which could reflect a passing stone in the setting of hematuria or pain  Assessment & Plan:    1. History of kidney stones - Likely interval passage of stone (at level of urethra on most recent CT) - She is having no further symptoms and her urinalysis had cleared when she followed up with her OBGYN provider.  - We  discussed general stone prevention techniques including drinking plenty water with goal of producing 2.5 L urine daily, increased citric acid intake, avoidance of high oxalate containing foods, and decreased salt intake.  Information about dietary recommendations given today.  - We will personally review images from [facility] to ensure that she has no significant residual stone burden.   Return if symptoms worsen or fail to improve.  I have reviewed the above documentation for accuracy and completeness, and I agree with the above.   Hollice Espy, MD   [facility] [address], [place],  [address] [phone]

## 2022-08-23 ENCOUNTER — Telehealth: Payer: Self-pay | Admitting: Urology

## 2022-08-23 DIAGNOSIS — R109 Unspecified abdominal pain: Secondary | ICD-10-CM

## 2022-08-23 DIAGNOSIS — Z87442 Personal history of urinary calculi: Secondary | ICD-10-CM

## 2022-08-23 NOTE — Progress Notes (Unsigned)
08/24/2022 4:45 PM   Tanya Henderson Oct 20, 1975 [id number]  Referring provider: Tracie Harrier, MD [address] [facility] [place],  [place] [address]  Urological history: 1. Nephrolithiasis -Stone composition of 55% calcium oxalate monohydrate, 25% calcium oxalate dihydrate and 20% calcium phosphate -right URS (2016)  -right URS (2017)  -CT (06/2022) - right urethra stone and multiple nonobstructing right renal calculi the largest of which measures up to 0.5 cm    No chief complaint on file.   HPI: Tanya Henderson is a 47 y.o. female who presents today for Pt called, pt feels like she has another kidney stone.  Has on/off back right side,pain, with chills.  UA ***  KUB ***   PMH: Past Medical History:  Diagnosis Date   Abnormal LFTs    Anxiety    B12 deficiency    Bronchitis    Cholelithiasis 12/16/2013   Chronic kidney disease    Kidney stones   Depression 12/16/2013   GERD (gastroesophageal reflux disease) 12/16/2013   Headache    Hidradenitis    History of smoking    History of stroke    History of stroke 05/23/2021   Migraine 12/16/2013   Renal calculi    Vitamin D deficiency     Surgical History: Past Surgical History:  Procedure Laterality Date   CHOLECYSTECTOMY     CYSTOSCOPY W/ URETERAL STENT PLACEMENT Right 09/21/2015   Procedure: CYSTOSCOPY WITH STENT REPLACEMENT;  Surgeon: Hollice Espy, MD;  Location: [facility];  Service: Urology;  Laterality: Right;   CYSTOSCOPY W/ URETERAL STENT REMOVAL Right 02/10/2015   Procedure: CYSTOSCOPY WITH STENT REMOVAL;  Surgeon: Collier Flowers, MD;  Location: [facility];  Service: Urology;  Laterality: Right;   CYSTOSCOPY WITH STENT PLACEMENT Right 01/29/2015   Procedure: CYSTOSCOPY WITH STENT PLACEMENT;  Surgeon: Ardis Hughs, MD;  Location: [facility];  Service: Urology;  Laterality: Right;   CYSTOSCOPY WITH STENT PLACEMENT Right 02/10/2015   Procedure: CYSTOSCOPY WITH STENT PLACEMENT;   Surgeon: Collier Flowers, MD;  Location: [facility];  Service: Urology;  Laterality: Right;   CYSTOSCOPY WITH STENT PLACEMENT Right 09/07/2015   Procedure: CYSTOSCOPY WITH STENT PLACEMENT;  Surgeon: Ardis Hughs, MD;  Location: [facility];  Service: Urology;  Laterality: Right;   CYSTOSCOPY/RETROGRADE/URETEROSCOPY/STONE EXTRACTION WITH BASKET  05/26/2015   Procedure: CYSTOSCOPY/RETROGRADE/URETEROSCOPY/STONE EXTRACTION WITH BASKET;  Surgeon: Hollice Espy, MD;  Location: [facility];  Service: Urology;;   CYSTOSCOPY/URETEROSCOPY/HOLMIUM LASER  05/26/2015   Procedure: CYSTOSCOPY/URETEROSCOPY/HOLMIUM LASER;  Surgeon: Hollice Espy, MD;  Location: [facility];  Service: Urology;;   EXTRACORPOREAL SHOCK WAVE LITHOTRIPSY Right 04/22/2015   Procedure: EXTRACORPOREAL SHOCK WAVE LITHOTRIPSY (ESWL);  Surgeon: Nickie Retort, MD;  Location: [facility];  Service: Urology;  Laterality: Right;   KIDNEY STONE SURGERY     LIVER BIOPSY  10/03/2021   OOPHORECTOMY Right 1996   Ovarian cyst   URETEROSCOPY WITH HOLMIUM LASER LITHOTRIPSY Right 02/10/2015   Procedure: URETEROSCOPY WITH HOLMIUM LASER LITHOTRIPSY;  Surgeon: Collier Flowers, MD;  Location: [facility];  Service: Urology;  Laterality: Right;   URETEROSCOPY WITH HOLMIUM LASER LITHOTRIPSY Right 09/21/2015   Procedure: URETEROSCOPY WITH HOLMIUM LASER LITHOTRIPSY/ fragmentation and removal;  Surgeon: Hollice Espy, MD;  Location: [facility];  Service: Urology;  Laterality: Right;    Home Medications:  Allergies as of 08/24/2022       Reactions   Clindamycin/lincomycin    Penicillins Other (See Comments)   Reaction:  Unknown  Medication List        Accurate as of August 23, 2022  4:45 PM. If you have any questions, ask your nurse or doctor.          Advanced Probiotic-14 Caps Take 1 capsule by mouth daily.   BIOTIN PO Take by mouth daily.   cetirizine 10 MG tablet Commonly known as: ZYRTEC Take 10 mg by mouth daily.   cholestyramine  light 4 g packet Commonly known as: PREVALITE Take by mouth.   clobetasol ointment 0.05 % Commonly known as: TEMOVATE Apply 1 application  topically 2 (two) times daily.   doxepin 25 MG capsule Commonly known as: SINEQUAN Take 25 mg by mouth at bedtime.   iron polysaccharides 150 MG capsule Commonly known as: NIFEREX Take 150 mg by mouth daily.   losartan 50 MG tablet Commonly known as: COZAAR Take 50 mg by mouth daily.   Multi-Vitamin tablet Take 1 tablet by mouth daily.   omeprazole 20 MG capsule Commonly known as: PRILOSEC Take 1 capsule by mouth daily.   sertraline 25 MG tablet Commonly known as: ZOLOFT Take 1 tablet by mouth daily.   ursodiol 300 MG capsule Commonly known as: ACTIGALL Take 900 mg by mouth 2 (two) times daily. Take 3 capsule twice daily   VITAMIN D (CHOLECALCIFEROL) PO Take by mouth daily.        Allergies:  Allergies  Allergen Reactions   Clindamycin/Lincomycin    Penicillins Other (See Comments)    Reaction:  Unknown     Family History: Family History  Problem Relation Age of Onset   Lupus Mother    Hypertension Mother    Uterine cancer Mother    Diabetes Paternal Grandmother    Nephrolithiasis Paternal Grandfather    Diabetes Maternal Aunt    Breast cancer Neg Hx     Social History:  reports that she quit smoking about 3 years ago. Her smoking use included cigarettes. She has a 15.00 pack-year smoking history. She has never been exposed to tobacco smoke. She has never used smokeless tobacco. She reports current alcohol use. She reports that she does not use drugs.  ROS: Pertinent ROS in HPI  Physical Exam: There were no vitals taken for this visit.  Constitutional:  Well nourished. Alert and oriented, No acute distress. HEENT: [place] AT, moist mucus membranes.  Trachea midline, no masses. Cardiovascular: No clubbing, cyanosis, or edema. Respiratory: Normal respiratory effort, no increased work of breathing. GU: No CVA  tenderness.  No bladder fullness or masses. Vulvovaginal atrophy w/ pallor, loss of rugae, introital retraction, excoriations.  Vulvar thinning, fusion of labia, clitoral hood retraction, prominent urethral meatus.   *** external genitalia, *** pubic hair distribution, no lesions.  Normal urethral meatus, no lesions, no prolapse, no discharge.   No urethral masses, tenderness and/or tenderness. No bladder fullness, tenderness or masses. *** vagina mucosa, *** estrogen effect, no discharge, no lesions, *** pelvic support, *** cystocele and *** rectocele noted.  No cervical motion tenderness.  Uterus is freely mobile and non-fixed.  No adnexal/parametria masses or tenderness noted.  Anus and perineum are without rashes or lesions.   ***  Neurologic: Grossly intact, no focal deficits, moving all 4 extremities. Psychiatric: Normal mood and affect.    Laboratory Data: Urinalysis N/A I have reviewed the labs.   Pertinent Imaging: *** I have independently reviewed the films.    Assessment & Plan:  ***  1. Right flank pain -UA ***  2. Nephrolithiasis *** No follow-ups  on file.  These notes generated with voice recognition software. I apologize for typographical errors.  [place], [place] [address]  [place], [place] [address] [phone]

## 2022-08-23 NOTE — Telephone Encounter (Signed)
Consulted with Zara Council and spoke with patient. 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 Patient aware she will need to give [place] a urine sample for UA and Culture.

## 2022-08-23 NOTE — Telephone Encounter (Signed)
Pt called, pt feels like she has another kidney stone.  Has on/off back right side,pain, with chills.  Was really this morning.  Can Dr. Erlene Quan prescribe something?.  Pt would like to have an Ultra sound to confirm it.  Please advise pt [phone].  Please leave detailed message,pt is at work.

## 2022-08-24 ENCOUNTER — Encounter: Payer: Self-pay | Admitting: Urology

## 2022-08-24 ENCOUNTER — Ambulatory Visit
Admission: RE | Admit: 2022-08-24 | Discharge: 2022-08-24 | Disposition: A | Discharge status: Home / Self Care | Payer: Commercial Managed Care - PPO | Source: Ambulatory Visit | Attending: Urology | Admitting: Urology

## 2022-08-24 ENCOUNTER — Ambulatory Visit
Admission: RE | Admit: 2022-08-24 | Discharge: 2022-08-24 | Disposition: A | Discharge status: Home / Self Care | Payer: Commercial Managed Care - PPO | Attending: Urology | Admitting: Urology

## 2022-08-24 ENCOUNTER — Ambulatory Visit (INDEPENDENT_AMBULATORY_CARE_PROVIDER_SITE_OTHER): Payer: Commercial Managed Care - PPO | Admitting: Urology

## 2022-08-24 VITALS — BP 164/93 | HR 76 | Temp 97.8°F | Ht 64.0 in | Wt 280.0 lb

## 2022-08-24 DIAGNOSIS — R109 Unspecified abdominal pain: Secondary | ICD-10-CM | POA: Diagnosis present

## 2022-08-24 DIAGNOSIS — N200 Calculus of kidney: Secondary | ICD-10-CM | POA: Diagnosis not present

## 2022-08-24 DIAGNOSIS — R3989 Other symptoms and signs involving the genitourinary system: Secondary | ICD-10-CM

## 2022-08-24 DIAGNOSIS — Z87442 Personal history of urinary calculi: Secondary | ICD-10-CM | POA: Insufficient documentation

## 2022-08-24 LAB — URINALYSIS, COMPLETE
Bilirubin, UA: NEGATIVE
Glucose, UA: NEGATIVE
Ketones, UA: NEGATIVE
Leukocytes,UA: NEGATIVE
Nitrite, UA: NEGATIVE
Protein,UA: NEGATIVE
Specific Gravity, UA: 1.02 (ref 1.005–1.030)
Urobilinogen, Ur: 0.2 mg/dL (ref 0.2–1.0)
pH, UA: 5 (ref 5.0–7.5)

## 2022-08-24 LAB — MICROSCOPIC EXAMINATION

## 2022-08-24 MED ORDER — SULFAMETHOXAZOLE-TRIMETHOPRIM 800-160 MG PO TABS: 1.0000 | ORAL_TABLET | Freq: Two times a day (BID) | ORAL | 0 refills | Status: DC

## 2022-08-25 ENCOUNTER — Encounter (HOSPITAL_COMMUNITY): Payer: Self-pay | Admitting: Hematology

## 2022-08-25 NOTE — Telephone Encounter (Signed)
Pt called in regarding her kidney stones. I did relay Larene [place] 3/8 message, pt still has questions on what to do about the stones.  Please call [phone]

## 2022-08-28 LAB — CULTURE, URINE COMPREHENSIVE

## 2022-08-30 ENCOUNTER — Other Ambulatory Visit: Payer: Self-pay | Admitting: *Deleted

## 2022-08-31 ENCOUNTER — Other Ambulatory Visit: Payer: Self-pay

## 2022-08-31 ENCOUNTER — Inpatient Hospital Stay: Payer: Commercial Managed Care - PPO | Attending: Hematology

## 2022-08-31 DIAGNOSIS — R911 Solitary pulmonary nodule: Secondary | ICD-10-CM | POA: Insufficient documentation

## 2022-08-31 DIAGNOSIS — E611 Iron deficiency: Secondary | ICD-10-CM | POA: Diagnosis present

## 2022-08-31 DIAGNOSIS — Z8673 Personal history of transient ischemic attack (TIA), and cerebral infarction without residual deficits: Secondary | ICD-10-CM | POA: Insufficient documentation

## 2022-08-31 DIAGNOSIS — N920 Excessive and frequent menstruation with regular cycle: Secondary | ICD-10-CM | POA: Insufficient documentation

## 2022-08-31 DIAGNOSIS — Z86711 Personal history of pulmonary embolism: Secondary | ICD-10-CM | POA: Insufficient documentation

## 2022-08-31 DIAGNOSIS — D509 Iron deficiency anemia, unspecified: Secondary | ICD-10-CM

## 2022-08-31 LAB — CBC WITH DIFFERENTIAL/PLATELET
Abs Immature Granulocytes: 0.02 10*3/uL (ref 0.00–0.07)
Basophils Absolute: 0.1 10*3/uL (ref 0.0–0.1)
Basophils Relative: 1 %
Eosinophils Absolute: 0.3 10*3/uL (ref 0.0–0.5)
Eosinophils Relative: 5 %
HCT: 44.2 % (ref 36.0–46.0)
Hemoglobin: 14.3 g/dL (ref 12.0–15.0)
Immature Granulocytes: 0 %
Lymphocytes Relative: 24 %
Lymphs Abs: 1.5 10*3/uL (ref 0.7–4.0)
MCH: 30.2 pg (ref 26.0–34.0)
MCHC: 32.4 g/dL (ref 30.0–36.0)
MCV: 93.2 fL (ref 80.0–100.0)
Monocytes Absolute: 0.5 10*3/uL (ref 0.1–1.0)
Monocytes Relative: 8 %
Neutro Abs: 3.8 10*3/uL (ref 1.7–7.7)
Neutrophils Relative %: 62 %
Platelets: 258 10*3/uL (ref 150–400)
RBC: 4.74 MIL/uL (ref 3.87–5.11)
RDW: 12.8 % (ref 11.5–15.5)
WBC: 6.3 10*3/uL (ref 4.0–10.5)
nRBC: 0 % (ref 0.0–0.2)

## 2022-08-31 LAB — IRON AND TIBC
Iron: 74 ug/dL (ref 28–170)
Saturation Ratios: 17 % (ref 10.4–31.8)
TIBC: 445 ug/dL (ref 250–450)
UIBC: 371 ug/dL

## 2022-08-31 LAB — FERRITIN: Ferritin: 43 ng/mL (ref 11–307)

## 2022-08-31 NOTE — Telephone Encounter (Signed)
Nori Riis, PA-C  You18 hours ago (4:52 PM)    Are you referring to your Xray results?  If so, the CT scan would of picked up any bowel obstruction and another word for phlebolith are "vein stones."  This are benign calcium deposits in your veins in the pelvis.  They do not cause any pain.  Constipation means you have to have a bowel movement.     Left message for patient to return call.

## 2022-09-07 ENCOUNTER — Inpatient Hospital Stay (HOSPITAL_BASED_OUTPATIENT_CLINIC_OR_DEPARTMENT_OTHER): Payer: Commercial Managed Care - PPO | Admitting: Hematology

## 2022-09-07 VITALS — BP 133/76 | HR 75 | Temp 98.0°F | Resp 18 | Ht 64.0 in | Wt 289.0 lb

## 2022-09-07 DIAGNOSIS — D509 Iron deficiency anemia, unspecified: Secondary | ICD-10-CM | POA: Diagnosis not present

## 2022-09-07 DIAGNOSIS — E611 Iron deficiency: Secondary | ICD-10-CM | POA: Diagnosis not present

## 2022-09-07 NOTE — Patient Instructions (Addendum)
[place] at [facility] Discharge Instructions   You were seen and examined today by Dr. Delton Coombes.  He reviewed the results of your lab work which are normal/stable.   Continue iron tablets. You may take on Mondays, Wednesdays, and Fridays.   Return as scheduled.    Thank you for choosing [place] at [facility] to provide your oncology and hematology care.  To afford each patient quality time with our provider, please arrive at least 15 minutes before your scheduled appointment time.   If you have a lab appointment with the [facility] please come in thru the Main Entrance and check in at the main information desk.  You need to re-schedule your appointment should you arrive 10 or more minutes late.  We strive to give you quality time with our providers, and arriving late affects you and other patients whose appointments are after yours.  Also, if you no show three or more times for appointments you may be dismissed from the clinic at the providers discretion.     Again, thank you for choosing [facility].  Our hope is that these requests will decrease the amount of time that you wait before being seen by our physicians.       _____________________________________________________________  Should you have questions after your visit to [facility], please contact our office at [phone] and follow the prompts.  Our office hours are 8:00 a.m. and 4:30 p.m. Monday - Friday.  Please note that voicemails left after 4:00 p.m. may not be returned until the following business day.  We are closed weekends and major holidays.  You do have access to a nurse 24-7, just call the main number to the clinic [phone] and do not press any options, hold on the line and a nurse will answer the phone.    For prescription refill requests, have your pharmacy contact our office and allow 72 hours.    Due to Covid, you will need  to wear a mask upon entering the hospital. If you do not have a mask, a mask will be given to you at the Main Entrance upon arrival. For doctor visits, patients may have 1 support person age 49 or older with them. For treatment visits, patients can not have anyone with them due to social distancing guidelines and our immunocompromised population.

## 2022-09-07 NOTE — Progress Notes (Signed)
[facility] [address], [place] [address]    Clinic Day:  09/07/2022  Referring physician: Tracie Harrier, MD  Patient Care Team: Tracie Harrier, MD as PCP - General (Internal Medicine) Sindy Guadeloupe, MD as Consulting Physician (Hematology and Oncology) Schermerhorn, Gwen Her, MD as Consulting Physician (Obstetrics and Gynecology) Wonda Cerise, PA-C as Referring Physician (Physician Assistant) Derek Jack, MD as Medical Oncologist (Hematology)   ASSESSMENT & PLAN:   Assessment: Iron deficiency state from bleeding: - Labs on 11/08/2021 with ferritin 3.5 and hemoglobin 8.8. - Tanya Henderson received INFeD on 11/17/[facility] at [facility].  Subsequent hemoglobin improved but her ferritin was low at 32. - Tanya Henderson is on iron tablets daily since May 2023.  Denies any ice pica.  No BRBPR.  Never had colonoscopy.  Tanya Henderson had blood transfusion in May 2023.  Menstrual bleeding is variable and occasionally heavy.    Social/family history: - Lives with her mother and son.  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  Quit smoking 3 years ago.  Smoked 1 pack/day for 15 years. - Maternal grandmother had DVT.  Mother had uterine cancer.  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  Serum IgG4 levels were normal. - Tanya Henderson has developed bleeding after liver biopsy.  4.  Ischemic stroke: - Presented with right-sided weakness.  PMH of hypertension, hyperlipidemia, obesity.  Hypercoagulable work-up showed normal protein C, protein S and ATIII.  [place] [address] they were normal.  IgM anticardiolipin antibody was elevated at 45.  LA and beta-2 glycoprotein 1 normal. - Tanya Henderson was evaluated by Dr. Janese Banks and repeat IgM anticardiolipin antibody normalized.  No  evidence for diagnosis of antiphospholipid antibody syndrome.    Plan: 1.  Iron deficiency state: - Tanya Henderson is taking iron tablet 2 times weekly as Tanya Henderson forgets sometimes. - Tanya Henderson has menstrual bleeding, moderate intensity every 7/28 days. - Tanya Henderson does not report any severe fatigue. - Labs on 08/31/2022 with ferritin 43 and hemoglobin 14.3. - Recommend that Tanya Henderson take iron tablet Monday, Wednesday and Friday. - RTC 6 months for follow-up with repeat labs.  If Tanya Henderson develops any severe fatigue, Tanya Henderson will call us for IV iron infusion.   2.  Elevated D-dimer: - Elevated D-dimer of 0.77 on 01/11/2022.  History of pulmonary embolism when Tanya Henderson was hospitalized at [facility], could not be anticoagulated secondary to bleeding issues at that time. - CT angiogram (02/01/2022): Negative for pulmonary embolism.  It showed increased caliber of the main pulmonary artery compatible with PAH.  6 mm lung nodule consistent with intrapulmonary lymph node, considered benign. - Echocardiogram (04/10/2022): LVEF 60 to 65%.  Right atrium and ventricle normal in size.  No indication of pulmonary arterial hypertension. - Tanya Henderson reported chest tightness in the upper chest lasting few minutes, occasionally with dizziness.  If the pain worsens/persists, Tanya Henderson was told to get evaluated with cardiology.    Orders Placed This Encounter  Procedures   CBC    Standing Status:   Future    Standing Expiration Date:   09/07/2023   Ferritin    Standing Status:   Future    Standing Expiration Date:   09/07/2023   Iron and TIBC ([facility] ONLY)    Standing Status:  Future    Standing Expiration Date:   09/07/2023      I,Alexis Herring,acting as a scribe for Derek Jack, MD.,have documented all relevant documentation on the behalf of Derek Jack, MD,as directed by  Derek Jack, MD while in the presence of Derek Jack, MD.   I, Derek Jack MD, have reviewed the above documentation  for accuracy and completeness, and I agree with the above.   Derek Jack, MD   3/21/20245:14 PM  CHIEF COMPLAINT:   Diagnosis: iron deficiency state, history of ischemic stroke    Cancer Staging  No matching staging information was found for the patient.   Prior Therapy: Venofer x3 from 01/11/2022 through 02/06/2022   Current Therapy:  po iron tablet   INTERVAL HISTORY:   Tanya Henderson is a 47 y.o. female presenting to clinic today for follow up of iron deficiency state, history of ischemic stroke. Tanya Henderson was last seen by me on 05/09/22.  Today, Tanya Henderson states that Tanya Henderson is doing okay overall. Her appetite level is at 100%. Her energy level is at 50%. Tanya Henderson states that Tanya Henderson has been taking po iron supplements 2-3 days a week. Tanya Henderson has a menstrual period every month lasting for x7 days at a time with light bleeding. Tanya Henderson states that her cycle may be irregular in frequency, at times Tanya Henderson could have her period 3 weeks late.  Tanya Henderson denies any rectal bleeding or hematuria. Tanya Henderson reports having pelvic/flank pain for several months, currently a 6/10 in severity, which Tanya Henderson attributes to her h/o kidney stones. Tanya Henderson is followed by OBGYN and urology for this problem. Tanya Henderson had a CT on 08/24/22 which revealed non obstructing bilateral nephrolithiasis, the largest being 88mm.  PAST MEDICAL HISTORY:   Past Medical History: Past Medical History:  Diagnosis Date   Abnormal LFTs    Anxiety    B12 deficiency    Bronchitis    Cholelithiasis 12/16/2013   Chronic kidney disease    Kidney stones   Depression 12/16/2013   GERD (gastroesophageal reflux disease) 12/16/2013   Headache    Hidradenitis    History of smoking    History of stroke    History of stroke 05/23/2021   Migraine 12/16/2013   Renal calculi    Vitamin D deficiency     Surgical History: Past Surgical History:  Procedure Laterality Date   CHOLECYSTECTOMY     CYSTOSCOPY W/ URETERAL STENT PLACEMENT Right 09/21/2015   Procedure: CYSTOSCOPY WITH  STENT REPLACEMENT;  Surgeon: Hollice Espy, MD;  Location: [facility];  Service: Urology;  Laterality: Right;   CYSTOSCOPY W/ URETERAL STENT REMOVAL Right 02/10/2015   Procedure: CYSTOSCOPY WITH STENT REMOVAL;  Surgeon: Collier Flowers, MD;  Location: [facility];  Service: Urology;  Laterality: Right;   CYSTOSCOPY WITH STENT PLACEMENT Right 01/29/2015   Procedure: CYSTOSCOPY WITH STENT PLACEMENT;  Surgeon: Ardis Hughs, MD;  Location: [facility];  Service: Urology;  Laterality: Right;   CYSTOSCOPY WITH STENT PLACEMENT Right 02/10/2015   Procedure: CYSTOSCOPY WITH STENT PLACEMENT;  Surgeon: Collier Flowers, MD;  Location: [facility];  Service: Urology;  Laterality: Right;   CYSTOSCOPY WITH STENT PLACEMENT Right 09/07/2015   Procedure: CYSTOSCOPY WITH STENT PLACEMENT;  Surgeon: Ardis Hughs, MD;  Location: [facility];  Service: Urology;  Laterality: Right;   CYSTOSCOPY/RETROGRADE/URETEROSCOPY/STONE EXTRACTION WITH BASKET  05/26/2015   Procedure: CYSTOSCOPY/RETROGRADE/URETEROSCOPY/STONE EXTRACTION WITH BASKET;  Surgeon: Hollice Espy, MD;  Location: [facility];  Service: Urology;;   CYSTOSCOPY/URETEROSCOPY/HOLMIUM LASER  05/26/2015   Procedure: CYSTOSCOPY/URETEROSCOPY/HOLMIUM LASER;  Surgeon: Hollice Espy, MD;  Location: [facility];  Service: Urology;;   EXTRACORPOREAL SHOCK WAVE LITHOTRIPSY Right 04/22/2015   Procedure: EXTRACORPOREAL SHOCK WAVE LITHOTRIPSY (ESWL);  Surgeon: Nickie Retort, MD;  Location: [facility];  Service: Urology;  Laterality: Right;   KIDNEY STONE SURGERY     LIVER BIOPSY  10/03/2021   OOPHORECTOMY Right 1996   Ovarian cyst   URETEROSCOPY WITH HOLMIUM LASER LITHOTRIPSY Right 02/10/2015   Procedure: URETEROSCOPY WITH HOLMIUM LASER LITHOTRIPSY;  Surgeon: Collier Flowers, MD;  Location: [facility];  Service: Urology;  Laterality: Right;   URETEROSCOPY WITH HOLMIUM LASER LITHOTRIPSY Right 09/21/2015   Procedure: URETEROSCOPY WITH HOLMIUM LASER LITHOTRIPSY/ fragmentation and  removal;  Surgeon: Hollice Espy, MD;  Location: [facility];  Service: Urology;  Laterality: Right;    Social History: Social History   Socioeconomic History   Marital status: Divorced    Spouse name: Not on file   Number of children: Not on file   Years of education: Not on file   Highest education level: Not on file  Occupational History   Not on file  Tobacco Use   Smoking status: Former    Packs/day: 1.00    Years: 15.00    Additional pack years: 0.00    Total pack years: 15.00    Types: Cigarettes    Quit date: 06/25/2019    Years since quitting: 3.2    Passive exposure: Never   Smokeless tobacco: Never  Vaping Use   Vaping Use: Never used  Substance and Sexual Activity   Alcohol use: Yes    Comment: rare   Drug use: No   Sexual activity: Yes  Other Topics Concern   Not on file  Social History Narrative   CNA at [employer]   One son- 69 yo.   Social Determinants of Health   Financial Resource Strain: Not on file  Food Insecurity: Not on file  Transportation Needs: Not on file  Physical Activity: Not on file  Stress: Not on file  Social Connections: Not on file  Intimate Partner Violence: Not on file    Family History: Family History  Problem Relation Age of Onset   Lupus Mother    Hypertension Mother    Uterine cancer Mother    Diabetes Paternal Grandmother    Nephrolithiasis Paternal Grandfather    Diabetes Maternal Aunt    Breast cancer Neg Hx     Current Medications:  Current Outpatient Medications:    cetirizine (ZYRTEC) 10 MG tablet, Take 10 mg by mouth daily., Disp: , Rfl:    cholestyramine light (PREVALITE) 4 g packet, Take by mouth., Disp: , Rfl:    clobetasol ointment (TEMOVATE) [id number] %, Apply 1 application  topically 2 (two) times daily., Disp: , Rfl:    losartan (COZAAR) 25 MG tablet, Take 25 mg by mouth daily., Disp: , Rfl:    Multiple Vitamin (MULTI-VITAMIN) tablet, Take 1 tablet by mouth daily., Disp: , Rfl:    omeprazole (PRILOSEC)  20 MG capsule, Take 1 capsule by mouth daily., Disp: , Rfl:    Probiotic Product (ADVANCED PROBIOTIC-14) CAPS, Take 1 capsule by mouth daily., Disp: , Rfl:    sertraline (ZOLOFT) 25 MG tablet, Take 1 tablet by mouth daily., Disp: , Rfl:    ursodiol (ACTIGALL) 300 MG capsule, Take 900 mg by mouth 2 (two) times daily. Take 3 capsule twice daily, Disp: , Rfl:    VITAMIN D, CHOLECALCIFEROL, PO, Take by mouth daily., Disp: , Rfl:  Allergies: Allergies  Allergen Reactions   Clindamycin/Lincomycin    Penicillins Other (See Comments)    Reaction:  Unknown     REVIEW OF SYSTEMS:   Review of Systems  Constitutional:  Negative for chills, fatigue and fever.  HENT:   Negative for lump/mass, mouth sores, nosebleeds, sore throat and trouble swallowing.   Eyes:  Negative for eye problems.  Respiratory:  Positive for cough and shortness of breath.   Cardiovascular:  Negative for chest pain, leg swelling and palpitations.  Gastrointestinal:  Positive for abdominal pain. Negative for blood in stool, constipation, diarrhea, nausea and vomiting.  Genitourinary:  Positive for pelvic pain. Negative for bladder incontinence, difficulty urinating, dysuria, frequency, hematuria, nocturia and vaginal bleeding.   Musculoskeletal:  Positive for back pain and flank pain. Negative for arthralgias, myalgias and neck pain.  Skin:  Negative for itching and rash.  Neurological:  Negative for dizziness, headaches and numbness.  Hematological:  Does not bruise/bleed easily.  Psychiatric/Behavioral:  Positive for sleep disturbance. Negative for depression and suicidal ideas. The patient is nervous/anxious.   All other systems reviewed and are negative.    VITALS:   Blood pressure 133/76, pulse 75, temperature 98 F (36.7 C), temperature source Oral, resp. rate 18, height 5\' 4"  (1.626 m), weight 289 lb (131.1 kg), last menstrual period 08/10/2022, SpO2 97 %.  Wt Readings from Last 3 Encounters:  09/07/22 289 lb  (131.1 kg)  08/24/22 280 lb (127 kg)  08/04/22 280 lb (127 kg)    Body mass index is 49.61 kg/m.  PHYSICAL EXAM:   Physical Exam Vitals and nursing note reviewed. Exam conducted with a chaperone present.  Constitutional:      Appearance: Normal appearance.  Cardiovascular:     Rate and Rhythm: Normal rate and regular rhythm.     Pulses: Normal pulses.     Heart sounds: Normal heart sounds.  Pulmonary:     Effort: Pulmonary effort is normal.     Breath sounds: Normal breath sounds.  Abdominal:     Palpations: Abdomen is soft. There is no hepatomegaly, splenomegaly or mass.     Tenderness: There is no abdominal tenderness.  Musculoskeletal:     Right lower leg: No edema.     Left lower leg: No edema.  Lymphadenopathy:     Cervical: No cervical adenopathy.     Right cervical: No superficial, deep or posterior cervical adenopathy.    Left cervical: No superficial, deep or posterior cervical adenopathy.     Upper Body:     Right upper body: No supraclavicular or axillary adenopathy.     Left upper body: No supraclavicular or axillary adenopathy.  Neurological:     General: No focal deficit present.     Mental Status: Tanya Henderson is alert and oriented to person, place, and time.  Psychiatric:        Mood and Affect: Mood normal.        Behavior: Behavior normal.     LABS:      Latest Ref Rng & Units 08/31/2022   10:15 AM 04/10/2022    9:11 AM 10/08/2021    8:02 PM  CBC  WBC 4.0 - 10.5 K/uL 6.3  6.7  10.3   Hemoglobin 12.0 - 15.0 g/dL 14.3  14.8  13.8   Hematocrit 36.0 - 46.0 % 44.2  46.0  44.2   Platelets 150 - 400 K/uL 258  251  300       Latest Ref Rng & Units 10/08/2021  8:02 PM 08/16/2021   10:16 AM 06/09/2021    5:28 PM  CMP  Glucose 70 - 99 mg/dL 104  109  123   BUN 6 - 20 mg/dL 16  14  9    Creatinine 0.44 - 1.00 mg/dL 0.73  0.69  0.72   Sodium 135 - 145 mmol/L 136  135  134   Potassium 3.5 - 5.1 mmol/L 5.3  3.7  4.0   Chloride 98 - 111 mmol/L 100  102  100    CO2 22 - 32 mmol/L 25  25  21    Calcium 8.9 - 10.3 mg/dL 10.9  9.0  8.5   Total Protein 6.5 - 8.1 g/dL 8.2   7.6   Total Bilirubin 0.3 - 1.2 mg/dL 2.6   1.5   Alkaline Phos 38 - 126 U/L 295   486   AST 15 - 41 U/L 164   256   ALT 0 - 44 U/L 140   334      No results found for: "CEA1", "CEA" / No results found for: "CEA1", "CEA" No results found for: "PSA1" No results found for: "[id number]" No results found for: "CAN125"  No results found for: "TOTALPROTELP", "ALBUMINELP", "A1GS", "A2GS", "BETS", "BETA2SER", "GAMS", "MSPIKE", "SPEI" Lab Results  Component Value Date   TIBC 445 08/31/2022   TIBC 385 04/10/2022   FERRITIN 43 08/31/2022   FERRITIN 57 04/10/2022   IRONPCTSAT 17 08/31/2022   IRONPCTSAT 35 (H) 04/10/2022   No results found for: "LDH"   STUDIES:   Abdomen 1 view (KUB)  Result Date: 08/26/2022 CLINICAL DATA:  Right flank pain.  Chills. EXAM: ABDOMEN - 1 VIEW COMPARISON:  CT renal stone same day; CT abdomen pelvis 10/08/2021 FINDINGS: 3 mm calcific density projecting in the expected location of the right kidney. Lung bases are clear. Cholecystectomy clips. Stool throughout the colon. Paucity of small bowel gas. Probable phlebolith left hemipelvis. Lumbar spine degenerative changes. IMPRESSION: 1. 3 mm calcific density projecting in the expected location of the right kidney, likely small stone. 2. Stool throughout the colon as can be seen with constipation. Electronically Signed   By: Lovey Newcomer M.D.   On: 08/26/2022 08:47   CT RENAL STONE STUDY  Result Date: 08/24/2022 CLINICAL DATA:  Abdominal/flank pain EXAM: CT ABDOMEN AND PELVIS WITHOUT CONTRAST TECHNIQUE: Multidetector CT imaging of the abdomen and pelvis was performed following the standard protocol without IV contrast. RADIATION DOSE REDUCTION: This exam was performed according to the departmental dose-optimization program which includes automated exposure control, adjustment of the mA and/or kV according to patient  size and/or use of iterative reconstruction technique. COMPARISON:  CT scan of the abdomen/pelvis 10/08/2021 FINDINGS: Lower chest: No acute abnormality. Hepatobiliary: No focal liver abnormality is seen. Status post cholecystectomy. No biliary dilatation. Pancreas: Unremarkable. No pancreatic ductal dilatation or surrounding inflammatory changes. Spleen: Normal in size without focal abnormality. Adrenals/Urinary Tract: Normal adrenal glands. Tiny punctate stones visible in the left renal collecting system on the coronal reformatted images. Several small punctate echogenic foci are present in the right kidney consistent with nephrolithiasis. 3 mm stone in the posterior interpolar collecting system and 6 mm stone in the anterior lower pole collecting system. No hydronephrosis. The ureters are unremarkable. The bladder is decompressed. Stomach/Bowel: No evidence of obstruction or focal bowel wall thickening. Normal appendix in the right lower quadrant. The terminal ileum is unremarkable. Vascular/Lymphatic: Limited evaluation in the absence of intravenous contrast. No aneurysm. No suspicious lymphadenopathy. Reproductive: Surgical changes along  the right aspect of the uterus suggest prior salpingectomy. Otherwise, unremarkable. Other: No abdominal wall hernia.  No ascites. Musculoskeletal: No acute fracture or aggressive appearing lytic or blastic osseous lesion. IMPRESSION: 1. Nonobstructing bilateral nephrolithiasis. The stone burden is slightly larger on the right than the left. Largest individual stone measures 6 mm in the right lower pole collecting system. 2. Otherwise, no acute abnormality within the abdomen or pelvis. Electronically Signed   By: Jacqulynn Cadet M.D.   On: 08/24/2022 09:34

## 2022-09-14 ENCOUNTER — Ambulatory Visit
Admission: RE | Admit: 2022-09-14 | Discharge: 2022-09-14 | Disposition: A | Discharge status: Home / Self Care | Payer: Commercial Managed Care - PPO | Source: Ambulatory Visit | Attending: Obstetrics and Gynecology | Admitting: Obstetrics and Gynecology

## 2022-09-14 DIAGNOSIS — Z1231 Encounter for screening mammogram for malignant neoplasm of breast: Secondary | ICD-10-CM | POA: Diagnosis not present

## 2023-03-02 ENCOUNTER — Inpatient Hospital Stay: Payer: Commercial Managed Care - PPO

## 2023-03-02 ENCOUNTER — Inpatient Hospital Stay: Payer: Commercial Managed Care - PPO | Attending: Hematology

## 2023-03-02 ENCOUNTER — Encounter (HOSPITAL_COMMUNITY): Payer: Self-pay | Admitting: Hematology

## 2023-03-02 DIAGNOSIS — E611 Iron deficiency: Secondary | ICD-10-CM | POA: Diagnosis present

## 2023-03-02 DIAGNOSIS — D509 Iron deficiency anemia, unspecified: Secondary | ICD-10-CM

## 2023-03-02 LAB — CBC
HCT: 40.3 % (ref 36.0–46.0)
Hemoglobin: 13.3 g/dL (ref 12.0–15.0)
MCH: 30.5 pg (ref 26.0–34.0)
MCHC: 33 g/dL (ref 30.0–36.0)
MCV: 92.4 fL (ref 80.0–100.0)
Platelets: 259 10*3/uL (ref 150–400)
RBC: 4.36 MIL/uL (ref 3.87–5.11)
RDW: 13.1 % (ref 11.5–15.5)
WBC: 7.5 10*3/uL (ref 4.0–10.5)
nRBC: 0 % (ref 0.0–0.2)

## 2023-03-02 LAB — IRON AND TIBC
Iron: 76 ug/dL (ref 28–170)
Saturation Ratios: 18 % (ref 10.4–31.8)
TIBC: 416 ug/dL (ref 250–450)
UIBC: 340 ug/dL

## 2023-03-02 LAB — FERRITIN: Ferritin: 24 ng/mL (ref 11–307)

## 2023-03-03 ENCOUNTER — Encounter (HOSPITAL_COMMUNITY): Payer: Self-pay | Admitting: Hematology

## 2023-03-07 NOTE — Progress Notes (Unsigned)
[facility] [address] [place] [address]    Clinic Day:  03/07/2023  Referring physician: Barbette Reichmann, MD  Patient Care Team: Barbette Reichmann, MD as PCP - General (Internal Medicine) Creig Hines, MD as Consulting Physician (Hematology and Oncology) Schermerhorn, Ihor Austin, MD as Consulting Physician (Obstetrics and Gynecology) Bridgette Habermann, PA-C as Referring Physician (Physician Assistant) Doreatha Massed, MD as Medical Oncologist (Hematology)   ASSESSMENT & PLAN:   Assessment: Iron deficiency state from bleeding: - Labs on 11/08/2021 with ferritin 3.5 and hemoglobin 8.8. - She received INFeD on 11/17/2021 at [facility].  Subsequent hemoglobin improved but her ferritin was low at 32. - She is on iron tablets daily since May 2023.  Denies any ice pica.  No BRBPR.  Never had colonoscopy.  She had blood transfusion in May 2023.  Menstrual bleeding is variable and occasionally heavy.  Social/family history: - Lives with her mother and son.  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  Quit smoking 3 years ago.  Smoked 1 pack/day for 15 years. - Maternal grandmother had DVT.  Mother had uterine cancer.  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  Serum IgG4 levels were normal. - She has developed bleeding after liver biopsy.  4.  Ischemic stroke: - Presented with right-sided weakness.  PMH of hypertension, hyperlipidemia, obesity.  Hypercoagulable work-up showed normal protein C, protein S and ATIII.  [place] [address] they were normal.  IgM anticardiolipin antibody was elevated at 45.  LA and beta-2 glycoprotein 1 normal. - She was evaluated by Dr. Smith Robert and repeat IgM anticardiolipin antibody normalized.  No  evidence for diagnosis of antiphospholipid antibody syndrome.    Plan: 1.  Iron deficiency state: - Last received IV Venofer on 01/11/2022, 01/26/2022 and 02/06/2022. -Not currently taking oral iron because she was told to stop. -Menstrual cycles have improved and are very light. -Reports severe fatigue. - Lab work from 03/02/2023 shows a hemoglobin of 13.3 (14.3) and, ferritin 24 (43) and iron saturation is 18 (17) %. -Recommend 3 doses of IV Venofer over the next few weeks. -Return to clinic in 6 months for follow-up with repeat lab work and virtual visit.   2.  Elevated D-dimer: - Elevated D-dimer of 0.77 on 01/11/2022.  History of pulmonary embolism when she was hospitalized at [facility], could not be anticoagulated secondary to bleeding issues at that time. - CT angiogram (02/01/2022): Negative for pulmonary embolism.  It showed increased caliber of the main pulmonary artery compatible with PAH.  6 mm lung nodule consistent with intrapulmonary lymph node, considered benign. - Echocardiogram (04/10/2022): LVEF 60 to 65%.  Right atrium and ventricle normal in size.  No indication of pulmonary arterial hypertension. - She reported chest tightness in the upper chest lasting few minutes, occasionally with dizziness.  If the pain worsens/persists, she was told to get evaluated with cardiology.  3.  Primary biliary cirrhosis- -Followed by [facility] Dr. Waynetta Sandy. -Referred for elevated liver enzymes and periportal/portocaval lymphadenopathy. 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  -She is currently on Ursodiol and cholestyramine.    PLAN SUMMARY: >> Recommend 3 doses of IV venofer.  >>  Stop Oral Iron.  >> Return to clinic in 6 months for follow-up with labs (cbc/d, iron panel, ferritin) a few days before and virtual visit.     No orders of the defined types were placed in this encounter.   I spent 25  minutes dedicated to the care of this patient (face-to-face and non-face-to-face) on the date of the encounter to include what is described in the assessment and plan.    [name], NP   9/18/20248:39 PM  CHIEF COMPLAINT:   Diagnosis: iron deficiency state, history of ischemic stroke    Cancer Staging  No matching staging information was found for the patient.    Prior Therapy: Venofer x3 from 01/11/2022 through 02/06/2022   Current Therapy: Intermittent IV iron  INTERVAL HISTORY:   Tanya Henderson is a 47 y.o. female presenting to clinic today for follow up of iron deficiency state, history of ischemic stroke. She was last seen by Dr. Ellin Saba on 09/07/2022.  In the interim, she denies any recent hospitalizations, surgeries or changes in baseline health.  She is followed closely by [facility] for primary biliary cirrhosis with Dr. Waynetta Sandy.  Reports she is on a ton of medications.  Reports this is causing her intermittent abdominal pain as well as severe fatigue.   She continues to have menstrual cycles every month lasting about 7 days at a time with light bleeding.  Continues to have irregular frequency of her cycles.  Reports she stopped taking iron tablets because somebody told her she did not need to take them.   She denies any rectal bleeding or hematuria.  PAST MEDICAL HISTORY:   Past Medical History: Past Medical History:  Diagnosis Date   Abnormal LFTs    Anxiety    B12 deficiency    Bronchitis    Cholelithiasis 12/16/2013   Chronic kidney disease    Kidney stones   Depression 12/16/2013   GERD (gastroesophageal reflux disease) 12/16/2013   Headache    Hidradenitis    History of smoking    History of stroke    History of stroke 05/23/2021   Migraine 12/16/2013   Renal calculi    Vitamin D deficiency     Surgical History: Past Surgical History:  Procedure Laterality Date   CHOLECYSTECTOMY     CYSTOSCOPY W/ URETERAL STENT PLACEMENT Right 09/21/2015    Procedure: CYSTOSCOPY WITH STENT REPLACEMENT;  Surgeon: Vanna Scotland, MD;  Location: [facility];  Service: Urology;  Laterality: Right;   CYSTOSCOPY W/ URETERAL STENT REMOVAL Right 02/10/2015   Procedure: CYSTOSCOPY WITH STENT REMOVAL;  Surgeon: Lorraine Lax, MD;  Location: [facility];  Service: Urology;  Laterality: Right;   CYSTOSCOPY WITH STENT PLACEMENT Right 01/29/2015   Procedure: CYSTOSCOPY WITH STENT PLACEMENT;  Surgeon: Crist Fat, MD;  Location: [facility];  Service: Urology;  Laterality: Right;   CYSTOSCOPY WITH STENT PLACEMENT Right 02/10/2015   Procedure: CYSTOSCOPY WITH STENT PLACEMENT;  Surgeon: Lorraine Lax, MD;  Location: [facility];  Service: Urology;  Laterality: Right;   CYSTOSCOPY WITH STENT PLACEMENT Right 09/07/2015   Procedure: CYSTOSCOPY WITH STENT PLACEMENT;  Surgeon: Crist Fat, MD;  Location: [facility];  Service: Urology;  Laterality: Right;   CYSTOSCOPY/RETROGRADE/URETEROSCOPY/STONE EXTRACTION WITH BASKET  05/26/2015   Procedure: CYSTOSCOPY/RETROGRADE/URETEROSCOPY/STONE EXTRACTION WITH BASKET;  Surgeon: Vanna Scotland, MD;  Location: [facility];  Service: Urology;;   CYSTOSCOPY/URETEROSCOPY/HOLMIUM LASER  05/26/2015   Procedure: CYSTOSCOPY/URETEROSCOPY/HOLMIUM LASER;  Surgeon: Vanna Scotland, MD;  Location: [facility];  Service: Urology;;   EXTRACORPOREAL  SHOCK WAVE LITHOTRIPSY Right 04/22/2015   Procedure: EXTRACORPOREAL SHOCK WAVE LITHOTRIPSY (ESWL);  Surgeon: Hildred Laser, MD;  Location: [facility];  Service: Urology;  Laterality: Right;   KIDNEY STONE SURGERY     LIVER BIOPSY  10/03/2021   OOPHORECTOMY Right 1996   Ovarian cyst   URETEROSCOPY WITH HOLMIUM LASER LITHOTRIPSY Right 02/10/2015   Procedure: URETEROSCOPY WITH HOLMIUM LASER LITHOTRIPSY;  Surgeon: Lorraine Lax, MD;  Location: [facility];  Service: Urology;  Laterality: Right;   URETEROSCOPY WITH HOLMIUM LASER LITHOTRIPSY Right 09/21/2015   Procedure: URETEROSCOPY WITH HOLMIUM LASER  LITHOTRIPSY/ fragmentation and removal;  Surgeon: Vanna Scotland, MD;  Location: [facility];  Service: Urology;  Laterality: Right;    Social History: Social History   Socioeconomic History   Marital status: Divorced    Spouse name: Not on file   Number of children: Not on file   Years of education: Not on file   Highest education level: Not on file  Occupational History   Not on file  Tobacco Use   Smoking status: Former    Current packs/day: 0.00    Average packs/day: 1 pack/day for 15.0 years (15.0 ttl pk-yrs)    Types: Cigarettes    Start date: 06/24/2004    Quit date: 06/25/2019    Years since quitting: 3.7    Passive exposure: Never   Smokeless tobacco: Never  Vaping Use   Vaping status: Never Used  Substance and Sexual Activity   Alcohol use: Yes    Comment: rare   Drug use: No   Sexual activity: Yes  Other Topics Concern   Not on file  Social History Narrative   CNA at [employer]   One son- 48 yo.   Social Determinants of Health   Financial Resource Strain: Low Risk  (11/27/2022)   Received from [facility]   Overall Financial Resource Strain (CARDIA)    Difficulty of Paying Living Expenses: Not hard at all  Food Insecurity: No Food Insecurity (11/27/2022)   Received from [facility]   Hunger Vital Sign    Worried About Running Out of Food in the Last Year: Never true    Ran Out of Food in the Last Year: Never true  Transportation Needs: No Transportation Needs (11/27/2022)   Received from [facility] - Transportation    In the past 12 months, has lack of transportation kept you from medical appointments or from getting medications?: No    Lack of Transportation (Non-Medical): No  Physical Activity: Not on file  Stress: Not on file  Social Connections: Not on file  Intimate Partner Violence: Not on file    Family History: Family History  Problem Relation Age of Onset   Lupus Mother     Hypertension Mother    Uterine cancer Mother    Diabetes Paternal Grandmother    Nephrolithiasis Paternal Grandfather    Diabetes Maternal Aunt    Breast cancer Neg Hx     Current Medications:  Current Outpatient Medications:    cetirizine (ZYRTEC) 10 MG tablet, Take 10 mg by mouth daily., Disp: , Rfl:    cholestyramine light (PREVALITE) 4 g packet, Take by mouth., Disp: , Rfl:    clobetasol ointment (TEMOVATE) 0.05 %, Apply 1 application  topically 2 (two) times daily., Disp: , Rfl:    losartan (COZAAR) 25 MG tablet, Take 25 mg by mouth daily., Disp: , Rfl:    Multiple Vitamin (MULTI-VITAMIN)  tablet, Take 1 tablet by mouth daily., Disp: , Rfl:    omeprazole (PRILOSEC) 20 MG capsule, Take 1 capsule by mouth daily., Disp: , Rfl:    Probiotic Product (ADVANCED PROBIOTIC-14) CAPS, Take 1 capsule by mouth daily., Disp: , Rfl:    sertraline (ZOLOFT) 25 MG tablet, Take 1 tablet by mouth daily., Disp: , Rfl:    VITAMIN D, CHOLECALCIFEROL, PO, Take by mouth daily., Disp: , Rfl:    Allergies: Allergies  Allergen Reactions   Clindamycin/Lincomycin    Penicillins Other (See Comments)    Reaction:  Unknown     REVIEW OF SYSTEMS:   Review of Systems  Constitutional:  Positive for fatigue.  Gastrointestinal:  Positive for abdominal pain (At times) and nausea. Negative for diarrhea and vomiting.  Hematological:  Negative for adenopathy.  Psychiatric/Behavioral:  Positive for sleep disturbance. The patient is nervous/anxious.      VITALS:   There were no vitals taken for this visit.  Wt Readings from Last 3 Encounters:  09/07/22 289 lb (131.1 kg)  08/24/22 280 lb (127 kg)  08/04/22 280 lb (127 kg)    There is no height or weight on file to calculate BMI.  PHYSICAL EXAM:   Physical Exam Vitals reviewed.  Constitutional:      Appearance: Normal appearance. She is obese.  Cardiovascular:     Rate and Rhythm: Normal rate and regular rhythm.  Pulmonary:     Effort: Pulmonary  effort is normal.     Breath sounds: Normal breath sounds.  Abdominal:     General: Bowel sounds are normal.     Palpations: Abdomen is soft.  Musculoskeletal:        General: No swelling. Normal range of motion.  Neurological:     Mental Status: She is alert and oriented to person, place, and time. Mental status is at baseline.     LABS:      Latest Ref Rng & Units 03/02/2023    7:57 AM 08/31/2022   10:15 AM 04/10/2022    9:11 AM  CBC  WBC 4.0 - 10.5 K/uL 7.5  6.3  6.7   Hemoglobin 12.0 - 15.0 g/dL 40.9  81.1  91.4   Hematocrit 36.0 - 46.0 % 40.3  44.2  46.0   Platelets 150 - 400 K/uL 259  258  251       Latest Ref Rng & Units 10/08/2021    8:02 PM 08/16/2021   10:16 AM 06/09/2021    5:28 PM  CMP  Glucose 70 - 99 mg/dL 782  956  213   BUN 6 - 20 mg/dL 16  14  9    Creatinine 0.44 - 1.00 mg/dL 0.86  5.78  4.69   Sodium 135 - 145 mmol/L 136  135  134   Potassium 3.5 - 5.1 mmol/L 5.3  3.7  4.0   Chloride 98 - 111 mmol/L 100  102  100   CO2 22 - 32 mmol/L 25  25  21    Calcium 8.9 - 10.3 mg/dL 62.9  9.0  8.5   Total Protein 6.5 - 8.1 g/dL 8.2   7.6   Total Bilirubin 0.3 - 1.2 mg/dL 2.6   1.5   Alkaline Phos 38 - 126 U/L 295   486   AST 15 - 41 U/L 164   256   ALT 0 - 44 U/L 140   334      No results found for: "CEA1", "CEA" / No results found for: "  CEA1", "CEA" No results found for: "PSA1" No results found for: "CHE527" No results found for: "CAN125"  No results found for: "TOTALPROTELP", "ALBUMINELP", "A1GS", "A2GS", "BETS", "BETA2SER", "GAMS", "MSPIKE", "SPEI" Lab Results  Component Value Date   TIBC 416 03/02/2023   TIBC 445 08/31/2022   TIBC 385 04/10/2022   FERRITIN 24 03/02/2023   FERRITIN 43 08/31/2022   FERRITIN 57 04/10/2022   IRONPCTSAT 18 03/02/2023   IRONPCTSAT 17 08/31/2022   IRONPCTSAT 35 (H) 04/10/2022   No results found for: "LDH"   STUDIES:   No results found.

## 2023-03-08 ENCOUNTER — Inpatient Hospital Stay (HOSPITAL_BASED_OUTPATIENT_CLINIC_OR_DEPARTMENT_OTHER): Payer: Commercial Managed Care - PPO | Admitting: Oncology

## 2023-03-08 VITALS — BP 138/89 | HR 70 | Temp 98.9°F | Resp 18 | Ht 64.0 in | Wt 284.0 lb

## 2023-03-08 DIAGNOSIS — D509 Iron deficiency anemia, unspecified: Secondary | ICD-10-CM

## 2023-03-08 DIAGNOSIS — E611 Iron deficiency: Secondary | ICD-10-CM | POA: Diagnosis not present

## 2023-03-15 ENCOUNTER — Inpatient Hospital Stay: Payer: Commercial Managed Care - PPO

## 2023-03-15 VITALS — BP 115/74 | HR 72 | Temp 97.8°F | Resp 20

## 2023-03-15 DIAGNOSIS — D509 Iron deficiency anemia, unspecified: Secondary | ICD-10-CM

## 2023-03-15 DIAGNOSIS — E611 Iron deficiency: Secondary | ICD-10-CM | POA: Diagnosis not present

## 2023-03-15 MED ORDER — ACETAMINOPHEN 325 MG PO TABS
650.0000 mg | ORAL_TABLET | Freq: Once | ORAL | Status: AC
  Administered 2023-03-15: 650 mg via ORAL
  Filled 2023-03-15: qty 2

## 2023-03-15 MED ORDER — LORATADINE 10 MG PO TABS: 10.0000 mg | ORAL_TABLET | Freq: Once | ORAL | Status: DC

## 2023-03-15 MED ORDER — CETIRIZINE HCL 10 MG PO TABS
10.0000 mg | ORAL_TABLET | Freq: Once | ORAL | Status: AC
  Administered 2023-03-15: 10 mg via ORAL
  Filled 2023-03-15: qty 1

## 2023-03-15 MED ORDER — SODIUM CHLORIDE 0.9 % IV SOLN: Freq: Once | INTRAVENOUS | Status: AC

## 2023-03-15 MED ORDER — SODIUM CHLORIDE 0.9 % IV SOLN
400.0000 mg | Freq: Once | INTRAVENOUS | Status: AC
  Administered 2023-03-15: 400 mg via INTRAVENOUS
  Filled 2023-03-15: qty 400

## 2023-03-15 NOTE — Progress Notes (Signed)
Pharmacy has substituted cetirizine 10 mg orally x 1 as premedication for   Loratidine discontinued.  V.O. Dr Carilyn Goodpasture, PharmD

## 2023-03-15 NOTE — Patient Instructions (Signed)
[facility] [place]  Discharge Instructions: Thank you for choosing [facility] to provide your oncology and hematology care.  If you have a lab appointment with the Cancer Center - please note that after April 8th, 2024, all labs will be drawn in the cancer center.  You do not have to check in or register with the main entrance as you have in the past but will complete your check-in in the cancer center.  Wear comfortable clothing and clothing appropriate for easy access to any Portacath or PICC line.   We strive to give you quality time with your provider. You may need to reschedule your appointment if you arrive late (15 or more minutes).  Arriving late affects you and other patients whose appointments are after yours.  Also, if you miss three or more appointments without notifying the office, you may be dismissed from the clinic at the provider's discretion.      For prescription refill requests, have your pharmacy contact our office and allow 72 hours for refills to be completed.    Today you received the following chemotherapy and/or immunotherapy agents Venofer      To help prevent nausea and vomiting after your treatment, we encourage you to take your nausea medication as directed.  BELOW ARE SYMPTOMS THAT SHOULD BE REPORTED IMMEDIATELY: *FEVER GREATER THAN 100.4 F (38 C) OR HIGHER *CHILLS OR SWEATING *NAUSEA AND VOMITING THAT IS NOT CONTROLLED WITH YOUR NAUSEA MEDICATION *UNUSUAL SHORTNESS OF BREATH *UNUSUAL BRUISING OR BLEEDING *URINARY PROBLEMS (pain or burning when urinating, or frequent urination) *BOWEL PROBLEMS (unusual diarrhea, constipation, pain near the anus) TENDERNESS IN MOUTH AND THROAT WITH OR WITHOUT PRESENCE OF ULCERS (sore throat, sores in mouth, or a toothache) UNUSUAL RASH, SWELLING OR PAIN  UNUSUAL VAGINAL DISCHARGE OR ITCHING   Items with * indicate a potential emergency and should be followed up as soon as possible or go to the  Emergency Department if any problems should occur.  Please show the CHEMOTHERAPY ALERT CARD or IMMUNOTHERAPY ALERT CARD at check-in to the Emergency Department and triage nurse.  Should you have questions after your visit or need to cancel or reschedule your appointment, please contact [facility] [place] [phone]  and follow the prompts.  Office hours are 8:00 a.m. to 4:30 p.m. Monday - Friday. Please note that voicemails left after 4:00 p.m. may not be returned until the following business day.  We are closed weekends and major holidays. You have access to a nurse at all times for urgent questions. Please call the main number to the clinic [phone] and follow the prompts.  For any non-urgent questions, you may also contact your provider using MyChart. We now offer e-Visits for anyone 18 and older to request care online for non-urgent symptoms. For details visit [email or web address].   Also download the MyChart app! Go to the app store, search "MyChart", open the app, select [facility], and log in with your MyChart username and password.   

## 2023-03-15 NOTE — Progress Notes (Signed)
Patient presents today fro Venofer infusion per providers order.  Vital signs WNL.  Patient has no new complaints at this time.    Peripheral IV started and blood return noted pre and post infusion.  Stable during infusion without adverse affects.  Vital signs stable.  No complaints at this time.  Discharge from clinic ambulatory in stable condition.  Alert and oriented X 3.  Follow up with [facility] as scheduled.

## 2023-03-22 ENCOUNTER — Inpatient Hospital Stay: Payer: Commercial Managed Care - PPO

## 2023-03-23 ENCOUNTER — Encounter (HOSPITAL_COMMUNITY): Payer: Self-pay | Admitting: Hematology

## 2023-03-23 ENCOUNTER — Inpatient Hospital Stay: Payer: Commercial Managed Care - PPO | Attending: Hematology

## 2023-03-23 ENCOUNTER — Inpatient Hospital Stay: Payer: Commercial Managed Care - PPO

## 2023-03-23 VITALS — BP 134/72 | HR 73 | Temp 97.7°F | Resp 18 | Wt 282.2 lb

## 2023-03-23 DIAGNOSIS — E611 Iron deficiency: Secondary | ICD-10-CM | POA: Insufficient documentation

## 2023-03-23 DIAGNOSIS — D509 Iron deficiency anemia, unspecified: Secondary | ICD-10-CM

## 2023-03-23 MED ORDER — CETIRIZINE HCL 10 MG PO TABS
10.0000 mg | ORAL_TABLET | Freq: Once | ORAL | Status: AC
  Administered 2023-03-23: 10 mg via ORAL
  Filled 2023-03-23: qty 1

## 2023-03-23 MED ORDER — SODIUM CHLORIDE 0.9 % IV SOLN
400.0000 mg | Freq: Once | INTRAVENOUS | Status: AC
  Administered 2023-03-23: 400 mg via INTRAVENOUS
  Filled 2023-03-23: qty 400

## 2023-03-23 MED ORDER — SODIUM CHLORIDE 0.9 % IV SOLN: Freq: Once | INTRAVENOUS | Status: AC

## 2023-03-23 MED ORDER — ACETAMINOPHEN 325 MG PO TABS
650.0000 mg | ORAL_TABLET | Freq: Once | ORAL | Status: AC
  Administered 2023-03-23: 650 mg via ORAL
  Filled 2023-03-23: qty 2

## 2023-03-23 NOTE — Progress Notes (Signed)
Patient presents today for Venofer infusion per providers order.  Vital signs WNL.  Patient has no new complaints at this time.  Peripheral IV started and blood return noted pre and post infusion.  Stable during infusion without adverse affects.  Vital signs stable.  No complaints at this time.  Discharge from clinic ambulatory in stable condition.  Alert and oriented X 3.  Follow up with [facility] as scheduled.  

## 2023-03-23 NOTE — Patient Instructions (Signed)
[facility]  Discharge Instructions: Thank you for choosing [facility] to provide your oncology and hematology care.  If you have a lab appointment with the Cancer Center - please note that after April 8th, 2024, all labs will be drawn in the cancer center.  You do not have to check in or register with the main entrance as you have in the past but will complete your check-in in the cancer center.  Wear comfortable clothing and clothing appropriate for easy access to any Portacath or PICC line.   We strive to give you quality time with your provider. You may need to reschedule your appointment if you arrive late (15 or more minutes).  Arriving late affects you and other patients whose appointments are after yours.  Also, if you miss three or more appointments without notifying the office, you may be dismissed from the clinic at the provider's discretion.      For prescription refill requests, have your pharmacy contact our office and allow 72 hours for refills to be completed.    Today you received Venofer      To help prevent nausea and vomiting after your treatment, we encourage you to take your nausea medication as directed.  BELOW ARE SYMPTOMS THAT SHOULD BE REPORTED IMMEDIATELY: *FEVER GREATER THAN 100.4 F (38 C) OR HIGHER *CHILLS OR SWEATING *NAUSEA AND VOMITING THAT IS NOT CONTROLLED WITH YOUR NAUSEA MEDICATION *UNUSUAL SHORTNESS OF BREATH *UNUSUAL BRUISING OR BLEEDING *URINARY PROBLEMS (pain or burning when urinating, or frequent urination) *BOWEL PROBLEMS (unusual diarrhea, constipation, pain near the anus) TENDERNESS IN MOUTH AND THROAT WITH OR WITHOUT PRESENCE OF ULCERS (sore throat, sores in mouth, or a toothache) UNUSUAL RASH, SWELLING OR PAIN  UNUSUAL VAGINAL DISCHARGE OR ITCHING   Items with * indicate a potential emergency and should be followed up as soon as possible or go to the Emergency Department if any problems should  occur.  Please show the CHEMOTHERAPY ALERT CARD or IMMUNOTHERAPY ALERT CARD at check-in to the Emergency Department and triage nurse.  Should you have questions after your visit or need to cancel or reschedule your appointment, please contact [facility] [phone]  and follow the prompts.  Office hours are 8:00 a.m. to 4:30 p.m. Monday - Friday. Please note that voicemails left after 4:00 p.m. may not be returned until the following business day.  We are closed weekends and major holidays. You have access to a nurse at all times for urgent questions. Please call the main number to the clinic [phone] and follow the prompts.  For any non-urgent questions, you may also contact your provider using MyChart. We now offer e-Visits for anyone 24 and older to request care online for non-urgent symptoms. For details visit [email or web address].   Also download the MyChart app! Go to the app store, search "MyChart", open the app, select [facility], and log in with your MyChart username and password.

## 2023-03-25 ENCOUNTER — Encounter (HOSPITAL_COMMUNITY): Payer: Self-pay | Admitting: Hematology

## 2023-03-30 ENCOUNTER — Inpatient Hospital Stay: Payer: Commercial Managed Care - PPO

## 2023-03-30 VITALS — BP 131/80 | HR 70 | Temp 97.3°F | Resp 18

## 2023-03-30 DIAGNOSIS — E611 Iron deficiency: Secondary | ICD-10-CM | POA: Diagnosis not present

## 2023-03-30 DIAGNOSIS — D509 Iron deficiency anemia, unspecified: Secondary | ICD-10-CM

## 2023-03-30 MED ORDER — ACETAMINOPHEN 325 MG PO TABS
650.0000 mg | ORAL_TABLET | Freq: Once | ORAL | Status: AC
  Administered 2023-03-30: 650 mg via ORAL
  Filled 2023-03-30: qty 2

## 2023-03-30 MED ORDER — SODIUM CHLORIDE 0.9 % IV SOLN: Freq: Once | INTRAVENOUS | Status: AC

## 2023-03-30 MED ORDER — SODIUM CHLORIDE 0.9 % IV SOLN
400.0000 mg | Freq: Once | INTRAVENOUS | Status: AC
  Administered 2023-03-30: 400 mg via INTRAVENOUS
  Filled 2023-03-30: qty 400

## 2023-03-30 MED ORDER — CETIRIZINE HCL 10 MG PO TABS
10.0000 mg | ORAL_TABLET | Freq: Once | ORAL | Status: AC
  Administered 2023-03-30: 10 mg via ORAL
  Filled 2023-03-30: qty 1

## 2023-03-30 NOTE — Patient Instructions (Signed)
[facility] [place]  Discharge Instructions: Thank you for choosing [facility] to provide your oncology and hematology care.  If you have a lab appointment with the Cancer Center - please note that after April 8th, 2024, all labs will be drawn in the cancer center.  You do not have to check in or register with the main entrance as you have in the past but will complete your check-in in the cancer center.  Wear comfortable clothing and clothing appropriate for easy access to any Portacath or PICC line.   We strive to give you quality time with your provider. You may need to reschedule your appointment if you arrive late (15 or more minutes).  Arriving late affects you and other patients whose appointments are after yours.  Also, if you miss three or more appointments without notifying the office, you may be dismissed from the clinic at the provider's discretion.      For prescription refill requests, have your pharmacy contact our office and allow 72 hours for refills to be completed.    Today you received the following chemotherapy and/or immunotherapy agents Venofer      To help prevent nausea and vomiting after your treatment, we encourage you to take your nausea medication as directed.  BELOW ARE SYMPTOMS THAT SHOULD BE REPORTED IMMEDIATELY: *FEVER GREATER THAN 100.4 F (38 C) OR HIGHER *CHILLS OR SWEATING *NAUSEA AND VOMITING THAT IS NOT CONTROLLED WITH YOUR NAUSEA MEDICATION *UNUSUAL SHORTNESS OF BREATH *UNUSUAL BRUISING OR BLEEDING *URINARY PROBLEMS (pain or burning when urinating, or frequent urination) *BOWEL PROBLEMS (unusual diarrhea, constipation, pain near the anus) TENDERNESS IN MOUTH AND THROAT WITH OR WITHOUT PRESENCE OF ULCERS (sore throat, sores in mouth, or a toothache) UNUSUAL RASH, SWELLING OR PAIN  UNUSUAL VAGINAL DISCHARGE OR ITCHING   Items with * indicate a potential emergency and should be followed up as soon as possible or go to the  Emergency Department if any problems should occur.  Please show the CHEMOTHERAPY ALERT CARD or IMMUNOTHERAPY ALERT CARD at check-in to the Emergency Department and triage nurse.  Should you have questions after your visit or need to cancel or reschedule your appointment, please contact [facility] [place] [phone]  and follow the prompts.  Office hours are 8:00 a.m. to 4:30 p.m. Monday - Friday. Please note that voicemails left after 4:00 p.m. may not be returned until the following business day.  We are closed weekends and major holidays. You have access to a nurse at all times for urgent questions. Please call the main number to the clinic [phone] and follow the prompts.  For any non-urgent questions, you may also contact your provider using MyChart. We now offer e-Visits for anyone 18 and older to request care online for non-urgent symptoms. For details visit [email or web address].   Also download the MyChart app! Go to the app store, search "MyChart", open the app, select [facility], and log in with your MyChart username and password.   

## 2023-03-30 NOTE — Progress Notes (Signed)
Patient presents today for Venofer infusion per providers order.  Vital signs within parameters for treatment.  Peripheral IV started and blood return pre and post infusion.  Stable during infusion without adverse affects.  Vital signs stable.  No complaints at this time.  Discharge from clinic ambulatory in stable condition.  Alert and oriented X 3.  Follow up with [facility] as scheduled.

## 2023-08-30 ENCOUNTER — Other Ambulatory Visit: Payer: Self-pay

## 2023-08-30 DIAGNOSIS — D509 Iron deficiency anemia, unspecified: Secondary | ICD-10-CM

## 2023-08-31 ENCOUNTER — Other Ambulatory Visit: Payer: Self-pay | Admitting: Internal Medicine

## 2023-08-31 ENCOUNTER — Inpatient Hospital Stay: Payer: Commercial Managed Care - PPO

## 2023-08-31 DIAGNOSIS — Z1231 Encounter for screening mammogram for malignant neoplasm of breast: Secondary | ICD-10-CM

## 2023-09-07 ENCOUNTER — Telehealth: Payer: Commercial Managed Care - PPO | Admitting: Oncology

## 2023-09-14 ENCOUNTER — Encounter (HOSPITAL_COMMUNITY): Payer: Self-pay | Admitting: Hematology

## 2023-09-14 ENCOUNTER — Inpatient Hospital Stay: Attending: Hematology

## 2023-09-14 DIAGNOSIS — D509 Iron deficiency anemia, unspecified: Secondary | ICD-10-CM

## 2023-09-14 DIAGNOSIS — E611 Iron deficiency: Secondary | ICD-10-CM | POA: Insufficient documentation

## 2023-09-14 LAB — CBC WITH DIFFERENTIAL/PLATELET
Abs Immature Granulocytes: 0.03 10*3/uL (ref 0.00–0.07)
Basophils Absolute: 0.1 10*3/uL (ref 0.0–0.1)
Basophils Relative: 2 %
Eosinophils Absolute: 0.3 10*3/uL (ref 0.0–0.5)
Eosinophils Relative: 5 %
HCT: 43.8 % (ref 36.0–46.0)
Hemoglobin: 14 g/dL (ref 12.0–15.0)
Immature Granulocytes: 1 %
Lymphocytes Relative: 30 %
Lymphs Abs: 1.8 10*3/uL (ref 0.7–4.0)
MCH: 29.9 pg (ref 26.0–34.0)
MCHC: 32 g/dL (ref 30.0–36.0)
MCV: 93.6 fL (ref 80.0–100.0)
Monocytes Absolute: 0.4 10*3/uL (ref 0.1–1.0)
Monocytes Relative: 7 %
Neutro Abs: 3.4 10*3/uL (ref 1.7–7.7)
Neutrophils Relative %: 55 %
Platelets: 286 10*3/uL (ref 150–400)
RBC: 4.68 MIL/uL (ref 3.87–5.11)
RDW: 12.9 % (ref 11.5–15.5)
WBC: 6 10*3/uL (ref 4.0–10.5)
nRBC: 0 % (ref 0.0–0.2)

## 2023-09-14 LAB — IRON AND TIBC
Iron: 88 ug/dL (ref 28–170)
Saturation Ratios: 23 % (ref 10.4–31.8)
TIBC: 386 ug/dL (ref 250–450)
UIBC: 298 ug/dL

## 2023-09-14 LAB — FERRITIN: Ferritin: 136 ng/mL (ref 11–307)

## 2023-09-17 ENCOUNTER — Encounter (HOSPITAL_COMMUNITY): Payer: Self-pay | Admitting: Hematology

## 2023-09-18 ENCOUNTER — Other Ambulatory Visit: Payer: Self-pay | Admitting: *Deleted

## 2023-09-18 ENCOUNTER — Encounter

## 2023-09-21 ENCOUNTER — Ambulatory Visit
Admission: RE | Admit: 2023-09-21 | Discharge: 2023-09-21 | Disposition: A | Discharge status: Home / Self Care | Source: Ambulatory Visit | Attending: Internal Medicine | Admitting: Internal Medicine

## 2023-09-21 ENCOUNTER — Inpatient Hospital Stay: Attending: Hematology | Admitting: Oncology

## 2023-09-21 ENCOUNTER — Encounter (HOSPITAL_COMMUNITY): Payer: Self-pay | Admitting: Hematology

## 2023-09-21 DIAGNOSIS — I6389 Other cerebral infarction: Secondary | ICD-10-CM | POA: Diagnosis not present

## 2023-09-21 DIAGNOSIS — Z86711 Personal history of pulmonary embolism: Secondary | ICD-10-CM | POA: Diagnosis not present

## 2023-09-21 DIAGNOSIS — D509 Iron deficiency anemia, unspecified: Secondary | ICD-10-CM | POA: Diagnosis not present

## 2023-09-21 DIAGNOSIS — Z1231 Encounter for screening mammogram for malignant neoplasm of breast: Secondary | ICD-10-CM | POA: Insufficient documentation

## 2023-09-21 NOTE — Progress Notes (Signed)
 Virtual Visit via Telephone Note  I connected with Tanya [facility] on 09/21/23 at  8:30 AM EDT by telephone and verified that I am speaking with the correct person using two identifiers.  Location: Patient: Home  Provider: Clinic   I discussed the limitations, risks, security and privacy concerns of performing an evaluation and management service by telephone and the availability of in person appointments. I also discussed with the patient that there may be a patient responsible charge related to this service. The patient expressed understanding and agreed to proceed.   History of Present Illness:  Patient is a 48 year old female who returns to clinic virtually to discuss labs and follow-up on iron deficiency with intermittent IV iron.  She was last seen in clinic by me on 03/08/2023.  In the interim, she denies any hospitalizations, surgeries or changes to her baseline health.  She received 3 doses of IV Venofer 400 mg on 03/15/2023, 03/23/2023 and 03/30/2023.  She tolerated infusions well.  She is followed closely by [facility] for primary biliary cirrhosis with Dr. Waynetta Sandy. She has intermittent abdominal pain as well as fatigue.  Reports she is having a repeat liver biopsy due to elevated liver enzymes later this month.  She is not taking oral iron supplements because she was instructed not to.  She denies any bleeding, bright red blood per rectum, melena or hematochezia.  She does have menstrual cycles that are irregular that are lasting approximately 7 days that she describes as light to moderately heavy.   Patient had a screening mammogram today.  Results are pending.    Observations/Objective: Review of Systems  Constitutional:  Positive for malaise/fatigue.  HENT:  Positive for sore throat.   Gastrointestinal:  Positive for abdominal pain. Negative for blood in stool.  Genitourinary:        Irregular menstrual cycles  Neurological:  Positive for tingling.  Psychiatric/Behavioral:   Positive for depression. The patient is nervous/anxious and has insomnia.    Physical Exam Neurological:     Mental Status: She is oriented to person, place, and time.      Assessment and Plan: 1. Iron deficiency anemia, unspecified iron deficiency anemia type (Primary) - Etiology likely secondary to long moderately heavy menstrual cycles along with malabsorption.  -She was on iron tablets but was instructed to stop back in May 2023.  -She received 3 doses of 400 mg IV Venofer on 03/15/2023, 03/23/2023 and 03/30/2023 with good tolerance. -Repeat labs from 09/14/2023 show iron saturations of 23%, ferritin 136 and hemoglobin of 14.0.  Differential is normal. -No additional IV iron needed at this time. -Recommend repeat labs in 6 months and see provider in clinic.  2. History of pulmonary embolism - Elevated D-dimer of 0.77 on 01/11/2022.  History of pulmonary embolism when she was hospitalized at [facility], could not be anticoagulated secondary to bleeding issues at that time. - CT angiogram (02/01/2022): Negative for pulmonary embolism.  It showed increased caliber of the main pulmonary artery compatible with PAH.  6 mm lung nodule consistent with intrapulmonary lymph node, considered benign. - Echocardiogram (04/10/2022): LVEF 60 to 65%.  Right atrium and ventricle normal in size.  No indication of pulmonary arterial hypertension. - She reported chest tightness in the upper chest lasting few minutes, occasionally with dizziness.  If the pain worsens/persists, she was told to get evaluated with cardiology.  3. Cerebrovascular accident (CVA) due to other mechanism [facility]) - Presented with right-sided weakness.  PMH of hypertension, hyperlipidemia, obesity.  Hypercoagulable  work-up showed normal protein C, protein S and ATIII.  [place]VL [place] [address] they were normal.  IgM anticardiolipin antibody was elevated at 45.  LA and beta-2 glycoprotein 1 normal. - She was evaluated by Dr. Smith Robert and repeat IgM  anticardiolipin antibody normalized.  No evidence for diagnosis of antiphospholipid antibody syndrome.   4.  Primary biliary cirrhosis -Followed by [facility] Dr. Waynetta Sandy. -Referred for elevated liver enzymes and periportal/portocaval lymphadenopathy. 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  -She is currently on Ursodiol and cholestyramine.  -She is scheduled for repeat liver biopsy at the end of the month due to persistently elevated LFTs.  Follow Up Instructions: -No additional IV iron needed at this time. -Return to clinic in 6 months with labs a few days before.   I discussed the assessment and treatment plan with the patient. The patient was provided an opportunity to ask questions and all were answered. The patient agreed with the plan and demonstrated an understanding of the instructions.   The patient was advised to call back or seek an in-person evaluation if the symptoms worsen or if the condition fails to improve as anticipated.  I provided 18 minutes of non-face-to-face time during this encounter.  [name], NP

## 2024-01-17 ENCOUNTER — Encounter (INDEPENDENT_AMBULATORY_CARE_PROVIDER_SITE_OTHER): Payer: Self-pay

## 2024-03-14 ENCOUNTER — Encounter (HOSPITAL_COMMUNITY): Payer: Self-pay | Admitting: Oncology

## 2024-03-21 ENCOUNTER — Inpatient Hospital Stay: Attending: Oncology

## 2024-03-21 DIAGNOSIS — Z8673 Personal history of transient ischemic attack (TIA), and cerebral infarction without residual deficits: Secondary | ICD-10-CM | POA: Insufficient documentation

## 2024-03-21 DIAGNOSIS — R748 Abnormal levels of other serum enzymes: Secondary | ICD-10-CM | POA: Insufficient documentation

## 2024-03-21 DIAGNOSIS — Z86718 Personal history of other venous thrombosis and embolism: Secondary | ICD-10-CM | POA: Insufficient documentation

## 2024-03-21 DIAGNOSIS — K743 Primary biliary cirrhosis: Secondary | ICD-10-CM | POA: Insufficient documentation

## 2024-03-21 DIAGNOSIS — D509 Iron deficiency anemia, unspecified: Secondary | ICD-10-CM | POA: Insufficient documentation

## 2024-03-21 DIAGNOSIS — R591 Generalized enlarged lymph nodes: Secondary | ICD-10-CM | POA: Insufficient documentation

## 2024-03-21 DIAGNOSIS — Z86711 Personal history of pulmonary embolism: Secondary | ICD-10-CM | POA: Insufficient documentation

## 2024-03-25 ENCOUNTER — Encounter (HOSPITAL_COMMUNITY): Payer: Self-pay | Admitting: Oncology

## 2024-03-28 ENCOUNTER — Encounter (HOSPITAL_COMMUNITY): Payer: Self-pay | Admitting: Oncology

## 2024-03-28 ENCOUNTER — Inpatient Hospital Stay: Admitting: Oncology

## 2024-03-28 ENCOUNTER — Inpatient Hospital Stay

## 2024-03-28 DIAGNOSIS — D509 Iron deficiency anemia, unspecified: Secondary | ICD-10-CM | POA: Diagnosis present

## 2024-03-28 DIAGNOSIS — R591 Generalized enlarged lymph nodes: Secondary | ICD-10-CM | POA: Diagnosis not present

## 2024-03-28 DIAGNOSIS — K743 Primary biliary cirrhosis: Secondary | ICD-10-CM | POA: Diagnosis not present

## 2024-03-28 DIAGNOSIS — Z8673 Personal history of transient ischemic attack (TIA), and cerebral infarction without residual deficits: Secondary | ICD-10-CM | POA: Diagnosis not present

## 2024-03-28 DIAGNOSIS — Z86711 Personal history of pulmonary embolism: Secondary | ICD-10-CM | POA: Diagnosis not present

## 2024-03-28 DIAGNOSIS — R748 Abnormal levels of other serum enzymes: Secondary | ICD-10-CM | POA: Diagnosis not present

## 2024-03-28 DIAGNOSIS — Z86718 Personal history of other venous thrombosis and embolism: Secondary | ICD-10-CM | POA: Diagnosis not present

## 2024-03-28 LAB — CBC WITH DIFFERENTIAL/PLATELET
Abs Immature Granulocytes: 0.02 K/uL (ref 0.00–0.07)
Basophils Absolute: 0.1 K/uL (ref 0.0–0.1)
Basophils Relative: 1 %
Eosinophils Absolute: 0.3 K/uL (ref 0.0–0.5)
Eosinophils Relative: 4 %
HCT: 43.5 % (ref 36.0–46.0)
Hemoglobin: 13.9 g/dL (ref 12.0–15.0)
Immature Granulocytes: 0 %
Lymphocytes Relative: 29 %
Lymphs Abs: 1.9 K/uL (ref 0.7–4.0)
MCH: 30.5 pg (ref 26.0–34.0)
MCHC: 32 g/dL (ref 30.0–36.0)
MCV: 95.6 fL (ref 80.0–100.0)
Monocytes Absolute: 0.5 K/uL (ref 0.1–1.0)
Monocytes Relative: 7 %
Neutro Abs: 4 K/uL (ref 1.7–7.7)
Neutrophils Relative %: 59 %
Platelets: 244 K/uL (ref 150–400)
RBC: 4.55 MIL/uL (ref 3.87–5.11)
RDW: 13.5 % (ref 11.5–15.5)
WBC: 6.7 K/uL (ref 4.0–10.5)
nRBC: 0 % (ref 0.0–0.2)

## 2024-03-28 LAB — IRON AND TIBC
Iron: 62 ug/dL (ref 28–170)
Saturation Ratios: 19 % (ref 10.4–31.8)
TIBC: 322 ug/dL (ref 250–450)
UIBC: 261 ug/dL

## 2024-03-28 LAB — FERRITIN: Ferritin: 140 ng/mL (ref 11–307)

## 2024-03-31 NOTE — Progress Notes (Signed)
 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 Inadequate response to budesonide. I have prescribed MMF but patient has been quite hesitant to start it. I again described the alternative treatment options. Particularly in light of patient's concern about the potential to progress to cirrhosis and liver failure, I emphasized the importance of getting on a regimen that will control liver inflammation. I again explained the side effects of CellCept. I explained that weight loss would be good for health but is unlikely to result in improvement in liver enzymes given minimal steatohepatitis. She expressed understanding and stated she would start MMF. - Start low-dose CellCept (mycophenolate mofetil) 250 mg BID. - Continue budesonide 9 mg daily. - Continue ursodiol. - Monitor liver enzymes monthly. - Educated on potential side effects of CellCept - Continue cholestyramine daily - ensure this is timed separate from other medications.  Gastroesophageal reflux disease (GERD) Intermittent upper abdominal pain and sensation of food not being digested, possibly related to GERD. - Proceed with scheduled upper endoscopy next month.  Pulmonary nodule, unspecified Incidental finding of a lung nodule with no concerning findings. - Order repeat CT scan of the chest in December.   I personally spent 45 minutes face-to-face and non-face-to-face in the care of this patient, which includes all pre, intra, and post visit time on the date of service.  All documented time was specific to the E/M visit and does not include any procedures that may have been performed. This included 25 minutes in review of labs, imaging and prior notes and 20  minutes in face-to-face care including history taking, physical exam and counseling  Tanya Feeling, MD, MPH Assistant Professor of Medicine [facility] Division of Gastroenterology and Hepatology   Subjective History of Present Illness  Accompanied by: N/A (unaccompanied)  48 y.o. female with obesity, cholecystectomy, ischemic stroke (05/2021) referred for elevated liver enzymes and imaging with periportal/portacaval lymphadenopathy. She received an EUS-guided liver and lymph node biopsy c/w PBC (also elevated ALP 212, + AMA, +IgM) and possible IgG4 related disease. Started on ursodiol with improvement of ALP but recent rise in AST and ALT. Subsequent biopsy revealed IgG4 related disease.   History of Present Illness Tanya Henderson is a 48 year old female with primary biliary cholangitis and IgG4-related disease who presents for follow up.  She has a history of primary biliary cholangitis diagnosed via EUS-guided liver and lymph node biopsy. Initially, she presented with elevated liver enzymes, specifically alkaline phosphatase, and was positive for AMA and IgM. A biopsy was performed, and the results were consistent with possible IgG4-related disease. She was started on ursodiol, which initially improved her alkaline phosphatase levels. However, her AST and ALT levels subsequently rose, and a repeat biopsy confirmed IgG4-related disease. She experiences intermittent upper abdominal pain and itching. She is currently taking cholestyramine, one pack per day, and times it away from other medications.  She has been on budesonide 9 mg, but her liver enzymes have remained elevated for nine months. She is concerned about the potential side effects of immunosuppressants, particularly regarding GI side effects and hirsutism. She has been hesitant to start CellCept despite having the medication at home.  She experiences symptoms of GERD, describing  a sensation of food not being  digested and worsening reflux. She is scheduled for an upper endoscopy next month to further evaluate these symptoms.  She reports a lung nodule found on imaging, which has caused her anxiety, and she experiences occasional shortness of breath with a pulse oximeter reading as low as 95%. She is scheduled for a repeat CT scan of the chest in December.  Objective Physical Exam  Vital Signs: BP 150/89 (BP Site: L Wrist, BP Position: Sitting, BP Cuff Size: Large)   Pulse 87   Temp 36.7 C (98.1 F) (Tympanic)   Wt (!) 134.8 kg (297 lb 1.6 oz)   SpO2 100%   BMI 50.97 kg/m  Constitutional: She is in no apparent distress Eyes: Anicteric sclerae Cardiovascular: No peripheral edema Gastrointestinal: Soft, nontender abdomen without hepatosplenomegaly, hernias, or masses Neurologic: Awake, alert, and oriented to person, place, and time with normal speech and no asterixis

## 2024-04-02 ENCOUNTER — Inpatient Hospital Stay: Admitting: Oncology

## 2024-04-02 VITALS — BP 115/83 | HR 80 | Temp 97.6°F | Resp 18 | Ht 64.0 in | Wt 298.0 lb

## 2024-04-02 DIAGNOSIS — I639 Cerebral infarction, unspecified: Secondary | ICD-10-CM

## 2024-04-02 DIAGNOSIS — D508 Other iron deficiency anemias: Secondary | ICD-10-CM | POA: Diagnosis not present

## 2024-04-02 DIAGNOSIS — K743 Primary biliary cirrhosis: Secondary | ICD-10-CM

## 2024-04-02 DIAGNOSIS — K7460 Unspecified cirrhosis of liver: Secondary | ICD-10-CM | POA: Insufficient documentation

## 2024-04-02 DIAGNOSIS — D509 Iron deficiency anemia, unspecified: Secondary | ICD-10-CM | POA: Diagnosis not present

## 2024-04-02 DIAGNOSIS — Z86718 Personal history of other venous thrombosis and embolism: Secondary | ICD-10-CM | POA: Diagnosis not present

## 2024-04-02 NOTE — Assessment & Plan Note (Addendum)
-   Etiology likely secondary to malabsorption.  Reports she has not had a cycle in greater than 1 year. -She was on iron  tablets but was instructed to stop back in May 2023.  -She received 3 doses of 400 mg IV Venofer  on 03/15/2023, 03/23/2023 and 03/30/2023 with good tolerance. -Repeat labs from 03/28/2024 which showed iron  saturation 19% with normal TIBC.  Hemoglobin 13.9 and ferritin 140. -Would recommend 3 doses of 300 mg IV Venofer . -Recommend repeat labs in 6 months and see provider in clinic.

## 2024-04-02 NOTE — Progress Notes (Signed)
 [facility] OFFICE PROGRESS NOTE  Tanya Manna, MD  ASSESSMENT & PLAN:    Assessment & Plan Other iron  deficiency anemia - Etiology likely secondary to malabsorption.  Reports she has not had a cycle in greater than 1 year. -She was on iron  tablets but was instructed to stop back in May 2023.  -She received 3 doses of 400 mg IV Venofer  on 03/15/2023, 03/23/2023 and 03/30/2023 with good tolerance. -Repeat labs from 03/28/2024 which showed iron  saturation 19% with normal TIBC.  Hemoglobin 13.9 and ferritin 140. -Would recommend 3 doses of 300 mg IV Venofer . -Recommend repeat labs in 6 months and see provider in clinic.   Personal history of venous thrombosis and embolism - Elevated D-dimer of 0.77 on 01/11/2022.  History of pulmonary embolism when she was hospitalized at [facility], could not be anticoagulated secondary to bleeding issues at that time. - CT angiogram (02/01/2022): Negative for pulmonary embolism.  It showed increased caliber of the main pulmonary artery compatible with PAH.  6 mm lung nodule consistent with intrapulmonary lymph node, considered benign. - Echocardiogram (04/10/2022): LVEF 60 to 65%.  Right atrium and ventricle normal in size.  No indication of pulmonary arterial hypertension. - She reported chest tightness in the upper chest lasting few minutes, occasionally with dizziness.  If the pain worsens/persists, she was told to get evaluated with cardiology. Cerebrovascular accident (CVA), unspecified mechanism (HCC) - Presented with right-sided weakness.  PMH of hypertension, hyperlipidemia, obesity.  Hypercoagulable work-up showed normal protein C, protein S and ATIII.  FVL and [place] 20210 they were normal.  IgM anticardiolipin antibody was elevated at 45.  LA and beta-2 glycoprotein 1 normal. - She was evaluated by Dr. Melanee and repeat IgM anticardiolipin antibody normalized.  No evidence for diagnosis of antiphospholipid antibody syndrome.  Hepatic  cirrhosis due to primary biliary cholangitis (HCC) -Followed by [facility] Dr. Erick. -Referred for elevated liver enzymes and periportal/portocaval lymphadenopathy. -Had EUS guided liver and lymph node biopsy which showed PBC and possible IgG4 related disease. -She is currently on Ursodiol and cholestyramine.  - Patient recently had elevated liver enzymes prompting repeat biopsy which showed autoimmune hepatitis. -Dr. Erick is still not convinced it is IgG4 related disease and did not get good response with budesonide and ursodiol.  -Next step is to start CellCept although she is reluctant. -Reports she has close follow-up with GI.  She is scheduled for an upper endoscopy on 04/21/2024.  Orders Placed This Encounter  Procedures   CBC with Differential    Standing Status:   Future    Expected Date:   10/08/2024    Expiration Date:   04/02/2025   Ferritin    Standing Status:   Future    Expected Date:   10/08/2024    Expiration Date:   04/02/2025   Iron  and TIBC ([facility] ONLY)    Standing Status:   Future    Expected Date:   10/08/2024    Expiration Date:   04/02/2025    INTERVAL HISTORY: Patient is a 48 year old female who returns to clinic to discuss labs and follow-up on iron  deficiency with intermittent IV iron .    In the interim, she was seen by [facility] (12/26/2023) for chest pain and throat tightness.  Thought to be due to GERD and anxiety.  She saw Dr. Erick on 01/11/2024 for follow-up for elevated liver enzymes and liver biopsy that showed autoimmune hepatitis.  She continued ursodiol and budesonide.  Steroids did not help  her liver enzymes.  Dr. Erick recommended CellCept but she is reluctant to start this medication.  Reports she has a prescription that is full but is worried about all the side effects.  She is followed by urology for history of status post [facility] with Dr. Mitch on 12/13/2022.  She had a KUB which was essentially unremarkable and stable from prior.  She was  scheduled to have a CT renal stone study.  She was treated for allergies on 02/21/2024 with OTC medications.   She received 3 doses of IV Venofer  400 mg on 03/15/2023, 03/23/2023 and 03/30/2023.  She tolerated infusions well.   She is not taking oral iron  supplements because she was instructed not to.   She denies any bleeding, bright red blood per rectum, melena or hematochezia.  Reports she has not had a menstrual cycle in greater than 10 months.  Believes she is about to go through menopause.  We reviewed cbc, cmp, ferritin and iron  panel  SUMMARY OF HEMATOLOGIC HISTORY: Oncology History   No history exists.     CBC    Component Value Date/Time   WBC 6.7 03/28/2024 0835   RBC 4.55 03/28/2024 0835   HGB 13.9 03/28/2024 0835   HGB 14.7 03/24/2013 1809   HCT 43.5 03/28/2024 0835   HCT 43.6 03/24/2013 1809   PLT 244 03/28/2024 0835   PLT 281 03/24/2013 1809   MCV 95.6 03/28/2024 0835   MCV 89 03/24/2013 1809   MCH 30.5 03/28/2024 0835   MCHC 32.0 03/28/2024 0835   RDW 13.5 03/28/2024 0835   RDW 13.8 03/24/2013 1809   LYMPHSABS 1.9 03/28/2024 0835   MONOABS 0.5 03/28/2024 0835   EOSABS 0.3 03/28/2024 0835   BASOSABS 0.1 03/28/2024 0835       Latest Ref Rng & Units 10/08/2021    8:02 PM 08/16/2021   10:16 AM 06/09/2021    5:28 PM  CMP  Glucose 70 - 99 mg/dL 895  890  876   BUN 6 - 20 mg/dL 16  14  9    Creatinine 0.44 - 1.00 mg/dL 9.26  9.30  9.27   Sodium 135 - 145 mmol/L 136  135  134   Potassium 3.5 - 5.1 mmol/L 5.3  3.7  4.0   Chloride 98 - 111 mmol/L 100  102  100   CO2 22 - 32 mmol/L 25  25  21    Calcium  8.9 - 10.3 mg/dL 89.0  9.0  8.5   Total Protein 6.5 - 8.1 g/dL 8.2   7.6   Total Bilirubin 0.3 - 1.2 mg/dL 2.6   1.5   Alkaline Phos 38 - 126 U/L 295   486   AST 15 - 41 U/L 164   256   ALT 0 - 44 U/L 140   334      Lab Results  Component Value Date   FERRITIN 140 03/28/2024   VITAMINB12 233 04/10/2022    Vitals:   04/02/24 1023 04/02/24 1030  BP: (!)  127/90 115/83  Pulse: 80   Resp: 18   Temp: 97.6 F (36.4 C)   SpO2: 97%     Review of System:  Review of Systems  Constitutional:  Positive for malaise/fatigue.  Respiratory:  Positive for shortness of breath.   Cardiovascular:  Positive for chest pain.  Gastrointestinal:  Positive for abdominal pain and nausea.  Psychiatric/Behavioral:  The patient has insomnia.     Physical Exam: Physical Exam Constitutional:  Appearance: Normal appearance. She is obese.  HENT:     Head: Normocephalic and atraumatic.  Eyes:     Pupils: Pupils are equal, round, and reactive to light.  Cardiovascular:     Rate and Rhythm: Normal rate and regular rhythm.     Heart sounds: Normal heart sounds. No murmur heard. Pulmonary:     Effort: Pulmonary effort is normal.     Breath sounds: Normal breath sounds. No wheezing.  Abdominal:     General: Bowel sounds are normal. There is no distension.     Palpations: Abdomen is soft.     Tenderness: There is no abdominal tenderness.  Musculoskeletal:        General: Normal range of motion.     Cervical back: Normal range of motion.  Skin:    General: Skin is warm and dry.     Findings: No rash.  Neurological:     Mental Status: She is alert and oriented to person, place, and time.     Gait: Gait is intact.  Psychiatric:        Mood and Affect: Mood and affect normal.        Cognition and Memory: Memory normal.        Judgment: Judgment normal.      I spent 25 minutes dedicated to the care of this patient (face-to-face and non-face-to-face) on the date of the encounter to include what is described in the assessment and plan.,  Delon Hope, NP 04/02/2024 12:59 PM

## 2024-04-02 NOTE — Assessment & Plan Note (Addendum)
-  Followed by [facility] Dr. Erick. -Referred for elevated liver enzymes and periportal/portocaval lymphadenopathy. -Had EUS guided liver and lymph node biopsy which showed PBC and possible IgG4 related disease. -She is currently on Ursodiol and cholestyramine.  - Patient recently had elevated liver enzymes prompting repeat biopsy which showed autoimmune hepatitis. -Dr. Erick is still not convinced it is IgG4 related disease and did not get good response with budesonide and ursodiol.  -Next step is to start CellCept although she is reluctant. -Reports she has close follow-up with GI.  She is scheduled for an upper endoscopy on 04/21/2024.

## 2024-04-02 NOTE — Assessment & Plan Note (Addendum)
-   Elevated D-dimer of 0.77 on 01/11/2022.  History of pulmonary embolism when she was hospitalized at [facility], could not be anticoagulated secondary to bleeding issues at that time. - CT angiogram (02/01/2022): Negative for pulmonary embolism.  It showed increased caliber of the main pulmonary artery compatible with PAH.  6 mm lung nodule consistent with intrapulmonary lymph node, considered benign. - Echocardiogram (04/10/2022): LVEF 60 to 65%.  Right atrium and ventricle normal in size.  No indication of pulmonary arterial hypertension. - She reported chest tightness in the upper chest lasting few minutes, occasionally with dizziness.  If the pain worsens/persists, she was told to get evaluated with cardiology.

## 2024-04-02 NOTE — Assessment & Plan Note (Addendum)
-   Presented with right-sided weakness.  PMH of hypertension, hyperlipidemia, obesity.  Hypercoagulable work-up showed normal protein C, protein S and ATIII.  FVL and PT 20210 they were normal.  IgM anticardiolipin antibody was elevated at 45.  LA and beta-2 glycoprotein 1 normal. - She was evaluated by Dr. Melanee and repeat IgM anticardiolipin antibody normalized.  No evidence for diagnosis of antiphospholipid antibody syndrome.

## 2024-04-15 ENCOUNTER — Encounter: Payer: Self-pay | Admitting: Oncology

## 2024-04-18 ENCOUNTER — Inpatient Hospital Stay

## 2024-04-25 ENCOUNTER — Encounter: Payer: Self-pay | Admitting: Oncology

## 2024-04-25 ENCOUNTER — Inpatient Hospital Stay: Attending: Oncology

## 2024-04-25 VITALS — BP 123/73 | HR 72 | Temp 96.2°F | Resp 20

## 2024-04-25 DIAGNOSIS — D500 Iron deficiency anemia secondary to blood loss (chronic): Secondary | ICD-10-CM

## 2024-04-25 DIAGNOSIS — D509 Iron deficiency anemia, unspecified: Secondary | ICD-10-CM | POA: Insufficient documentation

## 2024-04-25 MED ORDER — CETIRIZINE HCL 10 MG PO TABS
10.0000 mg | ORAL_TABLET | Freq: Once | ORAL | Status: AC
  Administered 2024-04-25: 10 mg via ORAL
  Filled 2024-04-25: qty 1

## 2024-04-25 MED ORDER — SODIUM CHLORIDE 0.9 % IV SOLN
300.0000 mg | Freq: Once | INTRAVENOUS | Status: AC
  Administered 2024-04-25: 300 mg via INTRAVENOUS
  Filled 2024-04-25: qty 300

## 2024-04-25 MED ORDER — SODIUM CHLORIDE 0.9 % IV SOLN: INTRAVENOUS | Status: DC

## 2024-04-25 NOTE — Patient Instructions (Signed)

## 2024-04-25 NOTE — Progress Notes (Signed)
 Patient tolerated iron infusion with no complaints voiced.  Peripheral IV site clean and dry with good blood return noted before and after infusion.  Band aid applied.  VSS with discharge and left in satisfactory condition with no s/s of distress noted.

## 2024-04-29 ENCOUNTER — Encounter: Payer: Self-pay | Admitting: Oncology

## 2024-05-02 ENCOUNTER — Inpatient Hospital Stay

## 2024-05-23 ENCOUNTER — Encounter: Payer: Self-pay | Admitting: Oncology

## 2024-05-23 ENCOUNTER — Inpatient Hospital Stay: Attending: Oncology

## 2024-05-23 VITALS — BP 141/76 | HR 67 | Temp 96.9°F | Resp 20

## 2024-05-23 DIAGNOSIS — D509 Iron deficiency anemia, unspecified: Secondary | ICD-10-CM | POA: Insufficient documentation

## 2024-05-23 DIAGNOSIS — D500 Iron deficiency anemia secondary to blood loss (chronic): Secondary | ICD-10-CM

## 2024-05-23 MED ORDER — SODIUM CHLORIDE 0.9 % IV SOLN: INTRAVENOUS | Status: DC

## 2024-05-23 MED ORDER — IRON SUCROSE 300 MG IVPB - SIMPLE MED
300.0000 mg | Freq: Once | Status: AC
  Administered 2024-05-23: 300 mg via INTRAVENOUS
  Filled 2024-05-23: qty 100

## 2024-05-23 MED ORDER — CETIRIZINE HCL 10 MG PO TABS
10.0000 mg | ORAL_TABLET | Freq: Once | ORAL | Status: AC
  Administered 2024-05-23: 10 mg via ORAL
  Filled 2024-05-23: qty 1

## 2024-05-23 NOTE — Patient Instructions (Signed)

## 2024-05-23 NOTE — Progress Notes (Signed)
 Patient tolerated iron infusion with no complaints voiced.  Peripheral IV site clean and dry with good blood return noted before and after infusion.  Band aid applied.  VSS with discharge and left in satisfactory condition with no s/s of distress noted.

## 2024-05-30 ENCOUNTER — Encounter: Payer: Self-pay | Admitting: Oncology

## 2024-05-30 ENCOUNTER — Inpatient Hospital Stay

## 2024-06-06 ENCOUNTER — Inpatient Hospital Stay

## 2024-06-06 VITALS — BP 142/87 | HR 77 | Temp 97.0°F | Resp 20

## 2024-06-06 DIAGNOSIS — D500 Iron deficiency anemia secondary to blood loss (chronic): Secondary | ICD-10-CM

## 2024-06-06 DIAGNOSIS — D509 Iron deficiency anemia, unspecified: Secondary | ICD-10-CM | POA: Diagnosis not present

## 2024-06-06 MED ORDER — SODIUM CHLORIDE 0.9 % IV SOLN: INTRAVENOUS | Status: DC

## 2024-06-06 MED ORDER — CETIRIZINE HCL 10 MG PO TABS
10.0000 mg | ORAL_TABLET | Freq: Once | ORAL | Status: AC
  Administered 2024-06-06: 10 mg via ORAL
  Filled 2024-06-06: qty 1

## 2024-06-06 MED ORDER — IRON SUCROSE 300 MG IVPB - SIMPLE MED
300.0000 mg | Freq: Once | Status: AC
  Administered 2024-06-06: 300 mg via INTRAVENOUS
  Filled 2024-06-06: qty 300

## 2024-06-06 NOTE — Progress Notes (Signed)
 Patient presents today for Venofer  infusion per providers order.  Vital signs WNL.  Patient has no new complaints at this time.  Peripheral IV started and blood return noted pre and post infusion.  Patient refusing to wait her post infusion wait time stating that she needed to get to work.  Patient understands the reason for the post wait time and still refuses to wait.  Stable during infusion without adverse affects.  Vital signs stable.  No complaints at this time.  Discharge from clinic ambulatory in stable condition.  Alert and oriented X 3.  Follow up with [facility] as scheduled.

## 2024-06-16 ENCOUNTER — Encounter: Payer: Self-pay | Admitting: *Deleted

## 2024-07-04 ENCOUNTER — Inpatient Hospital Stay

## 2024-07-11 ENCOUNTER — Inpatient Hospital Stay: Admitting: Oncology

## 2024-07-18 ENCOUNTER — Inpatient Hospital Stay

## 2024-07-23 ENCOUNTER — Inpatient Hospital Stay

## 2024-07-25 ENCOUNTER — Inpatient Hospital Stay: Admitting: Oncology

## 2024-08-29 ENCOUNTER — Inpatient Hospital Stay

## 2024-09-04 ENCOUNTER — Inpatient Hospital Stay: Admitting: Oncology
